# Patient Record
Sex: Female | Born: 1940 | Race: White | Hispanic: No | Marital: Married | Smoking: Never smoker
Health system: Southern US, Community
[De-identification: ages and names within clinical notes are randomized; demographics above are authoritative.]

## PROBLEM LIST (undated history)

## (undated) DIAGNOSIS — Z5189 Encounter for other specified aftercare: Secondary | ICD-10-CM

## (undated) DIAGNOSIS — I639 Cerebral infarction, unspecified: Secondary | ICD-10-CM

## (undated) DIAGNOSIS — D649 Anemia, unspecified: Secondary | ICD-10-CM

## (undated) DIAGNOSIS — K5790 Diverticulosis of intestine, part unspecified, without perforation or abscess without bleeding: Secondary | ICD-10-CM

## (undated) DIAGNOSIS — K219 Gastro-esophageal reflux disease without esophagitis: Secondary | ICD-10-CM

## (undated) DIAGNOSIS — N189 Chronic kidney disease, unspecified: Secondary | ICD-10-CM

## (undated) DIAGNOSIS — M1990 Unspecified osteoarthritis, unspecified site: Secondary | ICD-10-CM

## (undated) DIAGNOSIS — C801 Malignant (primary) neoplasm, unspecified: Secondary | ICD-10-CM

## (undated) DIAGNOSIS — Z85528 Personal history of other malignant neoplasm of kidney: Secondary | ICD-10-CM

## (undated) HISTORY — PX: COLONOSCOPY: SHX174

## (undated) HISTORY — PX: ABDOMINAL HYSTERECTOMY: SUR658

## (undated) HISTORY — DX: Gastro-esophageal reflux disease without esophagitis: K21.9

## (undated) HISTORY — PX: FOOT SURGERY: SHX648

## (undated) HISTORY — PX: CATARACT EXTRACTION, BILATERAL: SHX1313

## (undated) HISTORY — DX: Personal history of other malignant neoplasm of kidney: Z85.528

## (undated) HISTORY — PX: COLON SURGERY: SHX602

## (undated) HISTORY — DX: Diverticulosis of intestine, part unspecified, without perforation or abscess without bleeding: K57.90

## (undated) HISTORY — PX: HIP SURGERY: SHX245

## (undated) HISTORY — DX: Malignant (primary) neoplasm, unspecified: C80.1

## (undated) HISTORY — PX: OTHER SURGICAL HISTORY: SHX169

## (undated) HISTORY — DX: Encounter for other specified aftercare: Z51.89

## (undated) HISTORY — PX: POLYPECTOMY: SHX149

---

## 1981-06-14 DIAGNOSIS — C801 Malignant (primary) neoplasm, unspecified: Secondary | ICD-10-CM

## 1981-06-14 HISTORY — DX: Malignant (primary) neoplasm, unspecified: C80.1

## 1999-07-10 ENCOUNTER — Encounter: Payer: Self-pay | Admitting: Urology

## 1999-07-10 ENCOUNTER — Encounter: Admission: RE | Admit: 1999-07-10 | Discharge: 1999-07-10 | Payer: Self-pay | Admitting: Urology

## 1999-08-10 ENCOUNTER — Encounter: Payer: Self-pay | Admitting: Specialist

## 1999-08-10 ENCOUNTER — Encounter: Admission: RE | Admit: 1999-08-10 | Discharge: 1999-08-10 | Payer: Self-pay | Admitting: Specialist

## 2000-07-22 ENCOUNTER — Encounter: Admission: RE | Admit: 2000-07-22 | Discharge: 2000-07-22 | Payer: Self-pay | Admitting: Urology

## 2000-07-22 ENCOUNTER — Encounter: Payer: Self-pay | Admitting: Urology

## 2003-10-23 ENCOUNTER — Inpatient Hospital Stay (HOSPITAL_COMMUNITY): Admission: EM | Admit: 2003-10-23 | Discharge: 2003-10-26 | Payer: Self-pay

## 2004-08-19 ENCOUNTER — Encounter: Admission: RE | Admit: 2004-08-19 | Discharge: 2004-09-21 | Payer: Self-pay | Admitting: Family Medicine

## 2005-09-23 ENCOUNTER — Ambulatory Visit: Payer: Self-pay | Admitting: Gastroenterology

## 2005-09-23 ENCOUNTER — Ambulatory Visit: Payer: Self-pay | Admitting: Cardiology

## 2005-10-01 ENCOUNTER — Ambulatory Visit (HOSPITAL_COMMUNITY): Admission: RE | Admit: 2005-10-01 | Discharge: 2005-10-01 | Payer: Self-pay | Admitting: Gastroenterology

## 2005-10-05 ENCOUNTER — Ambulatory Visit: Payer: Self-pay | Admitting: Gastroenterology

## 2005-11-11 ENCOUNTER — Inpatient Hospital Stay (HOSPITAL_COMMUNITY): Admission: RE | Admit: 2005-11-11 | Discharge: 2005-11-19 | Payer: Self-pay | Admitting: Urology

## 2005-11-11 ENCOUNTER — Encounter (INDEPENDENT_AMBULATORY_CARE_PROVIDER_SITE_OTHER): Payer: Self-pay | Admitting: Specialist

## 2005-11-26 ENCOUNTER — Encounter (INDEPENDENT_AMBULATORY_CARE_PROVIDER_SITE_OTHER): Payer: Self-pay | Admitting: *Deleted

## 2005-11-26 ENCOUNTER — Ambulatory Visit (HOSPITAL_COMMUNITY): Admission: RE | Admit: 2005-11-26 | Discharge: 2005-11-26 | Payer: Self-pay | Admitting: Surgery

## 2005-12-08 ENCOUNTER — Encounter: Admission: RE | Admit: 2005-12-08 | Discharge: 2005-12-08 | Payer: Self-pay | Admitting: Surgery

## 2006-08-23 ENCOUNTER — Ambulatory Visit (HOSPITAL_BASED_OUTPATIENT_CLINIC_OR_DEPARTMENT_OTHER): Admission: RE | Admit: 2006-08-23 | Discharge: 2006-08-23 | Payer: Self-pay | Admitting: Orthopedic Surgery

## 2007-12-18 ENCOUNTER — Ambulatory Visit: Payer: Self-pay | Admitting: Vascular Surgery

## 2009-01-02 ENCOUNTER — Ambulatory Visit: Payer: Self-pay | Admitting: Gastroenterology

## 2009-01-22 ENCOUNTER — Ambulatory Visit: Payer: Self-pay | Admitting: Gastroenterology

## 2010-01-20 LAB — HM PAP SMEAR: HM Pap smear: NORMAL

## 2010-05-27 LAB — HM MAMMOGRAPHY: HM Mammogram: NORMAL

## 2010-07-16 NOTE — Procedures (Signed)
Summary: Colonoscopy   Colonoscopy  Procedure date:  01/22/2009  Findings:      Location:  [facility].    Procedures Next Due Date:    Colonoscopy: 01/2014 COLONOSCOPY PROCEDURE REPORT  PATIENT:  [name], [name]  MR#:  [id number] BIRTHDATE:   January 15, 1941, 68 yrs. old   GENDER:   female  ENDOSCOPIST:   Vania Rea. Jarold Motto, MD, [facility] Referred by:   PROCEDURE DATE:  01/22/2009 PROCEDURE:  Colonoscopy, diagnostic ASA CLASS:   Class II INDICATIONS: history of polyps PRIOR SIGMOID RESECTION FOR RECURRENT DIVERTICULITIS.  MEDICATIONS:    Fentanyl 50 mcg IV, Versed 5 mg IV  DESCRIPTION OF PROCEDURE:   After the risks benefits and alternatives of the procedure were thoroughly explained, informed consent was obtained.  Digital rectal exam was performed and revealed no abnormalities.   The LB [id number] endoscope was introduced through the anus and advanced to the cecum, which was identified by both the appendix and ileocecal valve, without limitations.  The quality of the prep was excellent, using MoviPrep.  The instrument was then slowly withdrawn as the colon was fully examined. <<PROCEDUREIMAGES>>        <<OLD IMAGES>>  FINDINGS:  Scattered diverticula were found in the sigmoid colon.  No polyps or cancers were seen.  This was otherwise a normal examination of the colon.   Retroflexed views in the rectum revealed hypertrophied anal papillae.    The scope was then withdrawn from the patient and the procedure completed.  COMPLICATIONS:   None  ENDOSCOPIC IMPRESSION:  1) Diverticula, scattered in the sigmoid colon  2) No polyps or cancers  3) Otherwise normal examination  4) Hypertrophied anal papillae RECOMMENDATIONS:  1) high fiber diet  2) Follow up colonoscopy in 5 years  REPEAT EXAM:   No   _______________________________ Vania Rea. Jarold Motto, MD, [facility]  CC: Harriette Bouillon, MDKevin Clarene Duke, MD

## 2010-07-16 NOTE — Miscellaneous (Signed)
Summary: LecPrevisit/prep  Clinical Lists Changes  Medications: Added new medication of MOVIPREP 100 GM  SOLR (PEG-KCL-NACL-NASULF-NA ASC-C) As per prep instructions. - Signed Rx of MOVIPREP 100 GM  SOLR (PEG-KCL-NACL-NASULF-NA ASC-C) As per prep instructions.;  #1 x 0;  Signed;  Entered by: Wyona Almas RN;  Authorized by: Mardella Layman MD [facility];  Method used: Electronically to [facility] [id number] [place], [place], [place]  [address], Ph: [phone], Fax: [phone] Observations: Added new observation of NKA: T (01/02/2009 13:12)    Prescriptions: MOVIPREP 100 GM  SOLR (PEG-KCL-NACL-NASULF-NA ASC-C) As per prep instructions.  #1 x 0   Entered by:   Wyona Almas RN   Authorized by:   Mardella Layman MD [facility]   Signed by:   Wyona Almas RN on 01/02/2009   Method used:   Electronically to        [facility] [phone]* (retail)       [address] [place], [place]  [address]       Ph: [phone]       Fax: [phone]   RxID:   [id number]

## 2010-10-20 ENCOUNTER — Telehealth: Payer: Self-pay | Admitting: Gastroenterology

## 2010-10-20 NOTE — Telephone Encounter (Signed)
Pt with hx of Diverticulitis and Sigmoid Resection r/t Diverticulitis 11/11/2005. Last COLON 01/22/2009. Pt reports left side abdominal pain that started last Thur. or Fri. Pain is not as bad today, but she had a good BM and she doesn't know if her problem has gone away. Suggested to pt she needs to be seen d/t her hx . Pt will see Willette Cluster, NP tomorrow at 09:00am.

## 2010-10-21 ENCOUNTER — Ambulatory Visit (INDEPENDENT_AMBULATORY_CARE_PROVIDER_SITE_OTHER): Payer: Medicare Other | Admitting: Nurse Practitioner

## 2010-10-21 ENCOUNTER — Encounter: Payer: Self-pay | Admitting: Nurse Practitioner

## 2010-10-21 DIAGNOSIS — Z8619 Personal history of other infectious and parasitic diseases: Secondary | ICD-10-CM

## 2010-10-21 DIAGNOSIS — R1032 Left lower quadrant pain: Secondary | ICD-10-CM

## 2010-10-21 DIAGNOSIS — R109 Unspecified abdominal pain: Secondary | ICD-10-CM

## 2010-10-21 DIAGNOSIS — Z8719 Personal history of other diseases of the digestive system: Secondary | ICD-10-CM | POA: Insufficient documentation

## 2010-10-21 DIAGNOSIS — R1031 Right lower quadrant pain: Secondary | ICD-10-CM | POA: Insufficient documentation

## 2010-10-21 MED ORDER — SACCHAROMYCES BOULARDII 250 MG PO CAPS: 250.0000 mg | ORAL_CAPSULE | Freq: Every day | ORAL | Status: DC

## 2010-10-21 MED ORDER — CIPROFLOXACIN HCL 500 MG PO TABS: 500.0000 mg | ORAL_TABLET | Freq: Two times a day (BID) | ORAL | Status: AC

## 2010-10-21 MED ORDER — METRONIDAZOLE 500 MG PO TABS: 500.0000 mg | ORAL_TABLET | Freq: Three times a day (TID) | ORAL | Status: AC

## 2010-10-21 NOTE — Patient Instructions (Signed)
We have sent the prescription for Cipro, Flagyl, and Florastor to [facility]. Call us in 10 days with a progress report.

## 2010-10-21 NOTE — Progress Notes (Signed)
10/21/2010 [name] [id number] 1940-11-12   History of Present Illness:  [name] is a 70 year old female known to Dr. Jarold Motto for family history of colorectal cancer, she had a colon resection for recurrent diverticulitis in 2007 and her postoperative course was complicated by C-Difficile colitis. She also has a history of renal carcinoma status post right nephrectomy. Patient was last seen at the time of her surveillance colonoscopy August 2010 which of note was an unremarkable exam except for some scattered sigmoid diverticula. Her surveillance exam will be in 2015.  Patient was worked in today after she called the office 10/20/2010 with a several day history of left lower abdominal pain which has since become more diffuse across her lower abdomen.  Sunday was her worst day .Pain not quite as severe over last two days but she still hurts. Laying down helps  Doubts fever. No nausea or vomiting. Bowel movements are normal. She uses Citrucels and prunes and that keeps her regular. No urinary symptoms.     Past Medical History  Diagnosis Date  . Personal history of kidney cancer   . Diverticulosis    Past Surgical History  Procedure Date  . Hip surgery   . Abdominal hysterectomy   . Kidney cancer     Kidney removed  . Colon surgery     Severe sigmoid diverticulitis  . Foot surgery     reports that she has never smoked. She does not have any smokeless tobacco history on file. She reports that she does not drink alcohol. Her drug history not on file. family history includes Coronary artery disease in her father. No Known Allergies      Outpatient Encounter Prescriptions as of 10/21/2010  Medication Sig Dispense Refill  . aspirin 81 MG tablet Take 81 mg by mouth daily.        Marland Kitchen CALCIUM & MAGNESIUM CARBONATES PO Take by mouth. 2 daily        . estradiol (VIVELLE-DOT) 0.05 MG/24HR Place 1 patch onto the skin once a week.        Marland Kitchen glucosamine-chondroitin 500-400 MG tablet Take 1  tablet by mouth 2 (two) times daily.        . Multiple Vitamins-Minerals (MULTIVITAMIN WITH MINERALS) tablet Take 1 tablet by mouth daily.           ROS: All other systems reviewed and negative except where noted in the History of Present Illness.   Physical Exam: General: Well developed, white female in no acute distress Head: Normocephalic and atraumatic Eyes:  sclerae anicteric,conjunctive pink. Ears: Normal auditory acuity Mouth: No deformity or lesions Neck: Supple, no masses.  Lungs: Clear throughout to auscultation Heart: Regular rate and rhythm; no murmurs heard Abdomen: Soft, non distended, mild diffuse lower abdominal tenderness. No masses or hepatomegaly noted. Normal Bowel sounds Rectal: Not done Musculoskeletal: Symmetrical with no gross deformities  Skin: No lesions on visible extremities Extremities: No edema or deformities noted Neurological: Alert oriented x 4, grossly nonfocal Cervical Nodes:  No significant cervical adenopathy Psychological:  Alert and cooperative. Normal mood and affect  Assessment and Plan:  1.  Lower abdominal pain: Several day history of lower abdominal pain, initially left lower quadrant, now more diffuse across the lower abdomen. Pain reminiscent of diverticulitis for which she is status post partial colectomy 2007. While the patient has minimal lower abdominal tenderness on exam, I am concerned that this may be recurrent diverticulitis. Given her history of C. difficile colitis I have some concerns about  treating her empirically with antibiotics. Patient prefers to avoid a CT scan at this time for financial reasons. We did discuss the possibility of recurrent C. difficile colitis but at this point [name] would like to proceed with treatment. She'll be treated with a ten-day course of Flagyl and Cipro. Also recommended 30 days of Florastor. Patient will call us in 10 days with a condition update, or if sooner should pain worsen or she  develops fevers.

## 2010-10-22 NOTE — Progress Notes (Signed)
Agree with Ms. Guenther's assessment and plan. Leonila Speranza E. Jilliann Subramanian, MD, FACG   

## 2010-10-27 NOTE — Procedures (Signed)
LOWER EXTREMITY VENOUS REFLUX EXAM   INDICATION:  Left leg varicose vein with pain and bleeding.   EXAM:  Using color-flow imaging and pulse Doppler spectral analysis, the  left common femoral, superficial femoral, popliteal, posterior tibial,  greater and lesser saphenous veins are evaluated.  There is no evidence  suggesting deep venous insufficiency in the left lower extremity.   The left saphenofemoral junction is competent.  The left GSV is  competent with the caliber as described below.   The left proximal short saphenous vein demonstrates competency.   GSV Diameter (used if found to be incompetent only)                                            Right    Left  Proximal Greater Saphenous Vein           cm       cm  Proximal-to-mid-thigh                     cm       cm  Mid thigh                                 cm       cm  Mid-distal thigh                          cm       cm  Distal thigh                              cm       cm  Knee                                      cm       cm   IMPRESSION:  1. Left greater saphenous vein shows no evidence of reflux.  2. The left greater saphenous vein is not aneurysmal.  3. The left greater saphenous vein is not tortuous.  4. The deep venous system is competent.  5. The left lesser saphenous vein is competent.  6. No evidence of deep venous thrombosis noted in the left leg.   ___________________________________________  Quita Skye. Hart Rochester, M.D.   MG/MEDQ  D:  12/18/2007  T:  12/18/2007  Job:  [id number]

## 2010-10-27 NOTE — Consult Note (Signed)
VASCULAR SURGERY CONSULTATION   JUANISHA, BAUTCH  DOB:  01-06-1941                                       12/18/2007  FTDDU#:[id number]   The patient is a 70 year old female referred for history of a bleeding  varix in the left ankle in the past.  She states that about 1 year ago  while scratching her ankle, significant bleeding occurred from a small  varicose vein near the ankle area and this eventually stopped with some  compression and elevation of the leg.  She has no history of further  bleeding and has had no stasis ulcers, deep venous thrombosis,  thrombophlebitis, pulmonary emboli or other venous problems.  She denies  distal edema and has some mild aching discomfort in the calves  periodically.   PAST MEDICAL HISTORY:  Negative for coronary artery disease, diabetes,  hypertension, COPD or stroke.   PAST SURGICAL HISTORY:  1. Hip surgery.  2. Hysterectomy.  3. Removal of kidney for cancer.  4. Colon surgery.  5. Foot surgery.  She was no more specific than that.   FAMILY HISTORY:  Positive for coronary artery disease in her father.  Negative for diabetes and stroke.   ALLERGIES:  None known.   SOCIAL HISTORY:  Negative for tobacco, alcohol.   PHYSICAL EXAM:  Blood pressure 128/68, heart rate 72, respirations 14.  Generally she is a female patient who is in no apparent distress.  Alert  and oriented x3.  Neck is supple 3+ carotid pulses palpable.  No bruits  are audible.  Neurologic:  Normal.  No palpable adenopathy in the neck.  Chest:  Clear to auscultation.  Cardiovascular:  Regular rhythm with no  murmurs.  Abdomen:  Soft, nontender with no palpable masses.  She has 3+  femoral popliteal, and dorsalis pedis pulses bilaterally.  Left leg has  some diffuse spider veins bilaterally, particularly in the left lateral  thigh with some small varicosities in the medial calf over the greater  saphenous system.  There is a prominent reticular and spider  vein near  the left medial malleolus where the bleeding occurred.  There is no  hyperpigmentation or ulceration noted.  Right leg has some spider veins  along the distal lateral thigh and medial calf area.  No distal edema is  noted in either leg.   Venous duplex exam was performed on the left leg which revealed the deep  system to be normal.  There is no reflux in the small saphenous or great  saphenous vein.  There is a small incompetent perforator communicating  with the varix, which bled 1 year ago near the ankle.   I think her best treatment would be sclerotherapy of this small  varicosity which bled in the past.  I have discussed this with her and  if she would like to proceed with this, she will be in touch with [place] for  arranging that treatment.   Quita Skye Hart Rochester, M.D.  Electronically Signed  JDL/MEDQ  D:  12/18/2007  T:  12/19/2007  Job:  [id number]   cc:   Caryn Bee L. Little, M.D.

## 2010-10-30 NOTE — H&P (Signed)
[name], [name]                          ACCOUNT NO.:  [id number]   MEDICAL RECORD NO.:  [id number]                   PATIENT TYPE:  INP   LOCATION:  0104                                 FACILITY:  [facility]   PHYSICIAN:  Vania Rea. Jarold Motto, M.D. [facility]        DATE OF BIRTH:  Apr 05, 1941   DATE OF ADMISSION:  10/23/2003  DATE OF DISCHARGE:                                HISTORY & PHYSICAL   CHIEF COMPLAINT:  Acute abdominal pain post colonoscopy with subacute  abdominal pain x3-4 months.   HISTORY:  [name] is a very nice, 70 year old white female, primary patient  of Dr. Catha Gosselin who was referred to Dr. Jarold Motto for a colon screening  with a family history of colon cancer in her mother.  The patient had also  been having some intermittent left lower quadrant pain and gas over the past  several months. She said she initially had an episode around Christmas time  with some sharp, lower abdominal pains radiating towards her vagina.  She  says that she took laxatives at that time and eventually her pain improved  and then she had a recurrence a few weeks ago with left lower quadrant  abdominal pain.  Again, this seemed to resolve without any specific  treatment; and, at the time this morning, prior to colonoscopy she was not  having any abdominal discomfort and said that she had been feeling okay for  the past couple of weeks.  Her bowel movements have been fairly normal with  tendency towards constipation.  She has not noted any melena or  hematochezia, not had any fever or chills. Her appetite has been fine and  her weight has been stable.   At colonoscopy she was found to have marked diverticular disease with  evidence of diverticulitis in the left colon.  She had a small right colon  polyp excised as well.  Postprocedure she is complaining of severe abdominal  discomfort rather diffusely and is therefore, evaluated in the ER and then  admitted to the hospital with concerns for  acute diverticulitis.   CURRENT MEDICATIONS:  1. Aspirin 81 mg p.o. daily.  2. Premarin 0.625 daily.  3. Clorazepate 7.5 p.r.n.  4. Multivitamin daily.  5. Calcium 600 daily.  6. Folic acid 400 daily.  7. Glucosamine b.i.d.   ALLERGIES:  No known drug allergies.   PAST MEDICAL HISTORY:  1. The patient is status post right nephrectomy 23 years ago for a renal     cell carcinoma.  Surgery was done per Dr. Vonita Moss.  2. Status post remote left hip fracture at age 70.  3. Status post total hysterectomy in the 26s.  4. Status post bilateral tubal ligation.   FAMILY HISTORY:  Pertinent for colon cancer in the patient's mother,  diagnosed in her 43s.   SOCIAL HISTORY:  The patient is retired, has been married for 45 years.  No  tobacco and no EtOH .  REVIEW OF SYSTEMS:  CARDIOVASCULAR:  Negative for chest pain or anginal  symptoms. PULMONARY:  Negative for cough, shortness of breath or sputum  production.  GENITOURINARY:  Denies any dysuria, urgency or frequency.  GI:  As outlined above.   PHYSICAL EXAMINATION:  GENERAL:  A well-developed, white female. She is  uncomfortable, but in no acute distress. At the time of my evaluation.  VITAL SIGNS:  Temperature is 97, blood pressure 147/68.  Pulse is 55,  respirations 28.  HEENT:  Nontraumatic, normocephalic.  EOMI.  PERLA.  Sclerae anicteric.  CARDIOVASCULAR:  Regular rate and rhythm with S1 and S2, no murmur, rub, or  gallop.  PULMONARY:  Clear to A&P.  ABDOMEN:  Soft.  Bowel sounds are active.  She is currently tender in the  left midquadrant, left lower quadrant and suprapubic area.  There is no  guarding or rebound.  No mass or hepatosplenomegaly.  RECTAL:  Exam not done.  Patient had colonoscopy this a.m.  EXTREMITIES:  Without clubbing, cyanosis, or edema.  NEUROLOGIC:  Grossly nonfocal.   LABORATORY STUDIES:  Pending.   IMPRESSION:  72. A 70 year old white female with acute and subacute abdominal pain, post      colonoscopy this morning with finding of diverticulitis and the single     polyp which was excised from the right colon.  It is felt that her pain     is most consistent with acute diverticulitis; also rule out free air.  2. Status post right nephrectomy, remote, for renal cell carcinoma.  3. Status post total abdominal hysterectomy.  4. Status post left hip fracture.   PLAN:  The patient is admitted to the service of Dr. Sheryn Bison for IV  fluid hydration, baseline labs. She will be covered with IV Cipro and IV  Flagyl.  We will obtain plain abdominal films and then CT scan of the  abdomen and pelvis today; for further details, please see the orders.     Mike Gip, P.A.-C. [facility]                Vania Rea. Jarold Motto, M.D. [facility]    AE/MEDQ  D:  10/23/2003  T:  10/23/2003  Job:  [id number]   cc:   Caryn Bee L. Little, M.D.  [address]  [place]  [place] [address]  Fax: [phone]

## 2010-10-30 NOTE — Discharge Summary (Signed)
[name], [name]                ACCOUNT NO.:  [id number]   MEDICAL RECORD NO.:  [id number]          PATIENT TYPE:  INP   LOCATION:  1515                         FACILITY:  [facility]   PHYSICIAN:  Maisie Fus A. Cornett, M.D.DATE OF BIRTH:  [birth year]   DATE OF ADMISSION:  11/11/2005  DATE OF DISCHARGE:  11/19/2005                                 DISCHARGE SUMMARY   ADMISSION DIAGNOSIS:  Diverticulitis   DISCHARGE DIAGNOSIS:  Same  plus Clostridium diff colitis.   BRIEF HISTORY:  The patient is a 69 year old white female with a history of  multiple bouts of diverticulitis.  She was admitted for elective sigmoid  colectomy.   The patient underwent a sigmoid left colectomy laparoscopic assisted on  11/11/05.  She was admitted to the hospital.  Her postoperative course was  complicated by urinary retention requiring placement of a Foley catheter on  postoperative day #3.  She also had diarrhea and fever, elevated white count  consistent with C. diff colitis. She was placed on Flagyl at this point in  time.  She also had a wound infection .  After these  issues resolved, she  did well.  Her ileus resolved and  diarrhea improved.  She was discharged  home on 11/19/05 in satisfactory conditionon ten days of flagyl and wet to  dry dressing changes for her wound.   DISCHARGE INSTRUCTIONS:  The patient will take a total of  10 days of Flagyl  500  mg p.o. t.i.d.  She will be given Tylox for pain.  Will follow up with  me in 1 to 2 weeks.  She will continue with wet to dry dressing changes to  her midline incision.  Her diet will be as before.  Discharged satisfactory.      Thomas A. Cornett, M.D.  Electronically Signed     TAC/MEDQ  D:  12/08/2005  T:  12/08/2005  Job:  [id number]

## 2010-10-30 NOTE — Discharge Summary (Signed)
[name], [name]                          ACCOUNT NO.:  [id number]   MEDICAL RECORD NO.:  [id number]                   PATIENT TYPE:  INP   LOCATION:  0454                                 FACILITY:  [facility]   PHYSICIAN:  [name]. [name], M.D. [facility]             DATE OF BIRTH:  1940/08/08   DATE OF ADMISSION:  10/23/2003  DATE OF DISCHARGE:  10/26/2003                                 DISCHARGE SUMMARY   ADMISSION DIAGNOSES:  28. A 70 year old white female with acute and subacute abdominal pain status     post colonoscopy on the morning of admission with finding of     diverticulitis and a single polyp of the right colon which was removed.     Pain felt secondary to acute diverticulitis rule out colonic perforation.  2. Status post right nephrectomy remote for renal cell cancer.  3. Status post total abdominal hysterectomy.  4. Status post left hip fracture.   DISCHARGE DIAGNOSES:  1. Acute sigmoid diverticulitis improving.  2. Small volume hematochezia secondary to above.  3. Status post right nephrectomy remote for renal cell cancer.  4. Status post total abdominal hysterectomy.  5. Status post left hip fracture.   PROCEDURES:  CT scan of the abdomen and pelvis and plain abdominal films.   BRIEF HISTORY:  [name] is a very nice 70 year old white female, a primary  patient of Dr. Catha [name], who was referred to Dr. Sheryn [name] for  colon screening given family history of colon cancer in her mother.  She had  also been having some intermittent left lower quadrant pain and gassiness  over the past couple of months.  She said initially she had an episode  around Christmas time with some sharp lower abdominal pains radiating  towards her vagina.  She says she had taken laxatives and eventually her  pain improved and then she had a recurrence a few weeks ago this time with  left lower quadrant abdominal pain.  This again seemed to resolve without  any specific treatment and prior to  colonoscopy today she was not having any  acute abdominal discomfort.  She said her bowel movements had been fairly  normal and had not noted any melena or hematochezia.   At colonoscopy, she was found to have a marked diverticular disease with  evidence of diverticulitis in the left colon.  She also had a small right  colon polyp which was removed.  Postprocedure, she was complaining of severe  abdominal pain rather diffusely and therefore was sent to the emergency room  for evaluation and admission with concerns with acute diverticulitis and  also to rule out free air.   LABORATORY STUDIES:  On May 11th WBCs 6.6, hemoglobin 13.3, hematocrit of  39.5, MCV of 88.4, platelets 332.  Serial CBCs were done.  On the 14th, WBCs  6.2, hemoglobin 12, hematocrit of 35.2, sed rate was 14, pro time  11.4, INR  of 0.8, electrolytes within normal limits.  Glucose was 121 on admission.  Albumin 3.5.  Liver function studies normal.  Urinalysis negative.  EKG  showed a sinus bradycardia with a pulse of 56, otherwise unremarkable.   KUB on admission showed no ileus, obstruction, or free air.  Chest x-ray  negative.  CT scan of the abdomen and pelvis on May 11th status post  hysterectomy, some pericolic stranding in the rectosigmoid junction  consistent with mild diverticulitis, small amount of free fluid in the  pelvic cul-de-sac, no sign of abscess or free air.   HOSPITAL COURSE:  The patient was admitted to the service of Dr. Sheryn [name].  She was initially kept NPO, placed on IV fluids, given Demerol  and Phenergan as needed for control of pain and started on IV Cipro and IV  Flagyl.  Plain abdominal films were negative for free air and CT scan was  most consistent with a sigmoid diverticulitis.  She passed a small amount of  bright red blood on the day of admission.  It was felt this was likely due  to the diverticulitis and irritation with intubation of the colon.  She had  a couple of other  somewhat bloody stools later in the evening.  By the  following day, her symptoms were improved.  Pain was much better and she was  continued on antibiotics.  We began advancing her diet slowly.  On the 13th,  she remained stable.  Had passed a [name] mucousy blood again but hemoglobin  remained stable.  By May 14th, again she was feeling much better, had no  further complaints of abdominal pain, had had a more normal-appearing stool  and was feeling much more comfortable.  She was allowed discharge to home  with instructions to continue Cipro 500 mg b.i.d. for 10 days, Flagyl 250 mg  q.i.d. for 10 days, Darvocet-N 100 one every 4 hours as needed for pain.  She was asked to avoid aspirin and aspirin products and NSAIDS for the next  2 weeks, to resume her other medications as previous which included Premarin  0.625 daily, clorazepate 7.5 mg p.r.n., a multivitamin, calcium and folic  acid supplements, and was asked to maintain a low-residue diet for 2 weeks  or full liquid diet for 24 more hours and then soft, low-residue diet for 2  weeks.  She as asked to avoid strenuous exercise or heavy lifting and to  follow up with Dr. Jarold [name] in the office on Friday, May 20th, at 9:45 a.m.  She was to call in the interim for any problems.     [name], P.A.-C. [facility]                [name], M.D. [facility]    AE/MEDQ  D:  11/14/2003  T:  11/15/2003  Job:  [id number]   cc:   [name], M.D. [facility]   [name], M.D.  [address]  [place]  [place] [address]  Fax: [phone]

## 2010-10-30 NOTE — Op Note (Signed)
[name], [name]                ACCOUNT NO.:  [id number]   MEDICAL RECORD NO.:  [id number]          PATIENT TYPE:  AMB   LOCATION:  DSC                          FACILITY:  [facility]   PHYSICIAN:  Leonides Grills, M.D.     DATE OF BIRTH:  [birth year]   DATE OF PROCEDURE:  08/23/2006  DATE OF DISCHARGE:                               OPERATIVE REPORT   PREOPERATIVE DIAGNOSES:  1. Left second metatarsalgia.  2. Left second hammertoe.  3. Left tight gastrocnemius.   POSTOPERATIVE DIAGNOSES:  1. Left second metatarsalgia.  2. Left second hammertoe.  3. Left tight gastrocnemius.   OPERATION:  1. Left second Weil metatarsal shortening osteotomy.  2. Stress x-rays, left foot.  3. Left gastrocnemius slide.  4. Left second toe metatarsophalangeal joint dorsal capsulotomy with      collateral release.  5. Left second toe proximal phalanx head resection.  6. Left second toe extensor digitorum brevis to extensor digitorum      longus tendon transfer.   ANESTHESIA:  General.   SURGEON:  Leonides Grills, MD   ASSISTANT:  Evlyn Kanner, PA-C.   ESTIMATED BLOOD LOSS:  Minimal.   TOURNIQUET TIME:  Approximately 40 minutes.   COMPLICATIONS:  None.   DISPOSITION:  Stable, to PR.   INDICATIONS:  This is a 70 year old female who has had longstanding left  forefoot pain that was interfering with her life to the point that she  could __________ and she consented and seen by the anesthesiologist.  Risks including infection, nerve or vessel injury, persistent pain,  worsening pain, prolonged recovery, stiffness, arthritis, cock-up toe  deformity, nonunion, malunion, hardware rotation, hardware failure and  prolonged recovery were all explained.  Questions were encouraged and  answered.   DESCRIPTION OF PROCEDURE:  The patient was brought to the operating room  and placed in a supine position.  After adequate general tube anesthesia  was administered as well as Ancef 1 g IV piggyback, the left  lower  extremity was then prepped and draped in a sterile manner with a  proximally placed thigh tourniquet.  We started procedure with a  longitudinal incision over the medial aspect of the gastrocnemius  musculotendinous junction.  Dissection was carried down through the skin  and hemostasis was obtained.  The fascia was opened in line with the  incision.  The conjoined region was then developed between the gastroc  and soleus.  Soft tissue was then elevated off the posterior aspect the  gastrocnemius.  Sural nerves were identified and protected posteriorly.  The gastrocnemius was then released with a curved Mayo scissors; this  had an excellent release of the tight gastroc.  The area was copiously  irrigated with normal saline.  Subcu was closed with 3-0 Vicryl.  Skin  was closed with 4-0 Monocryl as a subcuticular stitch.  Steri-Strips  were applied.  Limb was gravity-exsanguinated and tourniquet was  elevated to 290 mmHg.  A longitudinal incision on the dorsal aspect of  the left 2nd toe was then made.  Dissection was carried down through the  skin and hemostasis was obtained.  EDL and EDB tendons were identified  with the EDL tenotomized proximal and medial and brevis distolateral for  later transfer.  The distal aspect of the proximal phalanx was  skeletonized and the head was then removed with a rongeur followed by a  bone cutter; these cuts were made perpendicular to the long axis of the  proximal phalanx.  At the MTP joint, a dorsal capsulotomy with  collateral release was then performed with the 15 blade scalpel,  protecting the soft tissues both medially and laterally including  neurovascular structures.  We then placed a 0.062 K-wire antegrade  through middle and distal phalanx after the PIP joint area had been  copiously irrigated with normal saline.  We reduced the PIP joint and  then fired this retrograde into the proximal phalanx.  The MTP joint and  toe were held in a  reduced position.  We then plantarflexed the toe and  made an osteotomy parallel to plantar aspect of the foot through the  distal aspect of the 2nd metatarsal head as described by Gerhard Munch; this was  with stacked sagittal saw blades.  The head was then translated  approximately 3 mm proximally and then affixed with a 2.1-mm fully  threaded cortical set screw using a 1.1-mm drill hole, respectively.  This had excellent purchase and maintenance of the correction.  This was  countersunk.  The redundant bone wedge dorsally was trimmed off with a  rongeur.  Bone wax was applied to exposed bone surfaces.  The area was  copiously irrigated with normal saline.  The toe was then held in  reduced position and a 0.062 K-wire was then fired across the MTP joint.  This held the toe in excellent position.  We then transferred the EDB to  EPL tendon dorsally using 3-0 PDS stitch; this had an outstanding  repair.  The tourniquet was deflated and hemostasis was obtained.  Skin-  relieving incisions were made on either side of the K-wire.  Final  stress x-rays were obtained in AP and lateral planes showing no gross  motion across the fusion site, fixation in proper position and alignment  excellent as well.  The length of the metatarsal was excellent.  The toe  pinked up nicely.  K-wires were bent, cut and capped.  Again, wounds  were copiously with normal saline.  Skin was closed with 4-0 nylon  stitch.  A sterile dressing was applied and a  Cam walker boot was  applied.  The patient was stable to the PR.   POSTOPERATIVE COURSE:  The patient will follow up in 2 weeks.  At that  time, we will remove the dressing as well as suture.  She will then go  into a hard sole shoe and heel weightbear for an additional 4 weeks.  At  that point, the K-wire will be removed from the 2nd toe and then she  will be advanced weightbearing as tolerated in the hard sole shoe for 2 additional weeks.  At 2 months postop, we will  have her go into a normal  shoe.  Once the sutures are removed from the 2nd toe, she is to perform  skin mobilization over the dorsal aspect of the 2nd toe and once the K-  wires are removed from the 2nd toe, she is to undergo plantarflexion  stretching exercises to the toe as well.  Elevation is encouraged.  She  is to perform pin care with a Q-tip and peroxide b.i.d.  Leonides Grills, M.D.  Electronically Signed     PB/MEDQ  D:  08/23/2006  T:  08/25/2006  Job:  [id number]

## 2010-10-30 NOTE — Op Note (Signed)
[name], [name]                ACCOUNT NO.:  [id number]   MEDICAL RECORD NO.:  [id number]          PATIENT TYPE:  INP   LOCATION:  1515                         [facility]ACILITY:  [facility]   PHYSICIAN:  [name], M.D.DATE OF BIRTH:  [birth year]   DATE OF PROCEDURE:  11/11/2005  DATE OF DISCHARGE:                                 OPERATIVE REPORT   PREOPERATIVE DIAGNOSIS:  Severe sigmoid diverticulitis.   POSTOPERATIVE DIAGNOSIS:  Severe sigmoid diverticulitis.   PROCEDURE:  1.  Laparoscopic assisted sigmoid colectomy.  2.  Take down of splenic flexure.   SURGEON:  [name], M.D.   ASSISTANT:  [name], M.D.   ANESTHESIA:  General endotracheal anesthesia.   DRAINS:  None.   ESTIMATED BLOOD LOSS:  150 mL.   SPECIMEN:  Sigmoid colon to pathology.   INDICATIONS FOR PROCEDURE:  The patient is a 70 year old female who has had  severe bouts of diverticulitis.  These have been coming quite frequently and  she has had numerous attacks since Christmas.  After a discussion with the  patient about her options, surgery was recommended.  She has had more than  four attacks, and two in the last six months have caused significant  treatment.  She wanted to have this done and had significant sigmoid  diverticulosis and diverticulitis on her barium enema.  I discussed the  procedure with her, its potential complications of bleeding, infection,  bowel injury, anastomotic leak, potential for ostomy, damage to ureter and  kidney.  She understood and agreed to proceed.   DESCRIPTION OF PROCEDURE:  The patient was brought to the operating room and  placed supine.  After induction of general endotracheal anesthesia, the  abdomen is prepped and draped in a sterile fashion.  She is placed in  lithotomy.  A Foley catheter and orogastric tube were both placed without  difficulty.  She received preoperative antibiotics.  Care was taken to place  the patient in lithotomy with no  pressure on the joints or knees.  Next, a  supraumbilical incision was made after sterile prep and drape.  Dissection  was carried to her fascia, the fascia was grasped with Kochers and opened  with a scalpel blade.  I placed my finger into the preperitoneal space and  swept around.  I did not feel any adhesions.  A pursestring suture of 0  Vicryl was placed and an 11-mm Hassan cannula was placed in direct vision.  Pneumoperitoneum was created 15 mmHg CO2.  The laparoscope was then placed.  Laparoscopy was performed.  She had no intra-abdominal adhesions from  previous surgery.  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  Next, I placed  three 5 mm ports, one in the epigastrium under direct vision, the second was  placed in the patient's midline between the umbilicus and pubic symphysis,  the third was placed in the patient's left lower quadrant.  With these  ports, I was then able to identify the colon.  There is significant chronic  inflammatory change and the sigmoid colon was very densely adherent to the  left pelvic sidewall.  I started above this area with the Harmonic scalpel  and began to mobilize left colon after rolling the patient to her right and  placing her neutral.  The white line of Toldt was mobilized with the  Harmonic scalpel.  I took this all the way up to the splenic flexure.  I  then mobilized the splenic flexure just above the spleen in an attempt to  gain length.  She had significant pelvic involvement of her sigmoid colon  and I figured we needed to have as much length as we needed to pull this  into the pelvis.  I was able to take the splenic flexure down carefully  using the Harmonic scalpel with care taken not to injure the colon or small  bowel or spleen in the process.  Once I had the splenic flexure well  mobilized, I then proceeded down toward the pelvis and mobilized the left  colon.  The sigmoid colon was densely adherent to the pelvic sidewall and  great  care was taken to mobilize this.  There was significant chronic  inflammatory change, but no evidence of any abscess or pus.  At this point,  I was able to mobilize the sigmoid colon off the pelvic sidewall and we were  able to see down in the pelvis and we were able to find the junction of the  rectosigmoid.  Once this was adequately mobilized, I felt the remainder of  the case could be done open.  At this point in time, the laparoscopic  instruments were removed.  The CO2 was released.  No irrigation was used and  the gutter and splenic region were found to be hemostatic.  At this point,  once all ports were subsequently removed, the CO2 was released.   Next, a lower midline incision was made that was initially about 7 cm  inbetween the pubic symphysis and the umbilicus.  Dissection was carried  down, the midline was found, the abdominal cavity was entered.  Once  entering the abdominal cavity, there was still significant pelvic  inflammatory change around the sigmoid colon.  I enlarged the incision about  2 cm in either direction.  I then used a Engineer, manufacturing and placed this.  The patient was then placed in Trendelenburg and rolled to her right.  I  packed out the small bowel with lap pads.  We were then able to see the  sigmoid colon.  We had very good mobilization of the proximal sigmoid,  however, in the pelvis there were still some areas that were densely  adhered.  We were able to examine the pelvis.  The area of the ureter was  examined carefully.  We were fairly far away from the ureter but I could  feel it and it appeared to be coursing over the iliac vessels well away from  where we were working.  At this point, we did a little bit more mobilization  of the sigmoid colon to bring it up into the wound better. Once we did this,  we identified her disease.  Most of her disease was distal to the rectosigmoid junction and was proximal to that for about 10 cm.  We found a  nice  soft segment of colon that was fairly diverticula free which was the  distal left colon.  With a GIA-75 stapling device, we divided the bowel.  A  Liga Sure was then used take the mesentery down toward the pelvis.  We  found  an area at the rectosigmoid junction which seemed soft and did not show any  signs of diverticula that was our end point.  We divided it with the GIA-75  stapler and passed the specimen off the field.  There was some bleeding from  the mesentery and this was oversewn with 3-0 Vicryl and 3-0 silk ties.  At  this point in time, an end-to-end two-layer anastomosis was created using an  outer layer of 3-0 silk pop offs and a running layer of 3-0 PDS.  We cut off  both staple lines and placed tacking sutures in the distal rectal stump.  We  then created our anastomosis in the above mentioned fashion.  There was  excellent blood supply from the bowel and no tension whatsoever on the  anastomosis.  Two layers were constructed.  There was an adequate lumen on  examination.  We reinspected the mesentery and irrigated out the pelvis and  suctioned this until clear.  There was no evidence any bleeding.  There was  no evidence of any bleeding coming from the left gutter or splenic flexure.  At this point in time, the packs were removed.  The patient was then  flattened out.  The umbilical fascia was closed with the previously present  placed pursestring suture.  I then closed the fascia with #1 PDS in a  running fashion after assuring the counts were correct.  Staples were used to close all skin incisions at this point in time.  The  wound was irrigated.  Dry dressings were applied.  All final counts of  sponge, needle and instruments were found to be correct.  The patient was  taken out of lithotomy, extubated, and taken to recovery in satisfactory  condition.      Thomas A. [name], M.D.  Electronically Signed     TAC/MEDQ  D:  11/11/2005  T:  11/11/2005  Job:  [id number]    cc:   Vania Rea. Jarold Motto, M.D. [facility]  [address]  [place]  [place] [address]   Anna Genre. Little, M.D.  Fax: [phone]

## 2010-11-04 ENCOUNTER — Telehealth: Payer: Self-pay | Admitting: *Deleted

## 2010-11-04 ENCOUNTER — Telehealth: Payer: Self-pay | Admitting: Nurse Practitioner

## 2010-11-04 NOTE — Telephone Encounter (Signed)
The patient has finished her antibiotics and is doing very well. She feels much better and wanted [place] to know.

## 2010-11-04 NOTE — Telephone Encounter (Signed)
Pam spoke to patient

## 2010-12-15 ENCOUNTER — Encounter: Payer: Self-pay | Admitting: Family Medicine

## 2014-01-25 ENCOUNTER — Encounter: Payer: Self-pay | Admitting: Internal Medicine

## 2014-01-30 ENCOUNTER — Encounter: Payer: Self-pay | Admitting: Internal Medicine

## 2014-03-14 ENCOUNTER — Ambulatory Visit (AMBULATORY_SURGERY_CENTER): Payer: Commercial Managed Care - HMO | Admitting: *Deleted

## 2014-03-14 VITALS — Ht 64.5 in | Wt 155.4 lb

## 2014-03-14 DIAGNOSIS — Z800 Family history of malignant neoplasm of digestive organs: Secondary | ICD-10-CM

## 2014-03-14 MED ORDER — MOVIPREP 100 G PO SOLR: 1.0000 | Freq: Once | ORAL | Status: DC

## 2014-03-14 NOTE — Progress Notes (Signed)
No cpap or home 02 use. ewm Pt states she had a colon with patterson that ended her up in hospital for 3 days with bleeding. Pt has hx of part of the colon being removed due to diverticulitis. ewm No diet pills. ewm No problems with past sedation. ewm emmi video to pt's e mail. ewm

## 2014-04-01 ENCOUNTER — Ambulatory Visit (AMBULATORY_SURGERY_CENTER): Payer: Commercial Managed Care - HMO | Admitting: Internal Medicine

## 2014-04-01 ENCOUNTER — Encounter: Payer: Self-pay | Admitting: Internal Medicine

## 2014-04-01 VITALS — BP 134/68 | HR 61 | Temp 96.2°F | Resp 21 | Ht 64.5 in | Wt 155.0 lb

## 2014-04-01 DIAGNOSIS — D122 Benign neoplasm of ascending colon: Secondary | ICD-10-CM

## 2014-04-01 DIAGNOSIS — D123 Benign neoplasm of transverse colon: Secondary | ICD-10-CM

## 2014-04-01 DIAGNOSIS — D125 Benign neoplasm of sigmoid colon: Secondary | ICD-10-CM

## 2014-04-01 DIAGNOSIS — Z1211 Encounter for screening for malignant neoplasm of colon: Secondary | ICD-10-CM

## 2014-04-01 DIAGNOSIS — Z800 Family history of malignant neoplasm of digestive organs: Secondary | ICD-10-CM

## 2014-04-01 MED ORDER — SODIUM CHLORIDE 0.9 % IV SOLN: 500.0000 mL | INTRAVENOUS | Status: DC

## 2014-04-01 NOTE — Progress Notes (Signed)
Report to PACU, RN, vss, BBS= Clear.  

## 2014-04-01 NOTE — Op Note (Signed)
[facility]  [address] [place], [address]   COLONOSCOPY PROCEDURE REPORT  PATIENT: [name], [name]  MR#: [id number] BIRTHDATE: 1941-01-29 , 32  yrs. old GENDER: female ENDOSCOPIST: Jerene Bears, MD PROCEDURE DATE:  04/01/2014 PROCEDURE:   Colonoscopy with snare polypectomy and Colonoscopy with cold biopsy polypectomy First Screening Colonoscopy - Avg.  risk and is 50 yrs.  old or older - No.  Prior Negative Screening - Now for repeat screening. N/A  History of Adenoma - Now for follow-up colonoscopy & has been > or = to 3 yrs.  N/A  Polyps Removed Today? Yes. ASA CLASS:   Class III INDICATIONS:patient's immediate family history of colon cancer and last colonoscopy completed  5  years ago. MEDICATIONS: Monitored anesthesia care and Propofol 200 mg IV  DESCRIPTION OF PROCEDURE:   After the risks benefits and alternatives of the procedure were thoroughly explained, informed consent was obtained.  The digital rectal exam revealed no rectal mass.   The LB [id number]-H190 [id number]  endoscope was introduced through the anus and advanced to the cecum, which was identified by both the appendix and ileocecal valve. No adverse events experienced. The quality of the prep was good, using MoviPrep  The instrument was then slowly withdrawn as the colon was fully examined.  COLON FINDINGS: Two sessile polyps ranging between 3-48mm in size were found in the transverse colon and ascending colon. Polypectomies were performed with a cold snare.  The resection was complete, the polyp tissue was completely retrieved and sent to histology.   There was evidence of a normal appearing prior surgical anastomosis in the sigmoid colon. At the anastomosis there was a small polyp, 3-4 mm,, query pseudopolyp. A polypectomy was performed with cold forceps.   There was mild diverticulosis noted in the descending colon and at the hepatic flexure.  Retroflexed views revealed small internal hemorrhoids  and hypertrophied anal papilla. The time to cecum=2 minutes 35 seconds.  Withdrawal time=12 minutes 19 seconds.  The scope was withdrawn and the procedure completed.  COMPLICATIONS: There were no immediate complications.      ENDOSCOPIC IMPRESSION: 1.  Two sessile polyps ranging between 3-75mm in size were found in the transverse colon and ascending colon; polypectomies were performed with a cold snare 2.   There was evidence of prior surgical anastomosis in the sigmoid colon with possible polyp; polypectomy was performed with cold forceps 3.   Mild diverticulosis was noted in the descending colon and at the hepatic flexure  RECOMMENDATIONS: 1.  Await pathology results 2.  High fiber diet 3.  Colace (docusate) 100-200 mg can be used daily as needed to help soften stools  eSigned:  Jerene Bears, MD 04/01/2014 10:03 AM   cc: The Patient and Hulan Fess, MD   PATIENT NAME:  [name], [name] MR#: [id number]

## 2014-04-01 NOTE — Patient Instructions (Signed)
Discharge instructions given with verbal understanding. Handouts on polyps and diverticulosis. Resume previous medications. YOU HAD AN ENDOSCOPIC PROCEDURE TODAY AT THE [facility]: Refer to the procedure report that was given to you for any specific questions about what was found during the examination.  If the procedure report does not answer your questions, please call your gastroenterologist to clarify.  If you requested that your care partner not be given the details of your procedure findings, then the procedure report has been included in a sealed envelope for you to review at your convenience later.  YOU SHOULD EXPECT: Some feelings of bloating in the abdomen. Passage of more gas than usual.  Walking can help get rid of the air that was put into your GI tract during the procedure and reduce the bloating. If you had a lower endoscopy (such as a colonoscopy or flexible sigmoidoscopy) you may notice spotting of blood in your stool or on the toilet paper. If you underwent a bowel prep for your procedure, then you may not have a normal bowel movement for a few days.  DIET: Your first meal following the procedure should be a light meal and then it is ok to progress to your normal diet.  A half-sandwich or bowl of soup is an example of a good first meal.  Heavy or fried foods are harder to digest and may make you feel nauseous or bloated.  Likewise meals heavy in dairy and vegetables can cause extra gas to form and this can also increase the bloating.  Drink plenty of fluids but you should avoid alcoholic beverages for 24 hours.  ACTIVITY: Your care partner should take you home directly after the procedure.  You should plan to take it easy, moving slowly for the rest of the day.  You can resume normal activity the day after the procedure however you should NOT DRIVE or use heavy machinery for 24 hours (because of the sedation medicines used during the test).    SYMPTOMS TO REPORT  IMMEDIATELY: A gastroenterologist can be reached at any hour.  During normal business hours, 8:30 AM to 5:00 PM Monday through Friday, call [phone].  After hours and on weekends, please call the GI answering service at [phone] who will take a message and have the physician on call contact you.   Following lower endoscopy (colonoscopy or flexible sigmoidoscopy):  Excessive amounts of blood in the stool  Significant tenderness or worsening of abdominal pains  Swelling of the abdomen that is new, acute  Fever of 100F or higher  FOLLOW UP: If any biopsies were taken you will be contacted by phone or by letter within the next 1-3 weeks.  Call your gastroenterologist if you have not heard about the biopsies in 3 weeks.  Our staff will call the home number listed on your records the next business day following your procedure to check on you and address any questions or concerns that you may have at that time regarding the information given to you following your procedure. This is a courtesy call and so if there is no answer at the home number and we have not heard from you through the emergency physician on call, we will assume that you have returned to your regular daily activities without incident.  SIGNATURES/CONFIDENTIALITY: You and/or your care partner have signed paperwork which will be entered into your electronic medical record.  These signatures attest to the fact that that the information above on your After Visit Summary   has been reviewed and is understood.  Full responsibility of the confidentiality of this discharge information lies with you and/or your care-partner. 

## 2014-04-01 NOTE — Progress Notes (Signed)
Called to room to assist during endoscopic procedure.  Patient ID and intended procedure confirmed with present staff. Received instructions for my participation in the procedure from the performing physician.  

## 2014-04-02 ENCOUNTER — Telehealth: Payer: Self-pay

## 2014-04-02 NOTE — Telephone Encounter (Signed)
  Follow up Call-  Call back number 04/01/2014  Post procedure Call Back phone  # [phone]  Permission to leave phone message Yes     Patient questions:  Do you have a fever, pain , or abdominal swelling? No. Pain Score  0 *  Have you tolerated food without any problems? Yes.    Have you been able to return to your normal activities? Yes.    Do you have any questions about your discharge instructions: Diet   No. Medications  No. Follow up visit  No.  Do you have questions or concerns about your Care? No.  Actions: * If pain score is 4 or above: No action needed, pain <4.

## 2014-04-09 ENCOUNTER — Encounter: Payer: Self-pay | Admitting: Internal Medicine

## 2014-04-18 ENCOUNTER — Telehealth: Payer: Self-pay | Admitting: Internal Medicine

## 2014-04-18 NOTE — Telephone Encounter (Signed)
Pt had questions regarding path letter. Discussed results with pt and her questions were answered.

## 2014-10-29 DIAGNOSIS — D3131 Benign neoplasm of right choroid: Secondary | ICD-10-CM | POA: Diagnosis not present

## 2014-10-29 DIAGNOSIS — H43813 Vitreous degeneration, bilateral: Secondary | ICD-10-CM | POA: Diagnosis not present

## 2014-10-29 DIAGNOSIS — H35412 Lattice degeneration of retina, left eye: Secondary | ICD-10-CM | POA: Diagnosis not present

## 2014-10-30 DIAGNOSIS — Z1231 Encounter for screening mammogram for malignant neoplasm of breast: Secondary | ICD-10-CM | POA: Diagnosis not present

## 2014-12-09 ENCOUNTER — Encounter: Payer: Self-pay | Admitting: Gastroenterology

## 2015-03-26 DIAGNOSIS — H31002 Unspecified chorioretinal scars, left eye: Secondary | ICD-10-CM | POA: Diagnosis not present

## 2015-03-26 DIAGNOSIS — H35372 Puckering of macula, left eye: Secondary | ICD-10-CM | POA: Diagnosis not present

## 2015-03-26 DIAGNOSIS — Z961 Presence of intraocular lens: Secondary | ICD-10-CM | POA: Diagnosis not present

## 2015-03-26 DIAGNOSIS — H35371 Puckering of macula, right eye: Secondary | ICD-10-CM | POA: Diagnosis not present

## 2015-03-26 DIAGNOSIS — D3131 Benign neoplasm of right choroid: Secondary | ICD-10-CM | POA: Diagnosis not present

## 2015-04-07 DIAGNOSIS — M25552 Pain in left hip: Secondary | ICD-10-CM | POA: Diagnosis not present

## 2015-04-10 DIAGNOSIS — N183 Chronic kidney disease, stage 3 (moderate): Secondary | ICD-10-CM | POA: Diagnosis not present

## 2015-04-10 DIAGNOSIS — E785 Hyperlipidemia, unspecified: Secondary | ICD-10-CM | POA: Diagnosis not present

## 2015-04-10 DIAGNOSIS — R8290 Unspecified abnormal findings in urine: Secondary | ICD-10-CM | POA: Diagnosis not present

## 2015-04-10 DIAGNOSIS — Z85528 Personal history of other malignant neoplasm of kidney: Secondary | ICD-10-CM | POA: Diagnosis not present

## 2015-04-10 DIAGNOSIS — Z0000 Encounter for general adult medical examination without abnormal findings: Secondary | ICD-10-CM | POA: Diagnosis not present

## 2015-04-10 DIAGNOSIS — Z86010 Personal history of colonic polyps: Secondary | ICD-10-CM | POA: Diagnosis not present

## 2015-04-10 DIAGNOSIS — N189 Chronic kidney disease, unspecified: Secondary | ICD-10-CM | POA: Diagnosis not present

## 2015-04-10 DIAGNOSIS — R7301 Impaired fasting glucose: Secondary | ICD-10-CM | POA: Diagnosis not present

## 2015-04-10 DIAGNOSIS — Z79899 Other long term (current) drug therapy: Secondary | ICD-10-CM | POA: Diagnosis not present

## 2015-04-14 DIAGNOSIS — M7632 Iliotibial band syndrome, left leg: Secondary | ICD-10-CM | POA: Diagnosis not present

## 2015-04-14 DIAGNOSIS — M7062 Trochanteric bursitis, left hip: Secondary | ICD-10-CM | POA: Diagnosis not present

## 2015-04-14 DIAGNOSIS — M25552 Pain in left hip: Secondary | ICD-10-CM | POA: Diagnosis not present

## 2015-04-16 DIAGNOSIS — M25552 Pain in left hip: Secondary | ICD-10-CM | POA: Diagnosis not present

## 2015-04-18 DIAGNOSIS — M7632 Iliotibial band syndrome, left leg: Secondary | ICD-10-CM | POA: Diagnosis not present

## 2015-04-18 DIAGNOSIS — M7062 Trochanteric bursitis, left hip: Secondary | ICD-10-CM | POA: Diagnosis not present

## 2015-04-18 DIAGNOSIS — M25552 Pain in left hip: Secondary | ICD-10-CM | POA: Diagnosis not present

## 2015-05-16 DIAGNOSIS — M7062 Trochanteric bursitis, left hip: Secondary | ICD-10-CM | POA: Diagnosis not present

## 2015-06-23 DIAGNOSIS — M7062 Trochanteric bursitis, left hip: Secondary | ICD-10-CM | POA: Diagnosis not present

## 2015-06-23 DIAGNOSIS — M25552 Pain in left hip: Secondary | ICD-10-CM | POA: Diagnosis not present

## 2015-07-07 DIAGNOSIS — M25552 Pain in left hip: Secondary | ICD-10-CM | POA: Diagnosis not present

## 2015-07-10 DIAGNOSIS — M25552 Pain in left hip: Secondary | ICD-10-CM | POA: Diagnosis not present

## 2015-07-14 DIAGNOSIS — M25552 Pain in left hip: Secondary | ICD-10-CM | POA: Diagnosis not present

## 2015-07-17 DIAGNOSIS — M25552 Pain in left hip: Secondary | ICD-10-CM | POA: Diagnosis not present

## 2015-07-21 DIAGNOSIS — M25552 Pain in left hip: Secondary | ICD-10-CM | POA: Diagnosis not present

## 2015-07-24 DIAGNOSIS — M25552 Pain in left hip: Secondary | ICD-10-CM | POA: Diagnosis not present

## 2015-07-31 DIAGNOSIS — M25552 Pain in left hip: Secondary | ICD-10-CM | POA: Diagnosis not present

## 2015-08-12 DIAGNOSIS — N941 Unspecified dyspareunia: Secondary | ICD-10-CM | POA: Diagnosis not present

## 2015-08-12 DIAGNOSIS — Z01419 Encounter for gynecological examination (general) (routine) without abnormal findings: Secondary | ICD-10-CM | POA: Diagnosis not present

## 2015-08-12 DIAGNOSIS — N951 Menopausal and female climacteric states: Secondary | ICD-10-CM | POA: Diagnosis not present

## 2015-10-06 ENCOUNTER — Other Ambulatory Visit: Payer: Self-pay

## 2015-10-06 DIAGNOSIS — Z1231 Encounter for screening mammogram for malignant neoplasm of breast: Secondary | ICD-10-CM

## 2015-10-28 DIAGNOSIS — H35412 Lattice degeneration of retina, left eye: Secondary | ICD-10-CM | POA: Diagnosis not present

## 2015-10-28 DIAGNOSIS — H31009 Unspecified chorioretinal scars, unspecified eye: Secondary | ICD-10-CM | POA: Diagnosis not present

## 2015-10-28 DIAGNOSIS — H35373 Puckering of macula, bilateral: Secondary | ICD-10-CM | POA: Diagnosis not present

## 2015-10-28 DIAGNOSIS — H43813 Vitreous degeneration, bilateral: Secondary | ICD-10-CM | POA: Diagnosis not present

## 2015-10-28 DIAGNOSIS — D3131 Benign neoplasm of right choroid: Secondary | ICD-10-CM | POA: Diagnosis not present

## 2015-11-03 DIAGNOSIS — H26492 Other secondary cataract, left eye: Secondary | ICD-10-CM | POA: Diagnosis not present

## 2015-11-07 ENCOUNTER — Ambulatory Visit
Admission: RE | Admit: 2015-11-07 | Discharge: 2015-11-07 | Disposition: A | Discharge status: Home / Self Care | Payer: PPO | Source: Ambulatory Visit

## 2015-11-07 DIAGNOSIS — Z1231 Encounter for screening mammogram for malignant neoplasm of breast: Secondary | ICD-10-CM | POA: Diagnosis not present

## 2015-11-19 ENCOUNTER — Other Ambulatory Visit: Payer: Self-pay | Admitting: Family Medicine

## 2015-11-19 DIAGNOSIS — Z961 Presence of intraocular lens: Secondary | ICD-10-CM | POA: Diagnosis not present

## 2015-11-19 DIAGNOSIS — H43812 Vitreous degeneration, left eye: Secondary | ICD-10-CM | POA: Diagnosis not present

## 2015-11-19 DIAGNOSIS — H31002 Unspecified chorioretinal scars, left eye: Secondary | ICD-10-CM | POA: Diagnosis not present

## 2015-11-19 DIAGNOSIS — R928 Other abnormal and inconclusive findings on diagnostic imaging of breast: Secondary | ICD-10-CM

## 2015-11-19 DIAGNOSIS — H26491 Other secondary cataract, right eye: Secondary | ICD-10-CM | POA: Diagnosis not present

## 2015-11-24 ENCOUNTER — Ambulatory Visit
Admission: RE | Admit: 2015-11-24 | Discharge: 2015-11-24 | Disposition: A | Discharge status: Home / Self Care | Payer: PPO | Source: Ambulatory Visit | Attending: Family Medicine | Admitting: Family Medicine

## 2015-11-24 DIAGNOSIS — N63 Unspecified lump in breast: Secondary | ICD-10-CM | POA: Diagnosis not present

## 2015-11-24 DIAGNOSIS — R928 Other abnormal and inconclusive findings on diagnostic imaging of breast: Secondary | ICD-10-CM

## 2015-12-15 DIAGNOSIS — M7541 Impingement syndrome of right shoulder: Secondary | ICD-10-CM | POA: Diagnosis not present

## 2015-12-15 DIAGNOSIS — M25511 Pain in right shoulder: Secondary | ICD-10-CM | POA: Diagnosis not present

## 2016-01-01 DIAGNOSIS — M7541 Impingement syndrome of right shoulder: Secondary | ICD-10-CM | POA: Diagnosis not present

## 2016-01-07 DIAGNOSIS — M7541 Impingement syndrome of right shoulder: Secondary | ICD-10-CM | POA: Diagnosis not present

## 2016-01-09 DIAGNOSIS — M7541 Impingement syndrome of right shoulder: Secondary | ICD-10-CM | POA: Diagnosis not present

## 2016-01-21 DIAGNOSIS — M7541 Impingement syndrome of right shoulder: Secondary | ICD-10-CM | POA: Diagnosis not present

## 2016-01-25 DIAGNOSIS — R109 Unspecified abdominal pain: Secondary | ICD-10-CM | POA: Diagnosis not present

## 2016-01-25 DIAGNOSIS — K5792 Diverticulitis of intestine, part unspecified, without perforation or abscess without bleeding: Secondary | ICD-10-CM | POA: Diagnosis not present

## 2016-01-26 DIAGNOSIS — M7541 Impingement syndrome of right shoulder: Secondary | ICD-10-CM | POA: Diagnosis not present

## 2016-02-11 DIAGNOSIS — L821 Other seborrheic keratosis: Secondary | ICD-10-CM | POA: Diagnosis not present

## 2016-02-13 DIAGNOSIS — M7062 Trochanteric bursitis, left hip: Secondary | ICD-10-CM | POA: Diagnosis not present

## 2016-02-13 DIAGNOSIS — M25551 Pain in right hip: Secondary | ICD-10-CM | POA: Diagnosis not present

## 2016-02-25 DIAGNOSIS — M25511 Pain in right shoulder: Secondary | ICD-10-CM | POA: Diagnosis not present

## 2016-03-03 DIAGNOSIS — M7541 Impingement syndrome of right shoulder: Secondary | ICD-10-CM | POA: Diagnosis not present

## 2016-03-17 DIAGNOSIS — M7541 Impingement syndrome of right shoulder: Secondary | ICD-10-CM | POA: Diagnosis not present

## 2016-03-17 DIAGNOSIS — M75111 Incomplete rotator cuff tear or rupture of right shoulder, not specified as traumatic: Secondary | ICD-10-CM | POA: Diagnosis not present

## 2016-04-19 ENCOUNTER — Other Ambulatory Visit: Payer: Self-pay | Admitting: Family Medicine

## 2016-04-19 DIAGNOSIS — N63 Unspecified lump in unspecified breast: Secondary | ICD-10-CM

## 2016-04-29 DIAGNOSIS — D3131 Benign neoplasm of right choroid: Secondary | ICD-10-CM | POA: Diagnosis not present

## 2016-04-29 DIAGNOSIS — H35412 Lattice degeneration of retina, left eye: Secondary | ICD-10-CM | POA: Diagnosis not present

## 2016-04-29 DIAGNOSIS — H31009 Unspecified chorioretinal scars, unspecified eye: Secondary | ICD-10-CM | POA: Diagnosis not present

## 2016-04-29 DIAGNOSIS — H35373 Puckering of macula, bilateral: Secondary | ICD-10-CM | POA: Diagnosis not present

## 2016-04-29 DIAGNOSIS — H43813 Vitreous degeneration, bilateral: Secondary | ICD-10-CM | POA: Diagnosis not present

## 2016-05-13 DIAGNOSIS — R928 Other abnormal and inconclusive findings on diagnostic imaging of breast: Secondary | ICD-10-CM | POA: Diagnosis not present

## 2016-05-13 DIAGNOSIS — Z0000 Encounter for general adult medical examination without abnormal findings: Secondary | ICD-10-CM | POA: Diagnosis not present

## 2016-05-13 DIAGNOSIS — E785 Hyperlipidemia, unspecified: Secondary | ICD-10-CM | POA: Diagnosis not present

## 2016-05-13 DIAGNOSIS — N183 Chronic kidney disease, stage 3 (moderate): Secondary | ICD-10-CM | POA: Diagnosis not present

## 2016-05-13 DIAGNOSIS — Z86010 Personal history of colonic polyps: Secondary | ICD-10-CM | POA: Diagnosis not present

## 2016-05-13 DIAGNOSIS — R7301 Impaired fasting glucose: Secondary | ICD-10-CM | POA: Diagnosis not present

## 2016-05-13 DIAGNOSIS — R8290 Unspecified abnormal findings in urine: Secondary | ICD-10-CM | POA: Diagnosis not present

## 2016-05-13 DIAGNOSIS — N189 Chronic kidney disease, unspecified: Secondary | ICD-10-CM | POA: Diagnosis not present

## 2016-05-13 DIAGNOSIS — Z85528 Personal history of other malignant neoplasm of kidney: Secondary | ICD-10-CM | POA: Diagnosis not present

## 2016-05-21 ENCOUNTER — Ambulatory Visit (INDEPENDENT_AMBULATORY_CARE_PROVIDER_SITE_OTHER): Payer: PPO | Admitting: Podiatry

## 2016-05-21 ENCOUNTER — Encounter: Payer: Self-pay | Admitting: Podiatry

## 2016-05-21 VITALS — BP 123/68 | HR 65 | Temp 97.3°F

## 2016-05-21 DIAGNOSIS — L600 Ingrowing nail: Secondary | ICD-10-CM

## 2016-05-21 DIAGNOSIS — M2042 Other hammer toe(s) (acquired), left foot: Secondary | ICD-10-CM | POA: Diagnosis not present

## 2016-05-21 DIAGNOSIS — M2041 Other hammer toe(s) (acquired), right foot: Secondary | ICD-10-CM | POA: Diagnosis not present

## 2016-05-21 DIAGNOSIS — L84 Corns and callosities: Secondary | ICD-10-CM | POA: Diagnosis not present

## 2016-05-24 NOTE — Progress Notes (Signed)
Subjective:     Patient ID: [name], female   DOB: 07/30/1940, 75 y.o.   MRN: [id number]  HPI patient presents stating that she's had a callus on the bottom of her left foot and that also she's had ingrown toenails of her big toes medial border of both feet that a been tender and making it hard for her to wear shoe gear comfortably or sheets at night make them irritated   Review of Systems     Objective:   Physical Exam Neurovascular status intact muscle strength adequate range of motion within normal limits with patient noted to have incurvated medial borders the hallux bilateral with pain when palpated with distal redness and no active drainage noted. 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 I did explain the procedure and explained risk of healing and all other complications and patient wants procedure and today I infiltrated each hallux 60 mg Xylocaine Marcaine mixture remove medial border exposed matrix and apply chemical phenol 3 applications 30 seconds followed by alcohol lavage and sterile dressing. Gave instructions on soaks and reappoint

## 2016-05-25 ENCOUNTER — Other Ambulatory Visit: Payer: PPO

## 2016-05-26 ENCOUNTER — Encounter: Payer: Self-pay | Admitting: Podiatry

## 2016-05-26 ENCOUNTER — Ambulatory Visit
Admission: RE | Admit: 2016-05-26 | Discharge: 2016-05-26 | Disposition: A | Discharge status: Home / Self Care | Payer: PPO | Source: Ambulatory Visit | Attending: Family Medicine | Admitting: Family Medicine

## 2016-05-26 ENCOUNTER — Ambulatory Visit (INDEPENDENT_AMBULATORY_CARE_PROVIDER_SITE_OTHER): Payer: PPO | Admitting: Podiatry

## 2016-05-26 DIAGNOSIS — L03032 Cellulitis of left toe: Secondary | ICD-10-CM

## 2016-05-26 DIAGNOSIS — N63 Unspecified lump in unspecified breast: Secondary | ICD-10-CM

## 2016-05-26 DIAGNOSIS — R928 Other abnormal and inconclusive findings on diagnostic imaging of breast: Secondary | ICD-10-CM | POA: Diagnosis not present

## 2016-05-26 DIAGNOSIS — N6011 Diffuse cystic mastopathy of right breast: Secondary | ICD-10-CM | POA: Diagnosis not present

## 2016-05-26 MED ORDER — CEPHALEXIN 500 MG PO CAPS: 500.0000 mg | ORAL_CAPSULE | Freq: Two times a day (BID) | ORAL | 1 refills | Status: DC

## 2016-05-26 NOTE — Progress Notes (Signed)
Subjective:     Patient ID: [name], female   DOB: [birth year], 75 y.o.   MRN: [id number]  HPI patient presents stating she's been getting some blistering around her big toe left over right with some redness on the left toe that she wanted to have checked. Not having significant discomfort but wanted to make sure nothing was okay   Review of Systems     Objective:   Physical Exam Neurovascular status intact muscle strength was adequate with patient having localized redness of the left hallux with slight blistering noted not where we did the procedure but more on the lateral plantar side of the hallux. It is localized with no proximal edema erythema or drainage noted currently    Assessment:     Some form of reaction possibly to the initial dressing or soaks with possibility for mild localized paronychia infection left    Plan:     Instructed on change in soak regimen 2 Epsom salts and continue compression dressing to be used during the day and as precautionary measure placed on cephalexin 500 mg twice a day and gave strict instructions of any proximal redness drainage or resolution does not occur to let us know immediately and if any systemic signs of infection were to occur to go straight to the emergency room

## 2016-07-07 DIAGNOSIS — D225 Melanocytic nevi of trunk: Secondary | ICD-10-CM | POA: Diagnosis not present

## 2016-07-07 DIAGNOSIS — L814 Other melanin hyperpigmentation: Secondary | ICD-10-CM | POA: Diagnosis not present

## 2016-07-07 DIAGNOSIS — D1801 Hemangioma of skin and subcutaneous tissue: Secondary | ICD-10-CM | POA: Diagnosis not present

## 2016-07-07 DIAGNOSIS — L821 Other seborrheic keratosis: Secondary | ICD-10-CM | POA: Diagnosis not present

## 2016-07-07 DIAGNOSIS — L309 Dermatitis, unspecified: Secondary | ICD-10-CM | POA: Diagnosis not present

## 2016-08-05 DIAGNOSIS — M75111 Incomplete rotator cuff tear or rupture of right shoulder, not specified as traumatic: Secondary | ICD-10-CM | POA: Diagnosis not present

## 2016-09-30 DIAGNOSIS — D3131 Benign neoplasm of right choroid: Secondary | ICD-10-CM | POA: Diagnosis not present

## 2016-09-30 DIAGNOSIS — H31009 Unspecified chorioretinal scars, unspecified eye: Secondary | ICD-10-CM | POA: Diagnosis not present

## 2016-09-30 DIAGNOSIS — H35412 Lattice degeneration of retina, left eye: Secondary | ICD-10-CM | POA: Diagnosis not present

## 2016-09-30 DIAGNOSIS — H43813 Vitreous degeneration, bilateral: Secondary | ICD-10-CM | POA: Diagnosis not present

## 2016-10-05 DIAGNOSIS — M5430 Sciatica, unspecified side: Secondary | ICD-10-CM | POA: Diagnosis not present

## 2016-10-05 DIAGNOSIS — N959 Unspecified menopausal and perimenopausal disorder: Secondary | ICD-10-CM | POA: Diagnosis not present

## 2016-10-05 DIAGNOSIS — Z01419 Encounter for gynecological examination (general) (routine) without abnormal findings: Secondary | ICD-10-CM | POA: Diagnosis not present

## 2016-11-07 IMAGING — MG MM SCREENING BREAST TOMO BILATERAL
9 of 14 series · 9 of 30 positions shown · non-contrast
Comparison: Previous exam(s).

CLINICAL DATA: Screening.

EXAM:
2D DIGITAL SCREENING BILATERAL MAMMOGRAM WITH CAD AND ADJUNCT TOMO

[R MLO (1 of 2)]
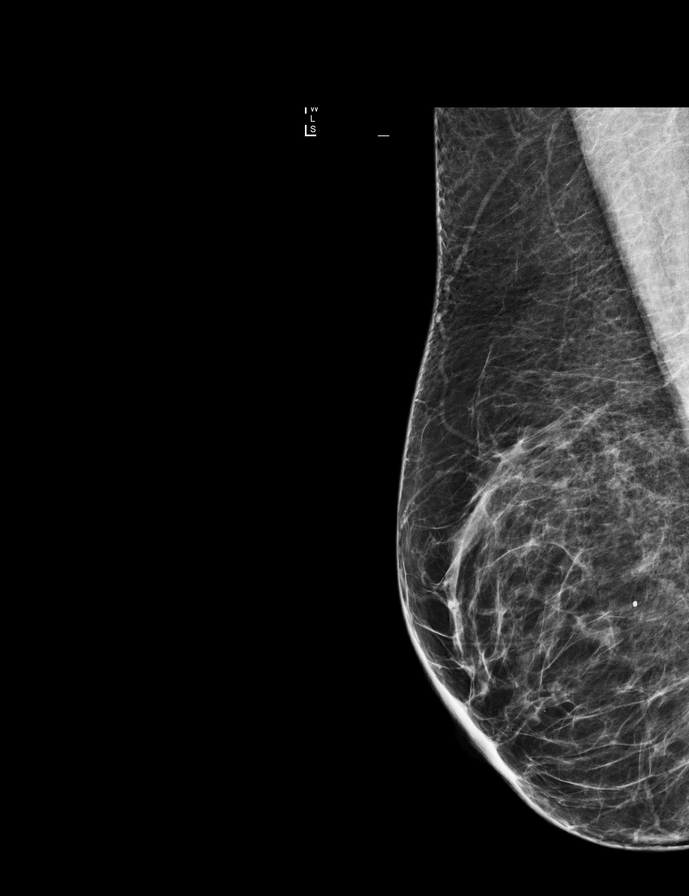

[R CC (1 of 2)]
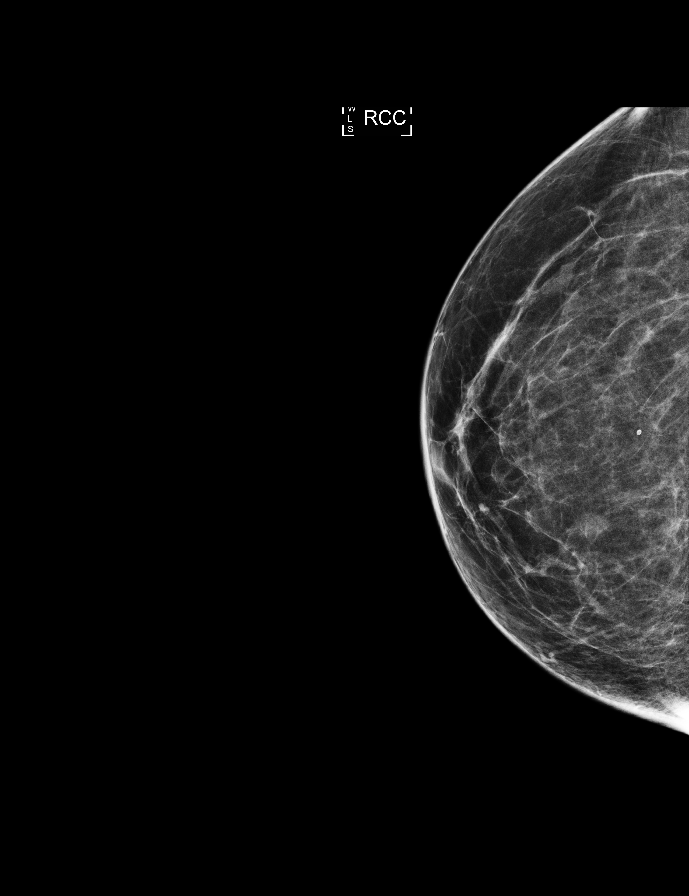

[L CC synth-2D]
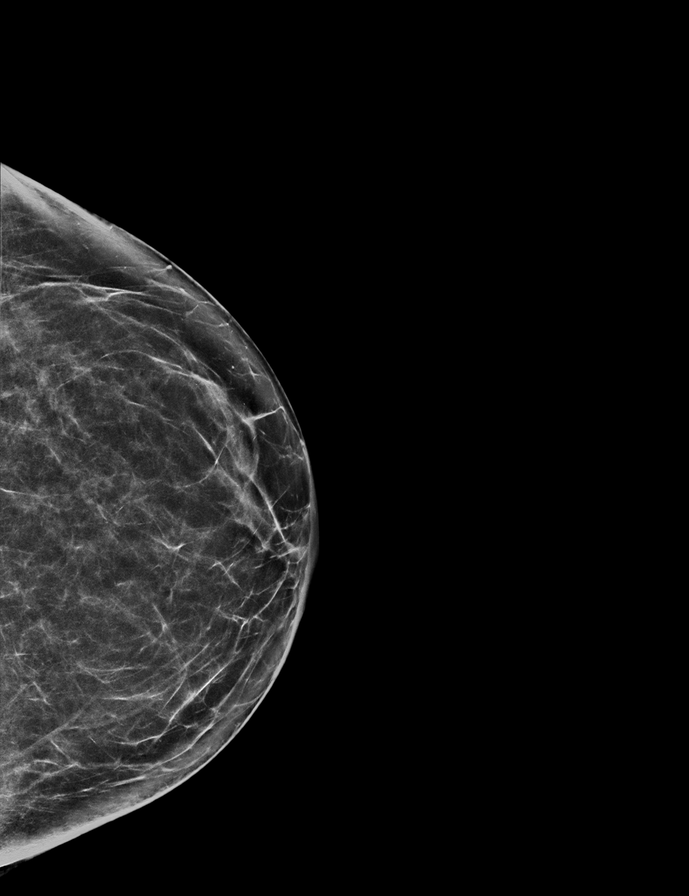

[R CC (2 of 2)]
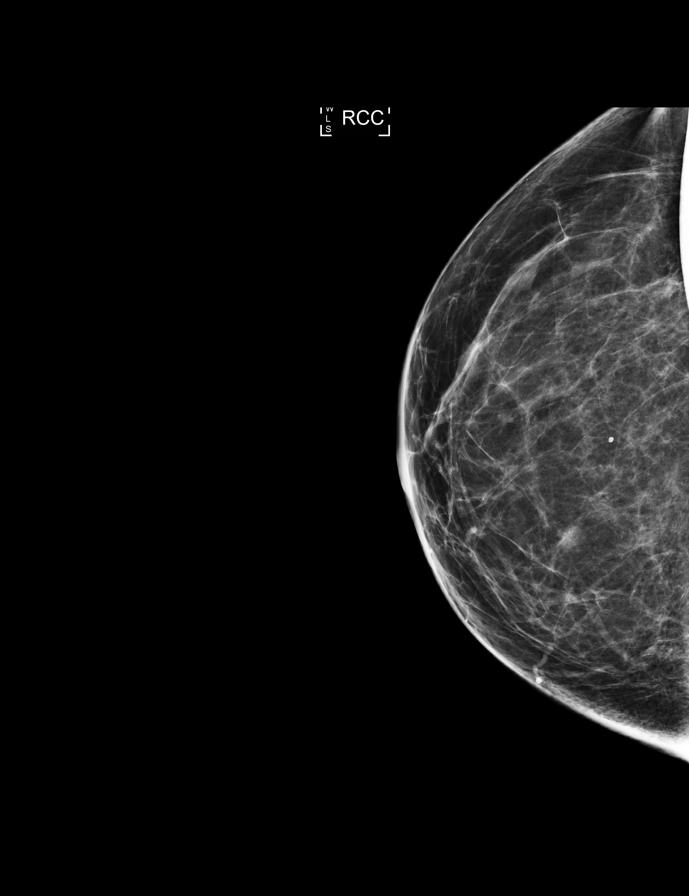

[R MLO (2 of 2)]
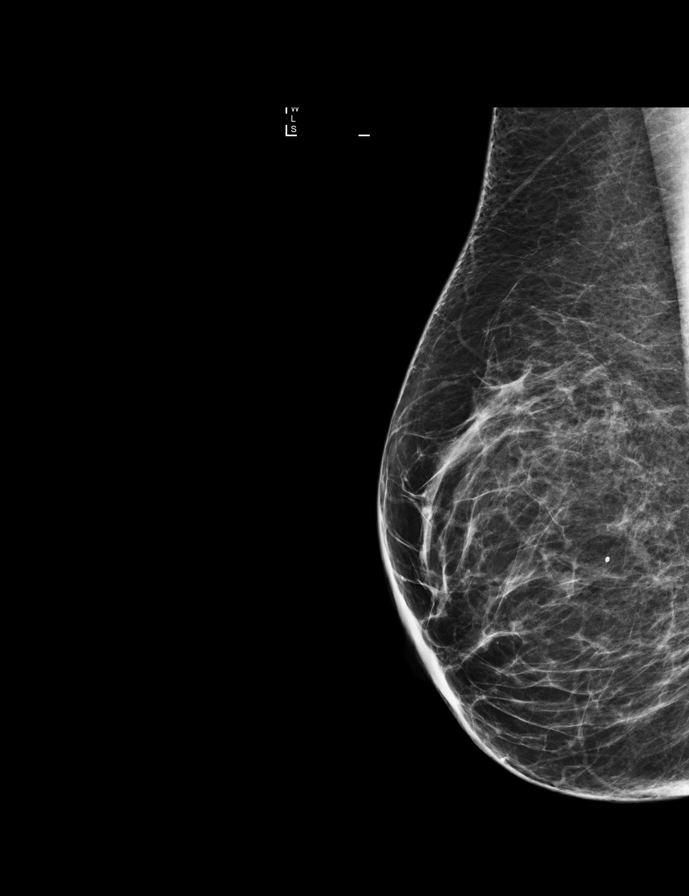

[R MLO synth-2D]
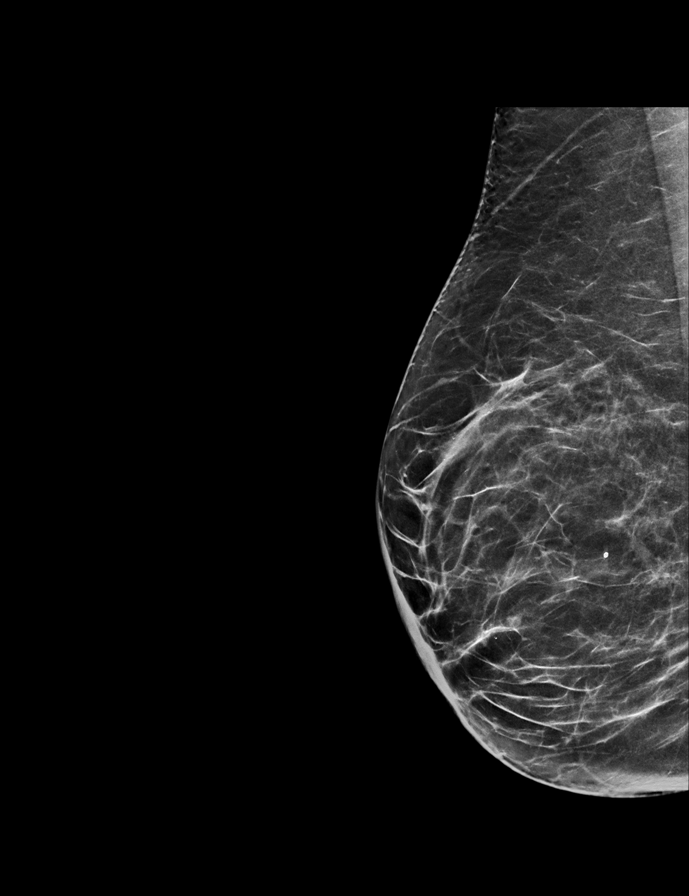

[L MLO synth-2D]
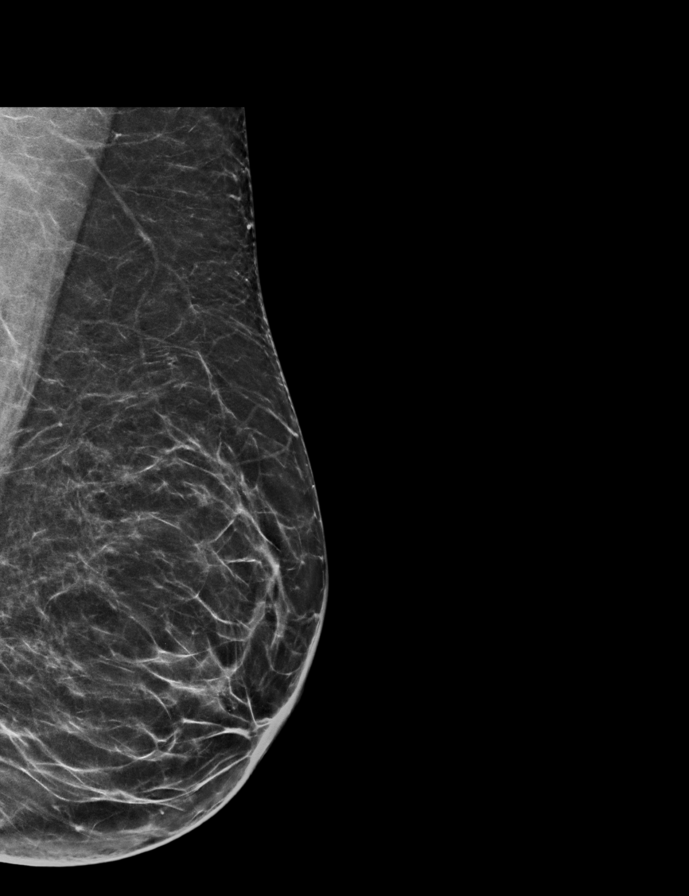

[L CC]
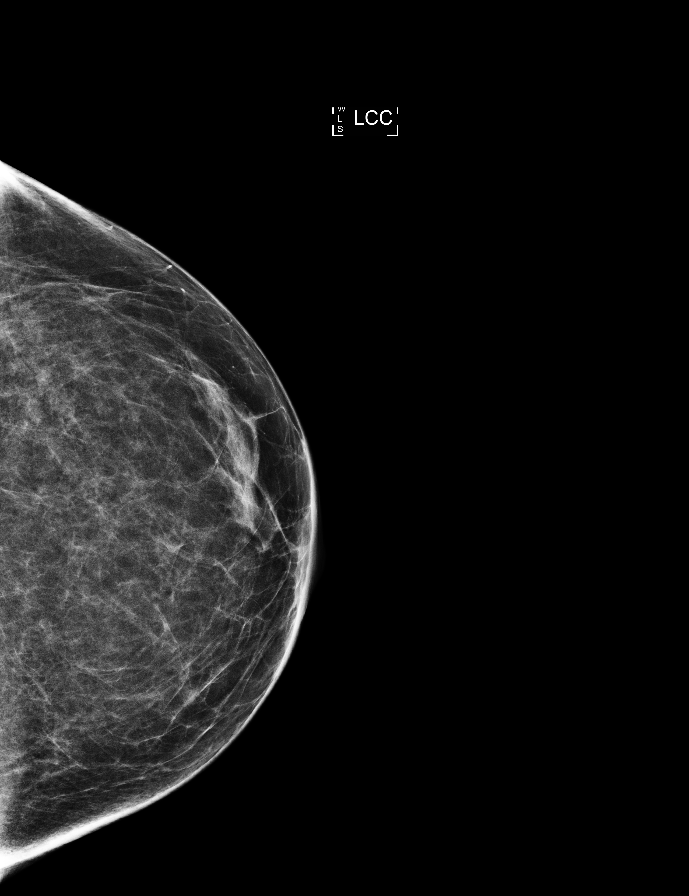

[L MLO]
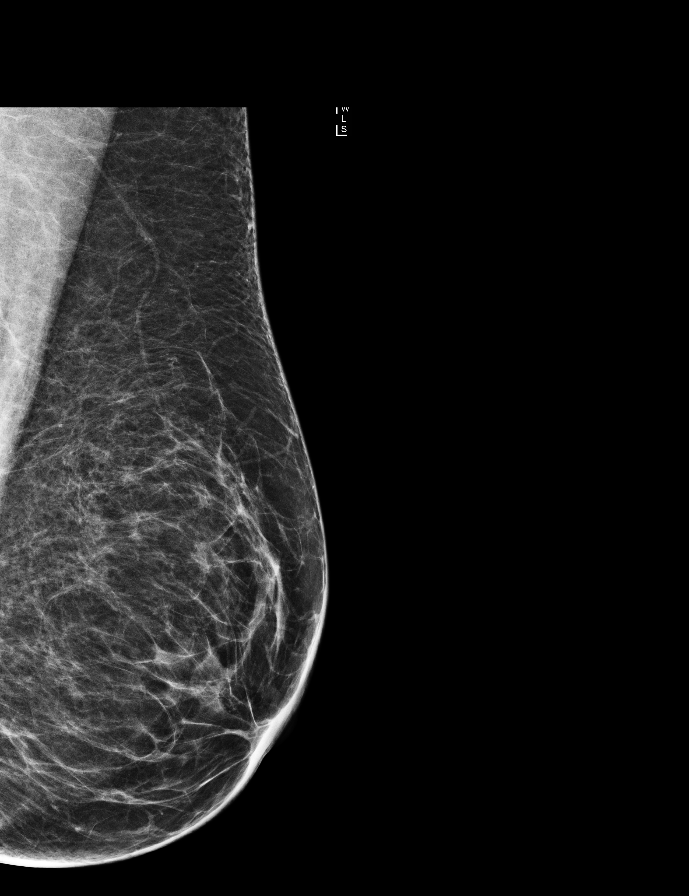

[9 of 30 positions shown; findings below may reference images not displayed]

ACR Breast Density Category b: There are scattered areas of
fibroglandular density.
FINDINGS: In the right breast, a possible mass warrants further evaluation. In
the left breast, no findings suspicious for malignancy. Images were
processed with CAD.
IMPRESSION: Further evaluation is suggested for possible mass in the right
breast.

RECOMMENDATION:
Ultrasound of the right breast. (Code:[id number])

The patient will be contacted regarding the findings, and additional
imaging will be scheduled.

BI-RADS CATEGORY  0: Incomplete. Need additional imaging evaluation
and/or prior mammograms for comparison.

## 2016-12-05 DIAGNOSIS — K5792 Diverticulitis of intestine, part unspecified, without perforation or abscess without bleeding: Secondary | ICD-10-CM | POA: Diagnosis not present

## 2016-12-17 ENCOUNTER — Other Ambulatory Visit: Payer: Self-pay | Admitting: Family Medicine

## 2016-12-17 DIAGNOSIS — N6489 Other specified disorders of breast: Secondary | ICD-10-CM

## 2016-12-27 DIAGNOSIS — Z961 Presence of intraocular lens: Secondary | ICD-10-CM | POA: Diagnosis not present

## 2016-12-27 DIAGNOSIS — H35373 Puckering of macula, bilateral: Secondary | ICD-10-CM | POA: Diagnosis not present

## 2016-12-27 DIAGNOSIS — D3131 Benign neoplasm of right choroid: Secondary | ICD-10-CM | POA: Diagnosis not present

## 2016-12-29 DIAGNOSIS — M21612 Bunion of left foot: Secondary | ICD-10-CM | POA: Diagnosis not present

## 2016-12-29 DIAGNOSIS — M2042 Other hammer toe(s) (acquired), left foot: Secondary | ICD-10-CM | POA: Diagnosis not present

## 2017-01-03 ENCOUNTER — Ambulatory Visit: Payer: PPO

## 2017-01-03 ENCOUNTER — Ambulatory Visit
Admission: RE | Admit: 2017-01-03 | Discharge: 2017-01-03 | Disposition: A | Discharge status: Home / Self Care | Payer: PPO | Source: Ambulatory Visit | Attending: Family Medicine | Admitting: Family Medicine

## 2017-01-03 DIAGNOSIS — N6489 Other specified disorders of breast: Secondary | ICD-10-CM

## 2017-01-03 DIAGNOSIS — R928 Other abnormal and inconclusive findings on diagnostic imaging of breast: Secondary | ICD-10-CM | POA: Diagnosis not present

## 2017-01-12 DIAGNOSIS — M2170 Unequal limb length (acquired), unspecified site: Secondary | ICD-10-CM | POA: Diagnosis not present

## 2017-01-12 DIAGNOSIS — M7632 Iliotibial band syndrome, left leg: Secondary | ICD-10-CM | POA: Diagnosis not present

## 2017-01-12 DIAGNOSIS — M7062 Trochanteric bursitis, left hip: Secondary | ICD-10-CM | POA: Diagnosis not present

## 2017-03-17 DIAGNOSIS — R8290 Unspecified abnormal findings in urine: Secondary | ICD-10-CM | POA: Diagnosis not present

## 2017-03-17 DIAGNOSIS — E663 Overweight: Secondary | ICD-10-CM | POA: Diagnosis not present

## 2017-03-17 DIAGNOSIS — Z6827 Body mass index (BMI) 27.0-27.9, adult: Secondary | ICD-10-CM | POA: Diagnosis not present

## 2017-03-17 DIAGNOSIS — M545 Low back pain: Secondary | ICD-10-CM | POA: Diagnosis not present

## 2017-03-18 DIAGNOSIS — G8929 Other chronic pain: Secondary | ICD-10-CM | POA: Diagnosis not present

## 2017-03-18 DIAGNOSIS — M545 Low back pain: Secondary | ICD-10-CM | POA: Diagnosis not present

## 2017-03-18 DIAGNOSIS — M7062 Trochanteric bursitis, left hip: Secondary | ICD-10-CM | POA: Diagnosis not present

## 2017-03-24 DIAGNOSIS — N898 Other specified noninflammatory disorders of vagina: Secondary | ICD-10-CM | POA: Diagnosis not present

## 2017-03-28 DIAGNOSIS — G8929 Other chronic pain: Secondary | ICD-10-CM | POA: Diagnosis not present

## 2017-03-28 DIAGNOSIS — M545 Low back pain: Secondary | ICD-10-CM | POA: Diagnosis not present

## 2017-03-30 DIAGNOSIS — M7062 Trochanteric bursitis, left hip: Secondary | ICD-10-CM | POA: Diagnosis not present

## 2017-03-30 DIAGNOSIS — G8929 Other chronic pain: Secondary | ICD-10-CM | POA: Diagnosis not present

## 2017-03-30 DIAGNOSIS — M545 Low back pain: Secondary | ICD-10-CM | POA: Diagnosis not present

## 2017-03-31 DIAGNOSIS — M545 Low back pain: Secondary | ICD-10-CM | POA: Diagnosis not present

## 2017-03-31 DIAGNOSIS — G8929 Other chronic pain: Secondary | ICD-10-CM | POA: Diagnosis not present

## 2017-04-04 DIAGNOSIS — M545 Low back pain: Secondary | ICD-10-CM | POA: Diagnosis not present

## 2017-04-04 DIAGNOSIS — G8929 Other chronic pain: Secondary | ICD-10-CM | POA: Diagnosis not present

## 2017-04-07 DIAGNOSIS — G8929 Other chronic pain: Secondary | ICD-10-CM | POA: Diagnosis not present

## 2017-04-07 DIAGNOSIS — M549 Dorsalgia, unspecified: Secondary | ICD-10-CM | POA: Diagnosis not present

## 2017-04-07 DIAGNOSIS — M545 Low back pain: Secondary | ICD-10-CM | POA: Diagnosis not present

## 2017-04-21 DIAGNOSIS — H43813 Vitreous degeneration, bilateral: Secondary | ICD-10-CM | POA: Diagnosis not present

## 2017-04-21 DIAGNOSIS — H31009 Unspecified chorioretinal scars, unspecified eye: Secondary | ICD-10-CM | POA: Diagnosis not present

## 2017-04-21 DIAGNOSIS — D3131 Benign neoplasm of right choroid: Secondary | ICD-10-CM | POA: Diagnosis not present

## 2017-04-21 DIAGNOSIS — H35412 Lattice degeneration of retina, left eye: Secondary | ICD-10-CM | POA: Diagnosis not present

## 2017-04-21 DIAGNOSIS — H33322 Round hole, left eye: Secondary | ICD-10-CM | POA: Diagnosis not present

## 2017-04-26 DIAGNOSIS — M545 Low back pain: Secondary | ICD-10-CM | POA: Diagnosis not present

## 2017-04-26 DIAGNOSIS — M5136 Other intervertebral disc degeneration, lumbar region: Secondary | ICD-10-CM | POA: Diagnosis not present

## 2017-04-26 DIAGNOSIS — M5431 Sciatica, right side: Secondary | ICD-10-CM | POA: Diagnosis not present

## 2017-04-26 DIAGNOSIS — M4126 Other idiopathic scoliosis, lumbar region: Secondary | ICD-10-CM | POA: Diagnosis not present

## 2017-05-12 DIAGNOSIS — N760 Acute vaginitis: Secondary | ICD-10-CM | POA: Diagnosis not present

## 2017-05-13 DIAGNOSIS — J019 Acute sinusitis, unspecified: Secondary | ICD-10-CM | POA: Diagnosis not present

## 2017-06-16 DIAGNOSIS — N760 Acute vaginitis: Secondary | ICD-10-CM | POA: Diagnosis not present

## 2017-06-17 DIAGNOSIS — Z79899 Other long term (current) drug therapy: Secondary | ICD-10-CM | POA: Diagnosis not present

## 2017-06-17 DIAGNOSIS — N183 Chronic kidney disease, stage 3 (moderate): Secondary | ICD-10-CM | POA: Diagnosis not present

## 2017-06-17 DIAGNOSIS — E785 Hyperlipidemia, unspecified: Secondary | ICD-10-CM | POA: Diagnosis not present

## 2017-06-22 DIAGNOSIS — R8290 Unspecified abnormal findings in urine: Secondary | ICD-10-CM | POA: Diagnosis not present

## 2017-06-22 DIAGNOSIS — N189 Chronic kidney disease, unspecified: Secondary | ICD-10-CM | POA: Diagnosis not present

## 2017-06-22 DIAGNOSIS — Z79899 Other long term (current) drug therapy: Secondary | ICD-10-CM | POA: Diagnosis not present

## 2017-06-22 DIAGNOSIS — E785 Hyperlipidemia, unspecified: Secondary | ICD-10-CM | POA: Diagnosis not present

## 2017-06-22 DIAGNOSIS — E663 Overweight: Secondary | ICD-10-CM | POA: Diagnosis not present

## 2017-06-22 DIAGNOSIS — Z85528 Personal history of other malignant neoplasm of kidney: Secondary | ICD-10-CM | POA: Diagnosis not present

## 2017-06-22 DIAGNOSIS — L989 Disorder of the skin and subcutaneous tissue, unspecified: Secondary | ICD-10-CM | POA: Diagnosis not present

## 2017-06-22 DIAGNOSIS — Z13820 Encounter for screening for osteoporosis: Secondary | ICD-10-CM | POA: Diagnosis not present

## 2017-06-22 DIAGNOSIS — Z0000 Encounter for general adult medical examination without abnormal findings: Secondary | ICD-10-CM | POA: Diagnosis not present

## 2017-06-22 DIAGNOSIS — Z86010 Personal history of colonic polyps: Secondary | ICD-10-CM | POA: Diagnosis not present

## 2017-06-23 ENCOUNTER — Other Ambulatory Visit: Payer: Self-pay | Admitting: Family Medicine

## 2017-06-23 DIAGNOSIS — R5381 Other malaise: Secondary | ICD-10-CM

## 2017-06-28 ENCOUNTER — Other Ambulatory Visit: Payer: Self-pay | Admitting: Family Medicine

## 2017-06-28 DIAGNOSIS — M8580 Other specified disorders of bone density and structure, unspecified site: Secondary | ICD-10-CM

## 2017-06-30 DIAGNOSIS — J029 Acute pharyngitis, unspecified: Secondary | ICD-10-CM | POA: Diagnosis not present

## 2017-06-30 DIAGNOSIS — R05 Cough: Secondary | ICD-10-CM | POA: Diagnosis not present

## 2017-06-30 DIAGNOSIS — J0100 Acute maxillary sinusitis, unspecified: Secondary | ICD-10-CM | POA: Diagnosis not present

## 2017-07-12 DIAGNOSIS — C44319 Basal cell carcinoma of skin of other parts of face: Secondary | ICD-10-CM | POA: Diagnosis not present

## 2017-08-02 DIAGNOSIS — C44319 Basal cell carcinoma of skin of other parts of face: Secondary | ICD-10-CM | POA: Diagnosis not present

## 2017-08-02 DIAGNOSIS — Z85828 Personal history of other malignant neoplasm of skin: Secondary | ICD-10-CM | POA: Diagnosis not present

## 2017-08-22 DIAGNOSIS — H01026 Squamous blepharitis left eye, unspecified eyelid: Secondary | ICD-10-CM | POA: Diagnosis not present

## 2017-08-22 DIAGNOSIS — H0014 Chalazion left upper eyelid: Secondary | ICD-10-CM | POA: Diagnosis not present

## 2017-10-10 DIAGNOSIS — H00021 Hordeolum internum right upper eyelid: Secondary | ICD-10-CM | POA: Diagnosis not present

## 2017-10-10 DIAGNOSIS — H0014 Chalazion left upper eyelid: Secondary | ICD-10-CM | POA: Diagnosis not present

## 2017-10-19 DIAGNOSIS — M25511 Pain in right shoulder: Secondary | ICD-10-CM | POA: Diagnosis not present

## 2017-11-14 ENCOUNTER — Other Ambulatory Visit: Payer: Self-pay | Admitting: Family Medicine

## 2017-11-14 DIAGNOSIS — Z139 Encounter for screening, unspecified: Secondary | ICD-10-CM

## 2017-12-27 DIAGNOSIS — Z961 Presence of intraocular lens: Secondary | ICD-10-CM | POA: Diagnosis not present

## 2017-12-27 DIAGNOSIS — H35372 Puckering of macula, left eye: Secondary | ICD-10-CM | POA: Diagnosis not present

## 2017-12-27 DIAGNOSIS — H35033 Hypertensive retinopathy, bilateral: Secondary | ICD-10-CM | POA: Diagnosis not present

## 2017-12-27 DIAGNOSIS — D3131 Benign neoplasm of right choroid: Secondary | ICD-10-CM | POA: Diagnosis not present

## 2018-01-03 DIAGNOSIS — M4186 Other forms of scoliosis, lumbar region: Secondary | ICD-10-CM | POA: Diagnosis not present

## 2018-01-03 DIAGNOSIS — M545 Low back pain: Secondary | ICD-10-CM | POA: Diagnosis not present

## 2018-01-03 DIAGNOSIS — M5136 Other intervertebral disc degeneration, lumbar region: Secondary | ICD-10-CM | POA: Diagnosis not present

## 2018-01-03 DIAGNOSIS — M5417 Radiculopathy, lumbosacral region: Secondary | ICD-10-CM | POA: Diagnosis not present

## 2018-01-04 ENCOUNTER — Ambulatory Visit
Admission: RE | Admit: 2018-01-04 | Discharge: 2018-01-04 | Disposition: A | Discharge status: Home / Self Care | Payer: PPO | Source: Ambulatory Visit | Attending: Family Medicine | Admitting: Family Medicine

## 2018-01-04 DIAGNOSIS — Z139 Encounter for screening, unspecified: Secondary | ICD-10-CM

## 2018-01-04 DIAGNOSIS — Z1231 Encounter for screening mammogram for malignant neoplasm of breast: Secondary | ICD-10-CM | POA: Diagnosis not present

## 2018-01-04 DIAGNOSIS — M8580 Other specified disorders of bone density and structure, unspecified site: Secondary | ICD-10-CM

## 2018-01-04 DIAGNOSIS — Z780 Asymptomatic menopausal state: Secondary | ICD-10-CM | POA: Diagnosis not present

## 2018-01-05 ENCOUNTER — Other Ambulatory Visit: Payer: Self-pay | Admitting: Family Medicine

## 2018-01-05 DIAGNOSIS — R928 Other abnormal and inconclusive findings on diagnostic imaging of breast: Secondary | ICD-10-CM

## 2018-01-11 ENCOUNTER — Ambulatory Visit
Admission: RE | Admit: 2018-01-11 | Discharge: 2018-01-11 | Disposition: A | Discharge status: Home / Self Care | Payer: PPO | Source: Ambulatory Visit | Attending: Family Medicine | Admitting: Family Medicine

## 2018-01-11 DIAGNOSIS — R928 Other abnormal and inconclusive findings on diagnostic imaging of breast: Secondary | ICD-10-CM

## 2018-01-17 DIAGNOSIS — M25511 Pain in right shoulder: Secondary | ICD-10-CM | POA: Diagnosis not present

## 2018-01-17 DIAGNOSIS — M25512 Pain in left shoulder: Secondary | ICD-10-CM | POA: Diagnosis not present

## 2018-01-20 DIAGNOSIS — M25511 Pain in right shoulder: Secondary | ICD-10-CM | POA: Diagnosis not present

## 2018-02-21 DIAGNOSIS — M25511 Pain in right shoulder: Secondary | ICD-10-CM | POA: Diagnosis not present

## 2018-03-01 DIAGNOSIS — D220 Melanocytic nevi of lip: Secondary | ICD-10-CM | POA: Diagnosis not present

## 2018-03-01 DIAGNOSIS — Z85828 Personal history of other malignant neoplasm of skin: Secondary | ICD-10-CM | POA: Diagnosis not present

## 2018-03-01 DIAGNOSIS — D2262 Melanocytic nevi of left upper limb, including shoulder: Secondary | ICD-10-CM | POA: Diagnosis not present

## 2018-03-01 DIAGNOSIS — D224 Melanocytic nevi of scalp and neck: Secondary | ICD-10-CM | POA: Diagnosis not present

## 2018-03-01 DIAGNOSIS — C44719 Basal cell carcinoma of skin of left lower limb, including hip: Secondary | ICD-10-CM | POA: Diagnosis not present

## 2018-03-01 DIAGNOSIS — D2272 Melanocytic nevi of left lower limb, including hip: Secondary | ICD-10-CM | POA: Diagnosis not present

## 2018-03-01 DIAGNOSIS — D2261 Melanocytic nevi of right upper limb, including shoulder: Secondary | ICD-10-CM | POA: Diagnosis not present

## 2018-03-01 DIAGNOSIS — D2271 Melanocytic nevi of right lower limb, including hip: Secondary | ICD-10-CM | POA: Diagnosis not present

## 2018-03-01 DIAGNOSIS — L821 Other seborrheic keratosis: Secondary | ICD-10-CM | POA: Diagnosis not present

## 2018-04-20 DIAGNOSIS — M25512 Pain in left shoulder: Secondary | ICD-10-CM | POA: Diagnosis not present

## 2018-04-20 DIAGNOSIS — M25511 Pain in right shoulder: Secondary | ICD-10-CM | POA: Diagnosis not present

## 2018-04-25 DIAGNOSIS — D3131 Benign neoplasm of right choroid: Secondary | ICD-10-CM | POA: Diagnosis not present

## 2018-04-25 DIAGNOSIS — H31009 Unspecified chorioretinal scars, unspecified eye: Secondary | ICD-10-CM | POA: Diagnosis not present

## 2018-04-25 DIAGNOSIS — H35412 Lattice degeneration of retina, left eye: Secondary | ICD-10-CM | POA: Diagnosis not present

## 2018-04-25 DIAGNOSIS — H43813 Vitreous degeneration, bilateral: Secondary | ICD-10-CM | POA: Diagnosis not present

## 2018-04-25 DIAGNOSIS — H33322 Round hole, left eye: Secondary | ICD-10-CM | POA: Diagnosis not present

## 2018-05-26 DIAGNOSIS — S8001XA Contusion of right knee, initial encounter: Secondary | ICD-10-CM | POA: Diagnosis not present

## 2018-05-26 DIAGNOSIS — M25561 Pain in right knee: Secondary | ICD-10-CM | POA: Diagnosis not present

## 2018-05-26 DIAGNOSIS — S7001XA Contusion of right hip, initial encounter: Secondary | ICD-10-CM | POA: Diagnosis not present

## 2018-05-26 DIAGNOSIS — M25551 Pain in right hip: Secondary | ICD-10-CM | POA: Diagnosis not present

## 2018-06-29 DIAGNOSIS — Z79899 Other long term (current) drug therapy: Secondary | ICD-10-CM | POA: Diagnosis not present

## 2018-06-29 DIAGNOSIS — Z01419 Encounter for gynecological examination (general) (routine) without abnormal findings: Secondary | ICD-10-CM | POA: Diagnosis not present

## 2018-07-24 DIAGNOSIS — E785 Hyperlipidemia, unspecified: Secondary | ICD-10-CM | POA: Diagnosis not present

## 2018-07-24 DIAGNOSIS — Z79899 Other long term (current) drug therapy: Secondary | ICD-10-CM | POA: Diagnosis not present

## 2018-07-24 DIAGNOSIS — R7301 Impaired fasting glucose: Secondary | ICD-10-CM | POA: Diagnosis not present

## 2018-07-26 DIAGNOSIS — Z85528 Personal history of other malignant neoplasm of kidney: Secondary | ICD-10-CM | POA: Diagnosis not present

## 2018-07-26 DIAGNOSIS — E785 Hyperlipidemia, unspecified: Secondary | ICD-10-CM | POA: Diagnosis not present

## 2018-07-26 DIAGNOSIS — R7301 Impaired fasting glucose: Secondary | ICD-10-CM | POA: Diagnosis not present

## 2018-07-26 DIAGNOSIS — Z86010 Personal history of colonic polyps: Secondary | ICD-10-CM | POA: Diagnosis not present

## 2018-07-26 DIAGNOSIS — Z1389 Encounter for screening for other disorder: Secondary | ICD-10-CM | POA: Diagnosis not present

## 2018-07-26 DIAGNOSIS — M722 Plantar fascial fibromatosis: Secondary | ICD-10-CM | POA: Diagnosis not present

## 2018-07-26 DIAGNOSIS — N189 Chronic kidney disease, unspecified: Secondary | ICD-10-CM | POA: Diagnosis not present

## 2018-07-26 DIAGNOSIS — R8290 Unspecified abnormal findings in urine: Secondary | ICD-10-CM | POA: Diagnosis not present

## 2018-07-26 DIAGNOSIS — Z0000 Encounter for general adult medical examination without abnormal findings: Secondary | ICD-10-CM | POA: Diagnosis not present

## 2018-08-14 DIAGNOSIS — H00024 Hordeolum internum left upper eyelid: Secondary | ICD-10-CM | POA: Diagnosis not present

## 2018-08-14 DIAGNOSIS — H01009 Unspecified blepharitis unspecified eye, unspecified eyelid: Secondary | ICD-10-CM | POA: Diagnosis not present

## 2018-08-29 ENCOUNTER — Other Ambulatory Visit: Payer: Self-pay | Admitting: Sports Medicine

## 2018-08-29 ENCOUNTER — Ambulatory Visit: Payer: PPO | Admitting: Sports Medicine

## 2018-08-29 ENCOUNTER — Ambulatory Visit (INDEPENDENT_AMBULATORY_CARE_PROVIDER_SITE_OTHER): Payer: PPO

## 2018-08-29 ENCOUNTER — Other Ambulatory Visit: Payer: Self-pay

## 2018-08-29 ENCOUNTER — Encounter: Payer: Self-pay | Admitting: Sports Medicine

## 2018-08-29 VITALS — BP 144/67 | HR 66

## 2018-08-29 DIAGNOSIS — M21619 Bunion of unspecified foot: Secondary | ICD-10-CM | POA: Diagnosis not present

## 2018-08-29 DIAGNOSIS — M2041 Other hammer toe(s) (acquired), right foot: Secondary | ICD-10-CM | POA: Diagnosis not present

## 2018-08-29 DIAGNOSIS — M792 Neuralgia and neuritis, unspecified: Secondary | ICD-10-CM

## 2018-08-29 DIAGNOSIS — M79671 Pain in right foot: Secondary | ICD-10-CM | POA: Diagnosis not present

## 2018-08-29 DIAGNOSIS — M79672 Pain in left foot: Secondary | ICD-10-CM | POA: Diagnosis not present

## 2018-08-29 DIAGNOSIS — R29898 Other symptoms and signs involving the musculoskeletal system: Secondary | ICD-10-CM

## 2018-08-29 DIAGNOSIS — M2042 Other hammer toe(s) (acquired), left foot: Secondary | ICD-10-CM | POA: Diagnosis not present

## 2018-08-29 DIAGNOSIS — L909 Atrophic disorder of skin, unspecified: Secondary | ICD-10-CM

## 2018-08-29 MED ORDER — GABAPENTIN 300 MG PO CAPS: 300.0000 mg | ORAL_CAPSULE | Freq: Every day | ORAL | 1 refills | Status: DC

## 2018-08-29 NOTE — Progress Notes (Signed)
Subjective: [name] is a 78 y.o. female patient who presents to office for evaluation of Bilateral foot pain. Patient complains of progressive pain especially over the last several months of a burning pain that keeps her up at night and aching in the AM. Ranks pain 7/10 and is now interferring with daily activities and getting worse since started 2 years ago but last 2 months has been most bothersome even with numbness to the area. Patient has tried shoes and arthritis medicaton with no relief in symptoms. Patient also admits that she has noticed a bump at the top side of the left foot that does not hurt. Patient denies any other pedal complaints. Denies injury/trip/fall/sprain/any causative factors.   Admits to previous left 2nd hammertoe surgery.   Hx of hip fracture with L leg shorter.  Review of Systems  Musculoskeletal: Positive for joint pain.  All other systems reviewed and are negative.    Patient Active Problem List   Diagnosis Date Noted  . Bilateral lower abdominal pain 10/21/2010  . History of diverticulitis of colon 10/21/2010  . History of Clostridium difficile infection 10/21/2010    Current Outpatient Medications on File Prior to Visit  Medication Sig Dispense Refill  . aspirin 81 MG tablet Take 81 mg by mouth daily.      Marland Kitchen CALCIUM & MAGNESIUM CARBONATES PO Take by mouth. 2 daily      . cephALEXin (KEFLEX) 500 MG capsule Take 1 capsule (500 mg total) by mouth 2 (two) times daily. 20 capsule 1  . erythromycin ophthalmic ointment     . estradiol (ESTRACE) 0.5 MG tablet Take 0.5 mg by mouth daily.    . Multiple Vitamins-Minerals (MULTIVITAMIN WITH MINERALS) tablet Take 1 tablet by mouth daily.      . predniSONE (DELTASONE) 5 MG tablet prednisone 5 mg tablets in a dose pack  Take 1 dose pk by oral route for 6 days.    . ranitidine (ZANTAC) 150 MG capsule Take 150 mg by mouth 1 day or 1 dose.    . traMADol (ULTRAM) 50 MG tablet tramadol 50 mg tablet    . vitamin C  (ASCORBIC ACID) 500 MG tablet Take 500 mg by mouth daily.     No current facility-administered medications on file prior to visit.     Allergies  Allergen Reactions  . Codeine Other (See Comments)    Pt unsure if really allergic to this    Objective:  General: Alert and oriented x3 in no acute distress  Dermatology: No open lesions bilateral lower extremities, no webspace macerations, no ecchymosis bilateral, all nails x 10 are well manicured. Prominent bone at left medial midfoot with no pain likely arthritis.   Vascular: Dorsalis Pedis and Posterior Tibial pedal pulses palpable, Capillary Fill Time 5 seconds,(-) pedal hair growth bilateral, no edema bilateral lower extremities, Temperature gradient within normal limits.  Neurology: Gross sensation intact via light touch bilateral, ++Burning pain to both feet.   Musculoskeletal: Mild tenderness with palpation at ball of both feet with fat pad atrophy, Bunion and Hammertoe. Strength within normal limits in all groups bilateral. L leg 1/2 in shorter.  Gait: Antalgic gait  Xrays  Left and Right Foot   Impression:Old screw at 2nd met head from previous foot surgery, bunion and lesser hammertoe, no fracture, no other acute findings.   Assessment and Plan: Problem List Items Addressed This Visit    None    Visit Diagnoses    Neuritis    -  Primary   Relevant Medications   gabapentin (NEURONTIN) 300 MG capsule   Pain in left foot       Pain in right foot       Bunion       Hammer toes of both feet       Fat pad atrophy of foot           -Complete examination performed -Xrays reviewed -Discussed treatement options for neuritis with fat pad changes unrelated to previous Left 2nd hammertoe -Rx Gabapentin 341m QHS at bedtime -Dispensed metatarsal sleeve padding to use as instructed during the day with good supportive shoes  -Patient to return to in 1 month for med check or sooner if condition worsens.  TLandis Martins  DPM

## 2018-09-12 ENCOUNTER — Telehealth: Payer: Self-pay | Admitting: Sports Medicine

## 2018-09-12 NOTE — Telephone Encounter (Signed)
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 Pt called and was instructed to contact the pharmacist to discuss because Dr. Cannon Kettle was not in the office. Pt states the pharmacist said it was a medication she maybe should not take, so she didn't take it last night. Pt states she felt the gabapentin was helping. I told pt I would inform Dr. Cannon Kettle and call again.

## 2018-09-12 NOTE — Telephone Encounter (Signed)
Patient was seen on 08/29/18 and was prescribed Gabapentin but believes this is not a medication she can take. Patient would like a call back from the nurse.

## 2018-09-12 NOTE — Telephone Encounter (Signed)
I informed pt of Dr. Leeanne Rio recommendation and pt states understanding.

## 2018-09-12 NOTE — Telephone Encounter (Signed)
Patient can take Gabapentin every other night to see if this help prevent any issues with swelling. However it does not typically cause swelling. Recommend if swelling continues to call her PCP to discuss other possible causes. -Dr. Cannon Kettle

## 2018-09-26 ENCOUNTER — Ambulatory Visit: Payer: PPO | Admitting: Sports Medicine

## 2018-09-28 DIAGNOSIS — M75121 Complete rotator cuff tear or rupture of right shoulder, not specified as traumatic: Secondary | ICD-10-CM | POA: Diagnosis not present

## 2018-09-28 DIAGNOSIS — M25511 Pain in right shoulder: Secondary | ICD-10-CM | POA: Diagnosis not present

## 2018-09-28 DIAGNOSIS — M25512 Pain in left shoulder: Secondary | ICD-10-CM | POA: Diagnosis not present

## 2018-10-20 DIAGNOSIS — H0102B Squamous blepharitis left eye, upper and lower eyelids: Secondary | ICD-10-CM | POA: Diagnosis not present

## 2018-10-20 DIAGNOSIS — H0102A Squamous blepharitis right eye, upper and lower eyelids: Secondary | ICD-10-CM | POA: Diagnosis not present

## 2018-10-20 DIAGNOSIS — H00021 Hordeolum internum right upper eyelid: Secondary | ICD-10-CM | POA: Diagnosis not present

## 2018-10-31 ENCOUNTER — Ambulatory Visit (INDEPENDENT_AMBULATORY_CARE_PROVIDER_SITE_OTHER): Payer: PPO | Admitting: Sports Medicine

## 2018-10-31 ENCOUNTER — Encounter: Payer: Self-pay | Admitting: Sports Medicine

## 2018-10-31 ENCOUNTER — Other Ambulatory Visit: Payer: Self-pay

## 2018-10-31 VITALS — Temp 97.7°F

## 2018-10-31 DIAGNOSIS — M79671 Pain in right foot: Secondary | ICD-10-CM

## 2018-10-31 DIAGNOSIS — M792 Neuralgia and neuritis, unspecified: Secondary | ICD-10-CM | POA: Diagnosis not present

## 2018-10-31 DIAGNOSIS — M79672 Pain in left foot: Secondary | ICD-10-CM

## 2018-10-31 NOTE — Progress Notes (Signed)
Subjective: [name] is a 78 y.o. female patient returns to office for burning pain bilateral L>R especially at ball. Reports she took Gabapentin for 2 days but then stopped because she wasn't sure how it would effect her 1 kidney. Patient reports that it was helping as well as cream. Denies any other symptoms.   Review of Systems  Neurological: Positive for tingling.  All other systems reviewed and are negative.      Patient Active Problem List   Diagnosis Date Noted  . Pain in right knee 05/26/2018  . Bilateral lower abdominal pain 10/21/2010  . History of diverticulitis of colon 10/21/2010  . History of Clostridium difficile infection 10/21/2010    Current Outpatient Medications on File Prior to Visit  Medication Sig Dispense Refill  . aspirin 81 MG tablet Take 81 mg by mouth daily.      Marland Kitchen CALCIUM & MAGNESIUM CARBONATES PO Take by mouth. 2 daily      . cephALEXin (KEFLEX) 500 MG capsule Take 1 capsule (500 mg total) by mouth 2 (two) times daily. 20 capsule 1  . doxycycline (VIBRA-TABS) 100 MG tablet     . erythromycin ophthalmic ointment     . estradiol (ESTRACE) 0.5 MG tablet Take 0.5 mg by mouth daily.    Marland Kitchen gabapentin (NEURONTIN) 300 MG capsule Take 1 capsule (300 mg total) by mouth at bedtime. 30 capsule 1  . Multiple Vitamins-Minerals (MULTIVITAMIN WITH MINERALS) tablet Take 1 tablet by mouth daily.      Marland Kitchen neomycin-polymyxin b-dexamethasone (MAXITROL) 3.5-10000-0.1 OINT     . omeprazole (PRILOSEC) 40 MG capsule     . predniSONE (DELTASONE) 5 MG tablet prednisone 5 mg tablets in a dose pack  Take 1 dose pk by oral route for 6 days.    . ranitidine (ZANTAC) 150 MG capsule Take 150 mg by mouth 1 day or 1 dose.    . traMADol (ULTRAM) 50 MG tablet tramadol 50 mg tablet    . vitamin C (ASCORBIC ACID) 500 MG tablet Take 500 mg by mouth daily.     No current facility-administered medications on file prior to visit.     Allergies  Allergen Reactions  . Codeine Other  (See Comments)    Pt unsure if really allergic to this    Objective:  General: Alert and oriented x3 in no acute distress  Dermatology: No open lesions bilateral lower extremities, no webspace macerations, no ecchymosis bilateral, all nails x 10 are well manicured. Prominent bone at left medial midfoot with no pain likely arthritis like prior.   Vascular: Dorsalis Pedis and Posterior Tibial pedal pulses palpable, Capillary Fill Time 5 seconds,(-) pedal hair growth bilateral, no edema bilateral lower extremities, Temperature gradient within normal limits.  Neurology: Gross sensation intact via light touch bilateral, ++Burning pain to both feet.   Musculoskeletal: Mild tenderness with palpation at ball of both feet with fat pad atrophy, Bunion and Hammertoe L>R. Strength within normal limits in all groups bilateral. L leg 1/2 in shorter.  Assessment and Plan: Problem List Items Addressed This Visit    None    Visit Diagnoses    Neuritis    -  Primary   Pain in left foot       Pain in right foot           -Complete examination performed -Discussed treatement options for neuritis  -Advised patient to have labs faxed from PCP for me to review meanwhile I believe that Gabapentin is safe  my slowly tritrate from QOD to  Gabapentin 300mg  QHS at bedtime daily  -Continue with metatarsal sleeve padding to use as instructed during the day with good supportive shoes  -Patient to return to in 4-6 weeks for med check or sooner if condition worsens.  Landis Martins, DPM

## 2018-11-02 ENCOUNTER — Telehealth: Payer: Self-pay | Admitting: *Deleted

## 2018-11-02 NOTE — Telephone Encounter (Signed)
I called pt and informed of Dr. Leeanne Rio 11/02/2018 7:21am orders. Pt states she had taken about 15 gabapentins before she quit, and she took one night before last (Tuesday night) at supper and it made her dizzy, when it hadn't done that previously. I told pt she may want to take the Gabapentin right at bedtime so if she was dizzy it would be while she was asleep.

## 2018-11-02 NOTE — Telephone Encounter (Signed)
Thanks Dr. Correy Weidner!!! ?

## 2018-11-02 NOTE — Telephone Encounter (Signed)
-----   Message from Landis Martins, [place] sent at 11/02/2018  7:21 AM EDT ----- Regarding: Lab work Val Will you let patient know that I have reviewed her lab work that she had back in Feb with her PCP. Her kidney function and all other pertinent labs are normal. She should continue with taking  Gabapentin as I have directed this week every other day and then next week every night 300mg  until her next check up with me. Thanks Dr. Cannon Kettle

## 2018-11-03 DIAGNOSIS — H0102A Squamous blepharitis right eye, upper and lower eyelids: Secondary | ICD-10-CM | POA: Diagnosis not present

## 2018-11-03 DIAGNOSIS — H0102B Squamous blepharitis left eye, upper and lower eyelids: Secondary | ICD-10-CM | POA: Diagnosis not present

## 2018-11-03 DIAGNOSIS — H00021 Hordeolum internum right upper eyelid: Secondary | ICD-10-CM | POA: Diagnosis not present

## 2018-12-12 ENCOUNTER — Ambulatory Visit: Payer: PPO | Admitting: Sports Medicine

## 2018-12-21 DIAGNOSIS — M25512 Pain in left shoulder: Secondary | ICD-10-CM | POA: Diagnosis not present

## 2018-12-21 DIAGNOSIS — M25511 Pain in right shoulder: Secondary | ICD-10-CM | POA: Diagnosis not present

## 2018-12-28 DIAGNOSIS — H35372 Puckering of macula, left eye: Secondary | ICD-10-CM | POA: Diagnosis not present

## 2018-12-28 DIAGNOSIS — Z961 Presence of intraocular lens: Secondary | ICD-10-CM | POA: Diagnosis not present

## 2018-12-28 DIAGNOSIS — H0102B Squamous blepharitis left eye, upper and lower eyelids: Secondary | ICD-10-CM | POA: Diagnosis not present

## 2018-12-28 DIAGNOSIS — H04123 Dry eye syndrome of bilateral lacrimal glands: Secondary | ICD-10-CM | POA: Diagnosis not present

## 2018-12-28 DIAGNOSIS — H0102A Squamous blepharitis right eye, upper and lower eyelids: Secondary | ICD-10-CM | POA: Diagnosis not present

## 2019-01-02 ENCOUNTER — Other Ambulatory Visit: Payer: Self-pay

## 2019-01-02 ENCOUNTER — Encounter: Payer: Self-pay | Admitting: Sports Medicine

## 2019-01-02 ENCOUNTER — Ambulatory Visit: Payer: PPO | Admitting: Sports Medicine

## 2019-01-02 VITALS — Temp 97.8°F | Resp 16

## 2019-01-02 DIAGNOSIS — M792 Neuralgia and neuritis, unspecified: Secondary | ICD-10-CM | POA: Diagnosis not present

## 2019-01-02 DIAGNOSIS — M79672 Pain in left foot: Secondary | ICD-10-CM | POA: Diagnosis not present

## 2019-01-02 DIAGNOSIS — M79671 Pain in right foot: Secondary | ICD-10-CM | POA: Diagnosis not present

## 2019-01-02 NOTE — Progress Notes (Signed)
Subjective: [name] is a 78 y.o. female patient returns to office for burning pain bilateral L>R especially at ball all over to her feet and legs states that sometimes the pain is worse at night has difficulty resting. Reports she took that she tried to take the gabapentin a lot longer and she could not deal with the dizziness states that she has been using her padding and over-the-counter cream reports that her feet feels best when her husband massages them and is wondering what more can be done about the burning pain and both her feet and lower legs with difficulty resting and keeping her leg still at night. Patient reports that the gabapentin was helping but cannot take due to the side effect feels like the side effect is worse than it actually helping for her problem.  Denies any other symptoms.    Patient Active Problem List   Diagnosis Date Noted  . Pain in right knee 05/26/2018  . Bilateral lower abdominal pain 10/21/2010  . History of diverticulitis of colon 10/21/2010  . History of Clostridium difficile infection 10/21/2010    Current Outpatient Medications on File Prior to Visit  Medication Sig Dispense Refill  . aspirin 81 MG tablet Take 81 mg by mouth daily.      Marland Kitchen CALCIUM & MAGNESIUM CARBONATES PO Take by mouth. 2 daily      . cephALEXin (KEFLEX) 500 MG capsule Take 1 capsule (500 mg total) by mouth 2 (two) times daily. 20 capsule 1  . doxycycline (VIBRA-TABS) 100 MG tablet     . erythromycin ophthalmic ointment     . estradiol (ESTRACE) 0.5 MG tablet Take 0.5 mg by mouth daily.    Marland Kitchen gabapentin (NEURONTIN) 300 MG capsule Take 1 capsule (300 mg total) by mouth at bedtime. 30 capsule 1  . Multiple Vitamins-Minerals (MULTIVITAMIN WITH MINERALS) tablet Take 1 tablet by mouth daily.      Marland Kitchen neomycin-polymyxin b-dexamethasone (MAXITROL) 3.5-10000-0.1 OINT     . omeprazole (PRILOSEC) 40 MG capsule     . predniSONE (DELTASONE) 5 MG tablet prednisone 5 mg tablets in a dose pack  Take 1 dose pk by oral route for 6 days.    . ranitidine (ZANTAC) 150 MG capsule Take 150 mg by mouth 1 day or 1 dose.    . traMADol (ULTRAM) 50 MG tablet tramadol 50 mg tablet    . vitamin C (ASCORBIC ACID) 500 MG tablet Take 500 mg by mouth daily.     No current facility-administered medications on file prior to visit.     Allergies  Allergen Reactions  . Codeine Other (See Comments)    Pt unsure if really allergic to this    Objective:  General: Alert and oriented x3 in no acute distress  Dermatology: No open lesions bilateral lower extremities, no webspace macerations, no ecchymosis bilateral, all nails x 10 are well manicured. Prominent bone at left medial midfoot with no pain likely arthritis like prior.   Vascular: Dorsalis Pedis and Posterior Tibial pedal pulses palpable, Capillary Fill Time 5 seconds,(-) pedal hair growth bilateral, no edema bilateral lower extremities, Temperature gradient within normal limits.  Neurology: Gross sensation intact via light touch bilateral, ++Burning pain to both feet.   Musculoskeletal: Mild tenderness with palpation at ball of both feet with fat pad atrophy and diffuse burning as above, Bunion and Hammertoe L>R. Strength within normal limits in all groups bilateral. L leg 1/2 in shorter.  Assessment and Plan: Problem List Items Addressed This  Visit    None    Visit Diagnoses    Neuritis    -  Primary   Pain in left foot       Pain in right foot           -Complete examination performed -Re-Discussed treatement options for neuritis  -Advised that there are not great alternatives for nerve pain would be better served for a further work-up with the neurologist and possibly nerve studies to identify a possible source for her nerve pain; however at this time patient would like to wait and does not want to see a neurologist at this time and wants to see how her feet continue to feel before she makes a decision if she wants to proceed with a  neurological evaluation -Continue with metatarsal sleeve padding to use as instructed during the day with good supportive shoes  -Patient to return to office as needed or sooner if condition worsens.  Landis Martins, DPM

## 2019-01-03 ENCOUNTER — Other Ambulatory Visit: Payer: Self-pay | Admitting: Family Medicine

## 2019-01-03 DIAGNOSIS — Z1231 Encounter for screening mammogram for malignant neoplasm of breast: Secondary | ICD-10-CM

## 2019-02-21 ENCOUNTER — Ambulatory Visit
Admission: RE | Admit: 2019-02-21 | Discharge: 2019-02-21 | Disposition: A | Discharge status: Home / Self Care | Payer: PPO | Source: Ambulatory Visit | Attending: Family Medicine | Admitting: Family Medicine

## 2019-02-21 ENCOUNTER — Other Ambulatory Visit: Payer: Self-pay

## 2019-02-21 DIAGNOSIS — Z1231 Encounter for screening mammogram for malignant neoplasm of breast: Secondary | ICD-10-CM

## 2019-03-21 DIAGNOSIS — M25512 Pain in left shoulder: Secondary | ICD-10-CM | POA: Diagnosis not present

## 2019-03-21 DIAGNOSIS — M25511 Pain in right shoulder: Secondary | ICD-10-CM | POA: Diagnosis not present

## 2019-05-01 DIAGNOSIS — H35373 Puckering of macula, bilateral: Secondary | ICD-10-CM | POA: Diagnosis not present

## 2019-05-01 DIAGNOSIS — H43813 Vitreous degeneration, bilateral: Secondary | ICD-10-CM | POA: Diagnosis not present

## 2019-05-01 DIAGNOSIS — D3131 Benign neoplasm of right choroid: Secondary | ICD-10-CM | POA: Diagnosis not present

## 2019-05-01 DIAGNOSIS — H43393 Other vitreous opacities, bilateral: Secondary | ICD-10-CM | POA: Diagnosis not present

## 2019-07-03 DIAGNOSIS — Z01419 Encounter for gynecological examination (general) (routine) without abnormal findings: Secondary | ICD-10-CM | POA: Diagnosis not present

## 2019-07-03 DIAGNOSIS — Z9229 Personal history of other drug therapy: Secondary | ICD-10-CM | POA: Diagnosis not present

## 2019-07-09 ENCOUNTER — Ambulatory Visit: Payer: PPO | Attending: Internal Medicine

## 2019-07-09 DIAGNOSIS — Z23 Encounter for immunization: Secondary | ICD-10-CM | POA: Insufficient documentation

## 2019-07-09 NOTE — Progress Notes (Signed)
   Covid-19 Vaccination Clinic  Name:  [name]    MRN: [id number] DOB: Oct 17, 1940  07/09/2019  [name] was observed post Covid-19 immunization for 15 minutes without incidence. She was provided with Vaccine Information Sheet and instruction to access the V-Safe system.   [name] was instructed to call 911 with any severe reactions post vaccine: Marland Kitchen Difficulty breathing  . Swelling of your face and throat  . A fast heartbeat  . A bad rash all over your body  . Dizziness and weakness    Immunizations Administered    Name Date Dose VIS Date Route   Pfizer COVID-19 Vaccine 07/09/2019  9:54 AM 0.3 mL 05/25/2019 Intramuscular   Manufacturer: [place]   Lot: [id number]   [place]: [id number]

## 2019-07-19 DIAGNOSIS — M25512 Pain in left shoulder: Secondary | ICD-10-CM | POA: Diagnosis not present

## 2019-07-19 DIAGNOSIS — M19011 Primary osteoarthritis, right shoulder: Secondary | ICD-10-CM | POA: Diagnosis not present

## 2019-07-19 DIAGNOSIS — M25511 Pain in right shoulder: Secondary | ICD-10-CM | POA: Diagnosis not present

## 2019-07-30 ENCOUNTER — Ambulatory Visit: Payer: PPO | Attending: Internal Medicine

## 2019-07-30 DIAGNOSIS — Z23 Encounter for immunization: Secondary | ICD-10-CM | POA: Insufficient documentation

## 2019-07-30 NOTE — Progress Notes (Signed)
   Covid-19 Vaccination Clinic  Name:  [name]    MRN: [id number] DOB: [birth year]  07/30/2019  [name] was observed post Covid-19 immunization for 15 minutes without incidence. She was provided with Vaccine Information Sheet and instruction to access the V-Safe system.   [name] was instructed to call 911 with any severe reactions post vaccine: Marland Kitchen Difficulty breathing  . Swelling of your face and throat  . A fast heartbeat  . A bad rash all over your body  . Dizziness and weakness    Immunizations Administered    Name Date Dose VIS Date Route   Pfizer COVID-19 Vaccine 07/30/2019  9:57 AM 0.3 mL 05/25/2019 Intramuscular   Manufacturer: [place]   Lot: [id number]   [place]: [id number]

## 2019-08-20 DIAGNOSIS — Z79899 Other long term (current) drug therapy: Secondary | ICD-10-CM | POA: Diagnosis not present

## 2019-08-20 DIAGNOSIS — E785 Hyperlipidemia, unspecified: Secondary | ICD-10-CM | POA: Diagnosis not present

## 2019-08-20 DIAGNOSIS — R7301 Impaired fasting glucose: Secondary | ICD-10-CM | POA: Diagnosis not present

## 2019-08-23 DIAGNOSIS — E785 Hyperlipidemia, unspecified: Secondary | ICD-10-CM | POA: Diagnosis not present

## 2019-08-23 DIAGNOSIS — N189 Chronic kidney disease, unspecified: Secondary | ICD-10-CM | POA: Diagnosis not present

## 2019-08-23 DIAGNOSIS — R7301 Impaired fasting glucose: Secondary | ICD-10-CM | POA: Diagnosis not present

## 2019-08-23 DIAGNOSIS — Z86010 Personal history of colonic polyps: Secondary | ICD-10-CM | POA: Diagnosis not present

## 2019-08-23 DIAGNOSIS — R8290 Unspecified abnormal findings in urine: Secondary | ICD-10-CM | POA: Diagnosis not present

## 2019-08-23 DIAGNOSIS — Z0000 Encounter for general adult medical examination without abnormal findings: Secondary | ICD-10-CM | POA: Diagnosis not present

## 2019-08-23 DIAGNOSIS — Z85528 Personal history of other malignant neoplasm of kidney: Secondary | ICD-10-CM | POA: Diagnosis not present

## 2019-08-23 DIAGNOSIS — K219 Gastro-esophageal reflux disease without esophagitis: Secondary | ICD-10-CM | POA: Diagnosis not present

## 2019-08-23 DIAGNOSIS — Z79899 Other long term (current) drug therapy: Secondary | ICD-10-CM | POA: Diagnosis not present

## 2019-09-05 DIAGNOSIS — D485 Neoplasm of uncertain behavior of skin: Secondary | ICD-10-CM | POA: Diagnosis not present

## 2019-09-05 DIAGNOSIS — D225 Melanocytic nevi of trunk: Secondary | ICD-10-CM | POA: Diagnosis not present

## 2019-09-05 DIAGNOSIS — D2271 Melanocytic nevi of right lower limb, including hip: Secondary | ICD-10-CM | POA: Diagnosis not present

## 2019-09-05 DIAGNOSIS — Z85828 Personal history of other malignant neoplasm of skin: Secondary | ICD-10-CM | POA: Diagnosis not present

## 2019-09-05 DIAGNOSIS — L430 Hypertrophic lichen planus: Secondary | ICD-10-CM | POA: Diagnosis not present

## 2019-09-05 DIAGNOSIS — L738 Other specified follicular disorders: Secondary | ICD-10-CM | POA: Diagnosis not present

## 2019-09-05 DIAGNOSIS — L814 Other melanin hyperpigmentation: Secondary | ICD-10-CM | POA: Diagnosis not present

## 2019-09-05 DIAGNOSIS — L821 Other seborrheic keratosis: Secondary | ICD-10-CM | POA: Diagnosis not present

## 2019-09-05 DIAGNOSIS — D2272 Melanocytic nevi of left lower limb, including hip: Secondary | ICD-10-CM | POA: Diagnosis not present

## 2019-09-05 DIAGNOSIS — L438 Other lichen planus: Secondary | ICD-10-CM | POA: Diagnosis not present

## 2019-11-14 DIAGNOSIS — H0012 Chalazion right lower eyelid: Secondary | ICD-10-CM | POA: Diagnosis not present

## 2019-11-27 DIAGNOSIS — M25512 Pain in left shoulder: Secondary | ICD-10-CM | POA: Diagnosis not present

## 2019-11-27 DIAGNOSIS — M25511 Pain in right shoulder: Secondary | ICD-10-CM | POA: Diagnosis not present

## 2019-12-05 ENCOUNTER — Other Ambulatory Visit: Payer: Self-pay | Admitting: Physician Assistant

## 2019-12-05 DIAGNOSIS — M25512 Pain in left shoulder: Secondary | ICD-10-CM

## 2019-12-05 DIAGNOSIS — M25511 Pain in right shoulder: Secondary | ICD-10-CM

## 2019-12-25 DIAGNOSIS — M25511 Pain in right shoulder: Secondary | ICD-10-CM | POA: Diagnosis not present

## 2019-12-25 DIAGNOSIS — M25512 Pain in left shoulder: Secondary | ICD-10-CM | POA: Diagnosis not present

## 2019-12-31 DIAGNOSIS — D3132 Benign neoplasm of left choroid: Secondary | ICD-10-CM | POA: Diagnosis not present

## 2019-12-31 DIAGNOSIS — D3131 Benign neoplasm of right choroid: Secondary | ICD-10-CM | POA: Diagnosis not present

## 2019-12-31 DIAGNOSIS — Z961 Presence of intraocular lens: Secondary | ICD-10-CM | POA: Diagnosis not present

## 2019-12-31 DIAGNOSIS — H0102A Squamous blepharitis right eye, upper and lower eyelids: Secondary | ICD-10-CM | POA: Diagnosis not present

## 2020-01-09 DIAGNOSIS — M19111 Post-traumatic osteoarthritis, right shoulder: Secondary | ICD-10-CM | POA: Diagnosis not present

## 2020-01-09 DIAGNOSIS — M25511 Pain in right shoulder: Secondary | ICD-10-CM | POA: Diagnosis not present

## 2020-01-09 DIAGNOSIS — M25512 Pain in left shoulder: Secondary | ICD-10-CM | POA: Diagnosis not present

## 2020-01-11 ENCOUNTER — Other Ambulatory Visit: Payer: Self-pay | Admitting: Family Medicine

## 2020-01-11 DIAGNOSIS — Z1231 Encounter for screening mammogram for malignant neoplasm of breast: Secondary | ICD-10-CM

## 2020-01-31 DIAGNOSIS — M25511 Pain in right shoulder: Secondary | ICD-10-CM | POA: Diagnosis not present

## 2020-01-31 DIAGNOSIS — M25512 Pain in left shoulder: Secondary | ICD-10-CM | POA: Diagnosis not present

## 2020-02-22 ENCOUNTER — Ambulatory Visit
Admission: RE | Admit: 2020-02-22 | Discharge: 2020-02-22 | Disposition: A | Discharge status: Home / Self Care | Payer: PPO | Source: Ambulatory Visit | Attending: Family Medicine | Admitting: Family Medicine

## 2020-02-22 ENCOUNTER — Other Ambulatory Visit: Payer: Self-pay

## 2020-02-22 DIAGNOSIS — Z1231 Encounter for screening mammogram for malignant neoplasm of breast: Secondary | ICD-10-CM

## 2020-03-27 DIAGNOSIS — Z79899 Other long term (current) drug therapy: Secondary | ICD-10-CM | POA: Diagnosis not present

## 2020-03-27 DIAGNOSIS — Z85528 Personal history of other malignant neoplasm of kidney: Secondary | ICD-10-CM | POA: Diagnosis not present

## 2020-03-27 DIAGNOSIS — Z0000 Encounter for general adult medical examination without abnormal findings: Secondary | ICD-10-CM | POA: Diagnosis not present

## 2020-03-27 DIAGNOSIS — E785 Hyperlipidemia, unspecified: Secondary | ICD-10-CM | POA: Diagnosis not present

## 2020-03-27 DIAGNOSIS — Z131 Encounter for screening for diabetes mellitus: Secondary | ICD-10-CM | POA: Diagnosis not present

## 2020-03-27 DIAGNOSIS — R7301 Impaired fasting glucose: Secondary | ICD-10-CM | POA: Diagnosis not present

## 2020-03-27 DIAGNOSIS — K219 Gastro-esophageal reflux disease without esophagitis: Secondary | ICD-10-CM | POA: Diagnosis not present

## 2020-03-27 DIAGNOSIS — N189 Chronic kidney disease, unspecified: Secondary | ICD-10-CM | POA: Diagnosis not present

## 2020-03-27 DIAGNOSIS — Z86010 Personal history of colonic polyps: Secondary | ICD-10-CM | POA: Diagnosis not present

## 2020-03-27 DIAGNOSIS — R8290 Unspecified abnormal findings in urine: Secondary | ICD-10-CM | POA: Diagnosis not present

## 2020-04-17 DIAGNOSIS — M25511 Pain in right shoulder: Secondary | ICD-10-CM | POA: Diagnosis not present

## 2020-04-29 ENCOUNTER — Other Ambulatory Visit: Payer: Self-pay

## 2020-04-29 ENCOUNTER — Encounter (INDEPENDENT_AMBULATORY_CARE_PROVIDER_SITE_OTHER): Payer: Self-pay | Admitting: Ophthalmology

## 2020-04-29 ENCOUNTER — Ambulatory Visit (INDEPENDENT_AMBULATORY_CARE_PROVIDER_SITE_OTHER): Payer: PPO | Admitting: Ophthalmology

## 2020-04-29 DIAGNOSIS — D3131 Benign neoplasm of right choroid: Secondary | ICD-10-CM | POA: Insufficient documentation

## 2020-04-29 DIAGNOSIS — H35412 Lattice degeneration of retina, left eye: Secondary | ICD-10-CM | POA: Diagnosis not present

## 2020-04-29 DIAGNOSIS — H43813 Vitreous degeneration, bilateral: Secondary | ICD-10-CM | POA: Insufficient documentation

## 2020-04-29 DIAGNOSIS — H59812 Chorioretinal scars after surgery for detachment, left eye: Secondary | ICD-10-CM

## 2020-04-29 DIAGNOSIS — H35372 Puckering of macula, left eye: Secondary | ICD-10-CM | POA: Diagnosis not present

## 2020-04-29 NOTE — Assessment & Plan Note (Signed)
History of retinal tear left eye, no new breaks

## 2020-04-29 NOTE — Progress Notes (Signed)
04/29/2020     CHIEF COMPLAINT Patient presents for Retina Follow Up   HISTORY OF PRESENT ILLNESS: [name] is a 79 y.o. female who presents to the clinic today for:   HPI    Retina Follow Up    Patient presents with  PVD (ERM OS, Lattice Deg OS).  In both eyes.  Severity is moderate.  Duration of 1 year.  Since onset it is stable.  I, the attending physician,  performed the HPI with the patient and updated documentation appropriately.          Comments    1 Year f\u OU. FP  Pt c/o OU being very watery, especially in the AM when she wakes up. Pt c/o seeing multiple floaters OU. Pt states she has noticed a "shade" in the outside corner of OS that comes and goes.       Last edited by Tilda Franco on 04/29/2020 10:04 AM. (History)      Referring physician: Hulan Fess, MD [place],  [place] [address]  HISTORICAL INFORMATION:   Selected notes from the MEDICAL RECORD NUMBER       CURRENT MEDICATIONS: Current Outpatient Medications (Ophthalmic Drugs)  Medication Sig  . erythromycin ophthalmic ointment   . neomycin-polymyxin b-dexamethasone (MAXITROL) 3.5-10000-0.1 OINT    No current facility-administered medications for this visit. (Ophthalmic Drugs)   Current Outpatient Medications (Other)  Medication Sig  . aspirin 81 MG tablet Take 81 mg by mouth daily.    Marland Kitchen CALCIUM & MAGNESIUM CARBONATES PO Take by mouth. 2 daily    . cephALEXin (KEFLEX) 500 MG capsule Take 1 capsule (500 mg total) by mouth 2 (two) times daily.  Marland Kitchen doxycycline (VIBRA-TABS) 100 MG tablet   . estradiol (ESTRACE) 0.5 MG tablet Take 0.5 mg by mouth daily.  Marland Kitchen gabapentin (NEURONTIN) 300 MG capsule Take 1 capsule (300 mg total) by mouth at bedtime.  . Multiple Vitamins-Minerals (MULTIVITAMIN WITH MINERALS) tablet Take 1 tablet by mouth daily.    Marland Kitchen omeprazole (PRILOSEC) 40 MG capsule   . predniSONE (DELTASONE) 5 MG tablet prednisone 5 mg tablets in a dose pack  Take 1 dose  pk by oral route for 6 days.  . ranitidine (ZANTAC) 150 MG capsule Take 150 mg by mouth 1 day or 1 dose.  . traMADol (ULTRAM) 50 MG tablet tramadol 50 mg tablet  . vitamin C (ASCORBIC ACID) 500 MG tablet Take 500 mg by mouth daily.   No current facility-administered medications for this visit. (Other)      REVIEW OF SYSTEMS:    ALLERGIES Allergies  Allergen Reactions  . Codeine Other (See Comments)    Pt unsure if really allergic to this    PAST MEDICAL HISTORY Past Medical History:  Diagnosis Date  . Blood transfusion without reported diagnosis    with hip fracture-79 years old  . Cancer [facility]) 1983   Kidney Cancer  . Diverticulosis   . GERD (gastroesophageal reflux disease)   . Personal history of kidney cancer    Past Surgical History:  Procedure Laterality Date  . ABDOMINAL HYSTERECTOMY    . COLON SURGERY     Severe sigmoid diverticulitis  . COLONOSCOPY    . FOOT SURGERY    . HIP SURGERY    . Kidney cancer     Kidney removed  . POLYPECTOMY      FAMILY HISTORY Family History  Problem Relation Age of Onset  . Colon cancer Mother   .  Coronary artery disease Father   . Breast cancer Neg Hx     SOCIAL HISTORY Social History   Tobacco Use  . Smoking status: Never Smoker  . Smokeless tobacco: Never Used  Substance Use Topics  . Alcohol use: No  . Drug use: No         OPHTHALMIC EXAM:  Base Eye Exam    Visual Acuity (Snellen - Linear)      Right Left   Dist cc 20/25 -1 20/40   Dist ph cc  20/25   Correction: Glasses       Tonometry (Tonopen, 10:10 AM)      Right Left   Pressure 12 14       Pupils      Pupils Dark Light Shape React APD   Right PERRL 4 3 Round Brisk None   Left PERRL 4 3 Round Brisk None       Visual Fields (Counting fingers)      Left Right    Full Full       Neuro/Psych    Oriented x3: Yes   Mood/Affect: Normal       Dilation    Both eyes: 1.0% Mydriacyl, 2.5% Phenylephrine @ 10:10 AM        Slit Lamp  and Fundus Exam    External Exam      Right Left   External Normal Normal       Slit Lamp Exam      Right Left   Lids/Lashes Normal Normal   Conjunctiva/Sclera White and quiet White and quiet   Cornea Clear Clear   Anterior Chamber Deep and quiet Deep and quiet   Iris Round and reactive Round and reactive   Lens Posterior chamber intraocular lens Posterior chamber intraocular lens   Anterior Vitreous Normal Normal       Fundus Exam      Right Left   Posterior Vitreous Posterior vitreous detachment Posterior vitreous detachment   Disc Normal Normal   C/D Ratio 0.2 0.2   Macula Normal Normal   Vessels Normal Normal   Periphery Choroidal nevus inferonasal to the nerve right eye, flat no subretinal fluid stable, 2.5 x 2.5 disc diameters 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   Left Eye Progression has been stable. Macula : normal observations. Vessels : normal observations.   Notes OS good retinopexy superotemporally, old retinal tear, no new retinal breaks.  OD, benign choroidal nevus inferonasal to nerve, no interval change compared to prior photographs for the last 5 years                ASSESSMENT/PLAN:  Benign neoplasm of choroid, right No interval change, benign choroidal nevus inferonasal right eye no high risk features, no fluid, no halo of atrophy, no lipofuscin, no thickness, regular pigmentation  Left epiretinal membrane Weiner no impact on acuity  Left retinal lattice degeneration History of retinal tear left eye, no new breaks      ICD-10-CM   1. Benign neoplasm of choroid, right  D31.31 Color Fundus Photography Optos - OU - Both Eyes  2. Posterior vitreous detachment of both eyes  H43.813   3. 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 Postsurgical  chorioretinal scar of left  eye  H59.812   5. Left epiretinal membrane  H35.372     1.  Stable nevus, choroidal OD observe  2.  No new retinal breaks OU  3.  Ophthalmic Meds Ordered this visit:  No orders of the defined types were placed in this encounter.      Return in about 1 year (around 04/29/2021) for COLOR FP, DILATE OU, OCT.  There are no Patient Instructions on file for this visit.   Explained the diagnoses, plan, and follow up with the patient and they expressed understanding.  Patient expressed understanding of the importance of proper follow up care.   Clent Demark Claude Waldman M.D. Diseases & Surgery of the Retina and Vitreous Retina & Diabetic [place] 04/29/20     Abbreviations: M myopia (nearsighted); A astigmatism; H hyperopia (farsighted); P presbyopia; Mrx spectacle prescription;  CTL contact lenses; OD right eye; OS left eye; OU both eyes  XT exotropia; ET esotropia; PEK punctate epithelial keratitis; PEE punctate epithelial erosions; DES dry eye syndrome; MGD meibomian gland dysfunction; ATs artificial tears; PFAT's preservative free artificial tears; [place] nuclear sclerotic cataract; PSC posterior subcapsular cataract; ERM epi-retinal membrane; PVD posterior vitreous detachment; RD retinal detachment; DM diabetes mellitus; DR diabetic retinopathy; NPDR non-proliferative diabetic retinopathy; PDR proliferative diabetic retinopathy; CSME clinically significant macular edema; DME diabetic macular edema; dbh dot blot hemorrhages; CWS cotton wool spot; POAG primary open angle glaucoma; C/D cup-to-disc ratio; HVF humphrey visual field; GVF goldmann visual field; OCT optical coherence tomography; IOP intraocular pressure; BRVO Branch retinal vein occlusion; CRVO central retinal vein occlusion; CRAO central retinal artery occlusion; BRAO branch retinal artery occlusion; RT retinal tear; SB scleral buckle; PPV pars plana vitrectomy; VH Vitreous hemorrhage; PRP panretinal laser  photocoagulation; IVK intravitreal kenalog; VMT vitreomacular traction; MH Macular hole;  NVD neovascularization of the disc; NVE neovascularization elsewhere; AREDS age related eye disease study; ARMD age related macular degeneration; POAG primary open angle glaucoma; EBMD epithelial/anterior basement membrane dystrophy; ACIOL anterior chamber intraocular lens; IOL intraocular lens; PCIOL posterior chamber intraocular lens; Phaco/IOL phacoemulsification with intraocular lens placement; [place] photorefractive keratectomy; LASIK laser assisted in situ keratomileusis; HTN hypertension; DM diabetes mellitus; COPD chronic obstructive pulmonary disease

## 2020-04-29 NOTE — Assessment & Plan Note (Signed)
Weiner no impact on acuity

## 2020-04-29 NOTE — Assessment & Plan Note (Signed)
No interval change, benign choroidal nevus inferonasal right eye no high risk features, no fluid, no halo of atrophy, no lipofuscin, no thickness, regular pigmentation

## 2020-05-26 ENCOUNTER — Emergency Department (HOSPITAL_COMMUNITY): Payer: PPO

## 2020-05-26 ENCOUNTER — Encounter (HOSPITAL_COMMUNITY): Payer: Self-pay

## 2020-05-26 ENCOUNTER — Observation Stay (HOSPITAL_COMMUNITY): Payer: PPO

## 2020-05-26 ENCOUNTER — Other Ambulatory Visit: Payer: Self-pay

## 2020-05-26 ENCOUNTER — Inpatient Hospital Stay (HOSPITAL_COMMUNITY)
Admission: EM | Admit: 2020-05-26 | Discharge: 2020-05-31 | DRG: 041 | Disposition: A | Discharge status: Rehabilitation Facility | Payer: PPO | Attending: Internal Medicine | Admitting: Internal Medicine

## 2020-05-26 DIAGNOSIS — S62115A Nondisplaced fracture of triquetrum [cuneiform] bone, left wrist, initial encounter for closed fracture: Secondary | ICD-10-CM | POA: Diagnosis present

## 2020-05-26 DIAGNOSIS — R29898 Other symptoms and signs involving the musculoskeletal system: Secondary | ICD-10-CM | POA: Diagnosis not present

## 2020-05-26 DIAGNOSIS — M25512 Pain in left shoulder: Secondary | ICD-10-CM

## 2020-05-26 DIAGNOSIS — G8929 Other chronic pain: Secondary | ICD-10-CM | POA: Diagnosis not present

## 2020-05-26 DIAGNOSIS — S6292XA Unspecified fracture of left wrist and hand, initial encounter for closed fracture: Secondary | ICD-10-CM | POA: Diagnosis not present

## 2020-05-26 DIAGNOSIS — Z885 Allergy status to narcotic agent status: Secondary | ICD-10-CM

## 2020-05-26 DIAGNOSIS — I63421 Cerebral infarction due to embolism of right anterior cerebral artery: Secondary | ICD-10-CM | POA: Diagnosis present

## 2020-05-26 DIAGNOSIS — M25532 Pain in left wrist: Secondary | ICD-10-CM | POA: Diagnosis not present

## 2020-05-26 DIAGNOSIS — M19011 Primary osteoarthritis, right shoulder: Secondary | ICD-10-CM | POA: Diagnosis present

## 2020-05-26 DIAGNOSIS — R200 Anesthesia of skin: Secondary | ICD-10-CM

## 2020-05-26 DIAGNOSIS — G8194 Hemiplegia, unspecified affecting left nondominant side: Secondary | ICD-10-CM | POA: Diagnosis present

## 2020-05-26 DIAGNOSIS — D329 Benign neoplasm of meninges, unspecified: Secondary | ICD-10-CM | POA: Diagnosis present

## 2020-05-26 DIAGNOSIS — W19XXXA Unspecified fall, initial encounter: Secondary | ICD-10-CM | POA: Diagnosis present

## 2020-05-26 DIAGNOSIS — E7800 Pure hypercholesterolemia, unspecified: Secondary | ICD-10-CM | POA: Diagnosis not present

## 2020-05-26 DIAGNOSIS — I6521 Occlusion and stenosis of right carotid artery: Secondary | ICD-10-CM | POA: Diagnosis not present

## 2020-05-26 DIAGNOSIS — I63031 Cerebral infarction due to thrombosis of right carotid artery: Secondary | ICD-10-CM | POA: Diagnosis not present

## 2020-05-26 DIAGNOSIS — S5292XA Unspecified fracture of left forearm, initial encounter for closed fracture: Secondary | ICD-10-CM

## 2020-05-26 DIAGNOSIS — Z792 Long term (current) use of antibiotics: Secondary | ICD-10-CM

## 2020-05-26 DIAGNOSIS — K5900 Constipation, unspecified: Secondary | ICD-10-CM | POA: Diagnosis not present

## 2020-05-26 DIAGNOSIS — K5909 Other constipation: Secondary | ICD-10-CM | POA: Diagnosis not present

## 2020-05-26 DIAGNOSIS — Z7982 Long term (current) use of aspirin: Secondary | ICD-10-CM | POA: Diagnosis not present

## 2020-05-26 DIAGNOSIS — M21379 Foot drop, unspecified foot: Secondary | ICD-10-CM | POA: Diagnosis present

## 2020-05-26 DIAGNOSIS — S62102A Fracture of unspecified carpal bone, left wrist, initial encounter for closed fracture: Secondary | ICD-10-CM | POA: Diagnosis not present

## 2020-05-26 DIAGNOSIS — S62112D Displaced fracture of triquetrum [cuneiform] bone, left wrist, subsequent encounter for fracture with routine healing: Secondary | ICD-10-CM | POA: Diagnosis not present

## 2020-05-26 DIAGNOSIS — M67919 Unspecified disorder of synovium and tendon, unspecified shoulder: Secondary | ICD-10-CM

## 2020-05-26 DIAGNOSIS — M2170 Unequal limb length (acquired), unspecified site: Secondary | ICD-10-CM | POA: Diagnosis not present

## 2020-05-26 DIAGNOSIS — Z8673 Personal history of transient ischemic attack (TIA), and cerebral infarction without residual deficits: Secondary | ICD-10-CM

## 2020-05-26 DIAGNOSIS — Z8249 Family history of ischemic heart disease and other diseases of the circulatory system: Secondary | ICD-10-CM | POA: Diagnosis not present

## 2020-05-26 DIAGNOSIS — G629 Polyneuropathy, unspecified: Secondary | ICD-10-CM | POA: Diagnosis not present

## 2020-05-26 DIAGNOSIS — Z800 Family history of malignant neoplasm of digestive organs: Secondary | ICD-10-CM

## 2020-05-26 DIAGNOSIS — I6782 Cerebral ischemia: Secondary | ICD-10-CM | POA: Diagnosis present

## 2020-05-26 DIAGNOSIS — R9431 Abnormal electrocardiogram [ECG] [EKG]: Secondary | ICD-10-CM | POA: Diagnosis not present

## 2020-05-26 DIAGNOSIS — Z79899 Other long term (current) drug therapy: Secondary | ICD-10-CM | POA: Diagnosis not present

## 2020-05-26 DIAGNOSIS — I69354 Hemiplegia and hemiparesis following cerebral infarction affecting left non-dominant side: Secondary | ICD-10-CM | POA: Diagnosis not present

## 2020-05-26 DIAGNOSIS — Z20822 Contact with and (suspected) exposure to covid-19: Secondary | ICD-10-CM | POA: Diagnosis present

## 2020-05-26 DIAGNOSIS — I639 Cerebral infarction, unspecified: Secondary | ICD-10-CM

## 2020-05-26 DIAGNOSIS — R29701 NIHSS score 1: Secondary | ICD-10-CM | POA: Diagnosis present

## 2020-05-26 DIAGNOSIS — G4700 Insomnia, unspecified: Secondary | ICD-10-CM | POA: Diagnosis not present

## 2020-05-26 DIAGNOSIS — E785 Hyperlipidemia, unspecified: Secondary | ICD-10-CM | POA: Diagnosis present

## 2020-05-26 DIAGNOSIS — I10 Essential (primary) hypertension: Secondary | ICD-10-CM | POA: Diagnosis not present

## 2020-05-26 DIAGNOSIS — J029 Acute pharyngitis, unspecified: Secondary | ICD-10-CM | POA: Diagnosis not present

## 2020-05-26 DIAGNOSIS — G894 Chronic pain syndrome: Secondary | ICD-10-CM | POA: Diagnosis present

## 2020-05-26 DIAGNOSIS — Z905 Acquired absence of kidney: Secondary | ICD-10-CM

## 2020-05-26 DIAGNOSIS — K219 Gastro-esophageal reflux disease without esophagitis: Secondary | ICD-10-CM

## 2020-05-26 DIAGNOSIS — R600 Localized edema: Secondary | ICD-10-CM | POA: Diagnosis not present

## 2020-05-26 DIAGNOSIS — I6350 Cerebral infarction due to unspecified occlusion or stenosis of unspecified cerebral artery: Secondary | ICD-10-CM | POA: Diagnosis not present

## 2020-05-26 DIAGNOSIS — I6300 Cerebral infarction due to thrombosis of unspecified precerebral artery: Secondary | ICD-10-CM | POA: Diagnosis not present

## 2020-05-26 DIAGNOSIS — S62113A Displaced fracture of triquetrum [cuneiform] bone, unspecified wrist, initial encounter for closed fracture: Secondary | ICD-10-CM

## 2020-05-26 DIAGNOSIS — Z90710 Acquired absence of both cervix and uterus: Secondary | ICD-10-CM | POA: Diagnosis not present

## 2020-05-26 DIAGNOSIS — M542 Cervicalgia: Secondary | ICD-10-CM | POA: Diagnosis not present

## 2020-05-26 DIAGNOSIS — I6389 Other cerebral infarction: Secondary | ICD-10-CM | POA: Diagnosis not present

## 2020-05-26 DIAGNOSIS — Z85528 Personal history of other malignant neoplasm of kidney: Secondary | ICD-10-CM | POA: Diagnosis not present

## 2020-05-26 DIAGNOSIS — Z7902 Long term (current) use of antithrombotics/antiplatelets: Secondary | ICD-10-CM | POA: Diagnosis not present

## 2020-05-26 DIAGNOSIS — M25511 Pain in right shoulder: Secondary | ICD-10-CM | POA: Diagnosis not present

## 2020-05-26 DIAGNOSIS — D320 Benign neoplasm of cerebral meninges: Secondary | ICD-10-CM | POA: Diagnosis not present

## 2020-05-26 LAB — PROTIME-INR
INR: 0.9 (ref 0.8–1.2)
Prothrombin Time: 11.5 seconds (ref 11.4–15.2)

## 2020-05-26 LAB — RESP PANEL BY RT-PCR (FLU A&B, COVID) ARPGX2
Influenza A by PCR: NEGATIVE
Influenza B by PCR: NEGATIVE
SARS Coronavirus 2 by RT PCR: NEGATIVE

## 2020-05-26 LAB — COMPREHENSIVE METABOLIC PANEL
ALT: 20 U/L (ref 0–44)
AST: 26 U/L (ref 15–41)
Albumin: 3.7 g/dL (ref 3.5–5.0)
Alkaline Phosphatase: 69 U/L (ref 38–126)
Anion gap: 10 (ref 5–15)
BUN: 9 mg/dL (ref 8–23)
CO2: 26 mmol/L (ref 22–32)
Calcium: 9.3 mg/dL (ref 8.9–10.3)
Chloride: 102 mmol/L (ref 98–111)
Creatinine, Ser: 0.82 mg/dL (ref 0.44–1.00)
GFR, Estimated: >60 mL/min (ref >60)
Glucose, Bld: 102 mg/dL — ABNORMAL HIGH (ref 70–99)
Potassium: 4.3 mmol/L (ref 3.5–5.1)
Sodium: 138 mmol/L (ref 135–145)
Total Bilirubin: 0.6 mg/dL (ref 0.3–1.2)
Total Protein: 6.8 g/dL (ref 6.5–8.1)

## 2020-05-26 LAB — DIFFERENTIAL
Abs Immature Granulocytes: 0.04 10*3/uL (ref 0.00–0.07)
Basophils Absolute: 0 10*3/uL (ref 0.0–0.1)
Basophils Relative: 0 %
Eosinophils Absolute: 0 10*3/uL (ref 0.0–0.5)
Eosinophils Relative: 0 %
Immature Granulocytes: 0 %
Lymphocytes Relative: 13 %
Lymphs Abs: 1.2 10*3/uL (ref 0.7–4.0)
Monocytes Absolute: 0.9 10*3/uL (ref 0.1–1.0)
Monocytes Relative: 9 %
Neutro Abs: 7 10*3/uL (ref 1.7–7.7)
Neutrophils Relative %: 78 %

## 2020-05-26 LAB — CBC
HCT: 43.6 % (ref 36.0–46.0)
Hemoglobin: 13.7 g/dL (ref 12.0–15.0)
MCH: 29.5 pg (ref 26.0–34.0)
MCHC: 31.4 g/dL (ref 30.0–36.0)
MCV: 94 fL (ref 80.0–100.0)
Platelets: 339 10*3/uL (ref 150–400)
RBC: 4.64 MIL/uL (ref 3.87–5.11)
RDW: 12.6 % (ref 11.5–15.5)
WBC: 9.2 10*3/uL (ref 4.0–10.5)
nRBC: 0 % (ref 0.0–0.2)

## 2020-05-26 LAB — LIPID PANEL
Cholesterol: 244 mg/dL — ABNORMAL HIGH (ref 0–200)
HDL: 79 mg/dL (ref >40)
LDL Cholesterol: 143 mg/dL — ABNORMAL HIGH (ref 0–99)
Total CHOL/HDL Ratio: 3.1 RATIO
Triglycerides: 108 mg/dL (ref <150)
VLDL: 22 mg/dL (ref 0–40)

## 2020-05-26 LAB — APTT: aPTT: 30 seconds (ref 24–36)

## 2020-05-26 MED ORDER — PANTOPRAZOLE SODIUM 40 MG PO TBEC
40.0000 mg | DELAYED_RELEASE_TABLET | Freq: Every day | ORAL | Status: DC
  Administered 2020-05-26 – 2020-05-31 (×6): 40 mg via ORAL
  Filled 2020-05-26 (×6): qty 1
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
## 2020-05-26 NOTE — ED Notes (Signed)
RN attempted report 

## 2020-05-26 NOTE — H&P (Signed)
History and Physical  [name] HGD:[id number] DOB: [birth year] DOA: 05/26/2020  Referring physician: Noemi Chapel MD PCP: Hulan Fess, MD  Patient coming from: Home  Chief Complaint: Left leg numbness  HPI: [name] is a 79 y.o. female with medical history significant hip fracture at age of 87, chronic pain syndrome, kidney cancer s/p right nephrectomy (per patient) who presents to the emergency department via EMS due to 2 to 3-day onset of left leg numbness (from knee to foot).  Patient sustained a fall yesterday which resulted in a left arm injury that required a volar splint.  At baseline, patient has leg length difference with left leg being shorter than right and she uses an orthotic heel lifts shoe. This morning, patient states that the leg numbness was worse to the extent that she was unable to use husband's walker to ambulate, so EMS was activated.  Patient was taken to the ED for further evaluation, patient was noted dragging left foot when walking by ED medical record. She denies facial droop, slurred speech, headache, chest pain or shortness of breath  ED Course:  In the emergency department, BP on arrival was 170/76, other vital signs are within normal range.  Work-up in the ED showed normal CBC and BMP.  CT of head without contrast showed no acute intracranial abnormality.  Left wrist x-ray showed a small fragment dorsal to the mid carpus single lateral view, suspicious for triquetral avulsion fracture. Orthopedic surgeon was consulted, and volar splint was placed on left arm Neurology was consulted and recommended further stroke work-up Hospitalist was asked to admit patient for further evaluation and management.  Review of Systems: Constitutional: Negative for chills and fever.  HENT: Negative for ear pain and sore throat.   Eyes: Negative for pain and visual disturbance.  Respiratory: Negative for cough, chest tightness and shortness of breath.   Cardiovascular:  Negative for chest pain and palpitations.  Gastrointestinal: Negative for abdominal pain and vomiting.  Endocrine: Negative for polyphagia and polyuria.  Genitourinary: Negative for decreased urine volume, dysuria, enuresis Musculoskeletal: Positive for fall yesterday (12/12).    Skin: Negative for color change and rash.  Allergic/Immunologic: Negative for immunocompromised state.  Neurological: Positive for left leg numbness.  Negative for tremors, syncope, speech difficulty Hematological: Does not bruise/bleed easily.  All other systems reviewed and are negative  Past Medical History:  Diagnosis Date  . Blood transfusion without reported diagnosis    with hip fracture-79 years old  . Cancer [facility]) 1983   Kidney Cancer  . Diverticulosis   . GERD (gastroesophageal reflux disease)   . Personal history of kidney cancer    Past Surgical History:  Procedure Laterality Date  . ABDOMINAL HYSTERECTOMY    . COLON SURGERY     Severe sigmoid diverticulitis  . COLONOSCOPY    . FOOT SURGERY    . HIP SURGERY    . Kidney cancer     Kidney removed  . POLYPECTOMY      Social History:  reports that she has never smoked. She has never used smokeless tobacco. She reports that she does not drink alcohol and does not use drugs.   Allergies  Allergen Reactions  . Codeine Other (See Comments)    Pt unsure if really allergic to this    Family History  Problem Relation Age of Onset  . Colon cancer Mother   . Coronary artery disease Father   . Breast cancer Neg Hx      Prior  to Admission medications   Medication Sig Start Date End Date Taking? Authorizing Provider  aspirin 81 MG tablet Take 81 mg by mouth daily.      [provider]  CALCIUM & MAGNESIUM CARBONATES PO Take by mouth. 2 daily      [provider]  cephALEXin (KEFLEX) 500 MG capsule Take 1 capsule (500 mg total) by mouth 2 (two) times daily. 05/26/16   Wallene Huh, DPM  doxycycline (VIBRA-TABS) 100 MG  tablet  10/24/18   [provider]  erythromycin ophthalmic ointment  08/03/18   [provider]  estradiol (ESTRACE) 0.5 MG tablet Take 0.5 mg by mouth daily.    [provider]  gabapentin (NEURONTIN) 300 MG capsule Take 1 capsule (300 mg total) by mouth at bedtime. 08/29/18   Landis Martins, DPM  Multiple Vitamins-Minerals (MULTIVITAMIN WITH MINERALS) tablet Take 1 tablet by mouth daily.      [provider]  neomycin-polymyxin b-dexamethasone (MAXITROL) 3.5-10000-0.1 OINT  10/26/18   [provider]  omeprazole (PRILOSEC) 40 MG capsule  10/04/18   [provider]  predniSONE (DELTASONE) 5 MG tablet prednisone 5 mg tablets in a dose pack  Take 1 dose pk by oral route for 6 days.    [provider]  ranitidine (ZANTAC) 150 MG capsule Take 150 mg by mouth 1 day or 1 dose.    [provider]  traMADol (ULTRAM) 50 MG tablet tramadol 50 mg tablet    [provider]  vitamin C (ASCORBIC ACID) 500 MG tablet Take 500 mg by mouth daily.    [provider]    Physical Exam: BP (!) 164/67 (BP Location: Right Arm)   Pulse 64   Temp 98.1 F (36.7 C) (Oral)   Resp 18   SpO2 98%   . General: 78 y.o. year-old female well developed well nourished in no acute distress.  Alert and oriented x3. Marland Kitchen HEENT: NCAT, EOMI . Neck: Supple, trachea medial . Cardiovascular: Regular rate and rhythm with no rubs or gallops.  No thyromegaly or JVD noted. 2/4 pulses in all 4 extremities. Marland Kitchen Respiratory: Clear to auscultation with no wheezes or rales. Good inspiratory effort. . Abdomen: Soft nontender nondistended with normal bowel sounds x4 quadrants. . Muskuloskeletal: No cyanosis, clubbing or edema noted bilaterally . Neuro: Progressive decreased sensation in LLE (from knee to foot).  CN II-XII intact. . Skin: No ulcerative lesions noted or rashes . Psychiatry: Judgement and insight appear normal. Mood is appropriate for condition  and setting          Labs on Admission:  Basic Metabolic Panel: Recent Labs  Lab 05/26/20 1639  NA 138  K 4.3  CL 102  CO2 26  GLUCOSE 102*  BUN 9  CREATININE 0.82  CALCIUM 9.3   Liver Function Tests: Recent Labs  Lab 05/26/20 1639  AST 26  ALT 20  ALKPHOS 69  BILITOT 0.6  PROT 6.8  ALBUMIN 3.7   No results for input(s): LIPASE, AMYLASE in the last 168 hours. No results for input(s): AMMONIA in the last 168 hours. CBC: Recent Labs  Lab 05/26/20 1639  WBC 9.2  NEUTROABS 7.0  HGB 13.7  HCT 43.6  MCV 94.0  PLT 339   Cardiac Enzymes: No results for input(s): CKTOTAL, CKMB, CKMBINDEX, TROPONINI in the last 168 hours.  BNP (last 3 results) No results for input(s): BNP in the last 8760 hours.  ProBNP (last 3 results) No results for input(s): PROBNP in  the last 8760 hours.  CBG: No results for input(s): GLUCAP in the last 168 hours.  Radiological Exams on Admission: DG Wrist Complete Left  Result Date: 05/26/2020 CLINICAL DATA:  Fall, pain EXAM: LEFT WRIST - COMPLETE 3+ VIEW COMPARISON:  None. FINDINGS: There is a small fragment dorsal to the mid carpus seen on lateral view. No other fracture or dislocation. Thumb basal arthrosis. Dorsal soft tissue edema. IMPRESSION: There is a small fragment dorsal to the mid carpus seen on lateral view, suspicious for triquetral avulsion fracture. No other fracture or dislocation. Dorsal soft tissue edema. Electronically Signed   By: Eddie Candle M.D.   On: 05/26/2020 15:47   CT HEAD WO CONTRAST  Result Date: 05/26/2020 CLINICAL DATA:  Left leg numbness EXAM: CT HEAD WITHOUT CONTRAST TECHNIQUE: Contiguous axial images were obtained from the base of the skull through the vertex without intravenous contrast. COMPARISON:  None. FINDINGS: Brain: No acute territorial infarction or hemorrhage is visualized. 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 Chronic infarct in the right cerebellum. Nonenlarged ventricles. Mild white matter hypodensity likely chronic small vessel ischemic change Vascular: No hyperdense vessels.  Carotid vascular calcification Skull: Normal. Negative for fracture Sinuses/Orbits: No acute finding. Other: None IMPRESSION: 1. No CT evidence for acute intracranial abnormality. Mild chronic small vessel ischemic change of the white matter. 2. 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 3. Chronic right cerebellar infarct. Electronically Signed   By: Donavan Foil M.D.   On: 05/26/2020 16:47    EKG: I independently viewed the EKG done and my findings are as followed: Normal sinus rhythm with sinus arrhythmia at a rate of 65 bpm  Assessment/Plan Present on Admission: . Acute CVA (cerebrovascular accident) [facility])  Active Problems:   Acute CVA (cerebrovascular accident) ([place])   Left leg numbness   GERD (gastroesophageal reflux disease)   Left forearm fracture   Left leg weakness rule out acute CVA CT of head shows no acute intracranial abnormality Patient will be admitted to telemetry unit  Echocardiogram in the morning MR angio of brain and neck will be done Continue aspirin  Continue fall precautions and neuro checks Lipid panel and hemoglobin A1c will be checked Continue PT/ST/OT eval and treat Bedside swallow eval by nursing prior to diet Neurology consult appreciated  Left triquetral avulsion fracture of left forearm Volar splint was placed Continue PT/OT eval and treat  GERD Continue Protonix   DVT prophylaxis: SCDs  Code Status: Full code  Family Communication: None at bedside  Disposition Plan:  Patient is from:                        home Anticipated DC to:                   home Anticipated DC date:                1 day Anticipated DC barriers:            Patient is unstable to be discharged at this time due to left leg numbness suspected to be due to possible acute CVA pending further stroke work-up  Consults called: Neurology  Admission status: Observation  Bernadette Hoit MD [facility]  05/26/2020, 9:31 PM

## 2020-05-26 NOTE — Progress Notes (Signed)
Orthopedic Tech Progress Note Patient Details:  [name] 1940/09/10 [id number] Asked MD which splint and he said just a VOLAR. Added a little padding for patient Ortho Devices Type of Ortho Device: Volar splint Ortho Device/Splint Location: LUE Ortho Device/Splint Interventions: Ordered,Application   Post Interventions Patient Tolerated: Well Instructions Provided: Care of device   Janit Pagan 05/26/2020, 5:16 PM

## 2020-05-26 NOTE — ED Triage Notes (Signed)
Pt BIB [facility] d/t Left leg numbness from her knee to her foot for the las 2-3 days. Pt fell d/t to numbness & now has a Left elbow injury. EMS reports good pulses, she can wiggle her toes on command, she does drag her foot when walking & she is A/Ox4 upon arrival.

## 2020-05-27 ENCOUNTER — Observation Stay (HOSPITAL_BASED_OUTPATIENT_CLINIC_OR_DEPARTMENT_OTHER): Payer: PPO

## 2020-05-27 ENCOUNTER — Observation Stay (HOSPITAL_COMMUNITY): Payer: PPO

## 2020-05-27 DIAGNOSIS — R29898 Other symptoms and signs involving the musculoskeletal system: Secondary | ICD-10-CM | POA: Diagnosis not present

## 2020-05-27 DIAGNOSIS — D329 Benign neoplasm of meninges, unspecified: Secondary | ICD-10-CM | POA: Diagnosis present

## 2020-05-27 DIAGNOSIS — Z85528 Personal history of other malignant neoplasm of kidney: Secondary | ICD-10-CM | POA: Diagnosis not present

## 2020-05-27 DIAGNOSIS — S62113A Displaced fracture of triquetrum [cuneiform] bone, unspecified wrist, initial encounter for closed fracture: Secondary | ICD-10-CM

## 2020-05-27 DIAGNOSIS — I6782 Cerebral ischemia: Secondary | ICD-10-CM | POA: Diagnosis present

## 2020-05-27 DIAGNOSIS — Z8673 Personal history of transient ischemic attack (TIA), and cerebral infarction without residual deficits: Secondary | ICD-10-CM | POA: Diagnosis not present

## 2020-05-27 DIAGNOSIS — E785 Hyperlipidemia, unspecified: Secondary | ICD-10-CM | POA: Diagnosis present

## 2020-05-27 DIAGNOSIS — Z792 Long term (current) use of antibiotics: Secondary | ICD-10-CM | POA: Diagnosis not present

## 2020-05-27 DIAGNOSIS — I639 Cerebral infarction, unspecified: Secondary | ICD-10-CM | POA: Diagnosis not present

## 2020-05-27 DIAGNOSIS — M19011 Primary osteoarthritis, right shoulder: Secondary | ICD-10-CM | POA: Diagnosis present

## 2020-05-27 DIAGNOSIS — Z20822 Contact with and (suspected) exposure to covid-19: Secondary | ICD-10-CM | POA: Diagnosis present

## 2020-05-27 DIAGNOSIS — I6521 Occlusion and stenosis of right carotid artery: Secondary | ICD-10-CM | POA: Diagnosis not present

## 2020-05-27 DIAGNOSIS — W19XXXA Unspecified fall, initial encounter: Secondary | ICD-10-CM | POA: Diagnosis present

## 2020-05-27 DIAGNOSIS — Z885 Allergy status to narcotic agent status: Secondary | ICD-10-CM | POA: Diagnosis not present

## 2020-05-27 DIAGNOSIS — I63421 Cerebral infarction due to embolism of right anterior cerebral artery: Secondary | ICD-10-CM | POA: Diagnosis present

## 2020-05-27 DIAGNOSIS — Z8249 Family history of ischemic heart disease and other diseases of the circulatory system: Secondary | ICD-10-CM | POA: Diagnosis not present

## 2020-05-27 DIAGNOSIS — S62112D Displaced fracture of triquetrum [cuneiform] bone, left wrist, subsequent encounter for fracture with routine healing: Secondary | ICD-10-CM | POA: Diagnosis not present

## 2020-05-27 DIAGNOSIS — G894 Chronic pain syndrome: Secondary | ICD-10-CM | POA: Diagnosis present

## 2020-05-27 DIAGNOSIS — I6300 Cerebral infarction due to thrombosis of unspecified precerebral artery: Secondary | ICD-10-CM | POA: Diagnosis not present

## 2020-05-27 DIAGNOSIS — D320 Benign neoplasm of cerebral meninges: Secondary | ICD-10-CM | POA: Diagnosis not present

## 2020-05-27 DIAGNOSIS — G8194 Hemiplegia, unspecified affecting left nondominant side: Secondary | ICD-10-CM | POA: Diagnosis present

## 2020-05-27 DIAGNOSIS — K219 Gastro-esophageal reflux disease without esophagitis: Secondary | ICD-10-CM | POA: Diagnosis present

## 2020-05-27 DIAGNOSIS — M21379 Foot drop, unspecified foot: Secondary | ICD-10-CM | POA: Diagnosis present

## 2020-05-27 DIAGNOSIS — S62115A Nondisplaced fracture of triquetrum [cuneiform] bone, left wrist, initial encounter for closed fracture: Secondary | ICD-10-CM | POA: Diagnosis present

## 2020-05-27 DIAGNOSIS — I6350 Cerebral infarction due to unspecified occlusion or stenosis of unspecified cerebral artery: Secondary | ICD-10-CM | POA: Diagnosis not present

## 2020-05-27 DIAGNOSIS — Z7982 Long term (current) use of aspirin: Secondary | ICD-10-CM | POA: Diagnosis not present

## 2020-05-27 DIAGNOSIS — Z79899 Other long term (current) drug therapy: Secondary | ICD-10-CM | POA: Diagnosis not present

## 2020-05-27 DIAGNOSIS — I63031 Cerebral infarction due to thrombosis of right carotid artery: Secondary | ICD-10-CM | POA: Diagnosis not present

## 2020-05-27 DIAGNOSIS — S62102A Fracture of unspecified carpal bone, left wrist, initial encounter for closed fracture: Secondary | ICD-10-CM | POA: Diagnosis not present

## 2020-05-27 DIAGNOSIS — I6389 Other cerebral infarction: Secondary | ICD-10-CM | POA: Diagnosis not present

## 2020-05-27 DIAGNOSIS — E7800 Pure hypercholesterolemia, unspecified: Secondary | ICD-10-CM | POA: Diagnosis not present

## 2020-05-27 DIAGNOSIS — R29701 NIHSS score 1: Secondary | ICD-10-CM | POA: Diagnosis present

## 2020-05-27 DIAGNOSIS — Z800 Family history of malignant neoplasm of digestive organs: Secondary | ICD-10-CM | POA: Diagnosis not present

## 2020-05-27 DIAGNOSIS — Z905 Acquired absence of kidney: Secondary | ICD-10-CM | POA: Diagnosis not present

## 2020-05-27 LAB — COMPREHENSIVE METABOLIC PANEL
ALT: 17 U/L (ref 0–44)
AST: 19 U/L (ref 15–41)
Albumin: 3.1 g/dL — ABNORMAL LOW (ref 3.5–5.0)
Alkaline Phosphatase: 59 U/L (ref 38–126)
Anion gap: 8 (ref 5–15)
BUN: 9 mg/dL (ref 8–23)
CO2: 26 mmol/L (ref 22–32)
Calcium: 9 mg/dL (ref 8.9–10.3)
Chloride: 104 mmol/L (ref 98–111)
Creatinine, Ser: 0.86 mg/dL (ref 0.44–1.00)
GFR, Estimated: >60 mL/min (ref >60)
Glucose, Bld: 93 mg/dL (ref 70–99)
Potassium: 3.7 mmol/L (ref 3.5–5.1)
Sodium: 138 mmol/L (ref 135–145)
Total Bilirubin: 0.6 mg/dL (ref 0.3–1.2)
Total Protein: 5.7 g/dL — ABNORMAL LOW (ref 6.5–8.1)

## 2020-05-27 LAB — CBC
HCT: 38.3 % (ref 36.0–46.0)
Hemoglobin: 12.5 g/dL (ref 12.0–15.0)
MCH: 30.3 pg (ref 26.0–34.0)
MCHC: 32.6 g/dL (ref 30.0–36.0)
MCV: 92.7 fL (ref 80.0–100.0)
Platelets: 287 10*3/uL (ref 150–400)
RBC: 4.13 MIL/uL (ref 3.87–5.11)
RDW: 12.6 % (ref 11.5–15.5)
WBC: 5.6 10*3/uL (ref 4.0–10.5)
nRBC: 0 % (ref 0.0–0.2)

## 2020-05-27 LAB — APTT: aPTT: 32 seconds (ref 24–36)

## 2020-05-27 LAB — ECHOCARDIOGRAM COMPLETE
Area-P 1/2: 2.45 cm2
S' Lateral: 2.1 cm

## 2020-05-27 LAB — PHOSPHORUS: Phosphorus: 3.2 mg/dL (ref 2.5–4.6)

## 2020-05-27 LAB — MAGNESIUM: Magnesium: 2.1 mg/dL (ref 1.7–2.4)

## 2020-05-27 LAB — PROTIME-INR
INR: 0.9 (ref 0.8–1.2)
Prothrombin Time: 12 seconds (ref 11.4–15.2)

## 2020-05-27 LAB — HEMOGLOBIN A1C
Hgb A1c MFr Bld: 5.8 % — ABNORMAL HIGH (ref 4.8–5.6)
Mean Plasma Glucose: 119.76 mg/dL

## 2020-05-27 MED ORDER — ATORVASTATIN CALCIUM 40 MG PO TABS
40.0000 mg | ORAL_TABLET | Freq: Every day | ORAL | Status: DC
  Administered 2020-05-27 – 2020-05-31 (×5): 40 mg via ORAL
  Filled 2020-05-27 (×5): qty 1

## 2020-05-27 MED ORDER — ASPIRIN 300 MG RE SUPP
300.0000 mg | Freq: Every day | RECTAL | Status: DC
  Filled 2020-05-27: qty 1

## 2020-05-27 MED ORDER — STROKE: EARLY STAGES OF RECOVERY BOOK
Freq: Once | Status: AC
  Filled 2020-05-27: qty 1

## 2020-05-27 MED ORDER — ASPIRIN EC 81 MG PO TBEC
81.0000 mg | DELAYED_RELEASE_TABLET | Freq: Every day | ORAL | Status: DC
  Administered 2020-05-28 – 2020-05-31 (×4): 81 mg via ORAL
  Filled 2020-05-27 (×5): qty 1

## 2020-05-27 MED ORDER — ENOXAPARIN SODIUM 40 MG/0.4ML ~~LOC~~ SOLN
40.0000 mg | SUBCUTANEOUS | Status: DC
  Administered 2020-05-27 – 2020-05-30 (×4): 40 mg via SUBCUTANEOUS
  Filled 2020-05-27 (×4): qty 0.4

## 2020-05-27 MED ORDER — GADOBUTROL 1 MMOL/ML IV SOLN
7.0000 mL | Freq: Once | INTRAVENOUS | Status: AC | PRN
  Administered 2020-05-27: 7 mL via INTRAVENOUS

## 2020-05-27 MED ORDER — CLOPIDOGREL BISULFATE 75 MG PO TABS
75.0000 mg | ORAL_TABLET | Freq: Every day | ORAL | Status: DC
  Administered 2020-05-27 – 2020-05-31 (×5): 75 mg via ORAL
  Filled 2020-05-27 (×5): qty 1

## 2020-05-27 MED ORDER — ACETAMINOPHEN 325 MG PO TABS
650.0000 mg | ORAL_TABLET | Freq: Four times a day (QID) | ORAL | Status: DC | PRN
  Administered 2020-05-27 – 2020-05-31 (×7): 650 mg via ORAL
  Filled 2020-05-27 (×7): qty 2

## 2020-05-27 NOTE — Progress Notes (Signed)
PT Cancellation Note  Patient Details Name: TASNEEM CORMIER MRN: [id number] DOB: [birth year]   Cancelled Treatment:    Reason Eval/Treat Not Completed: Patient at procedure or test/unavailable Pt currently at [place]. Will follow up as schedule allows.   Lou Miner, DPT  Acute Rehabilitation Services  Pager: [phone] Office: [phone]    [name] 05/27/2020, 9:45 AM

## 2020-05-27 NOTE — Progress Notes (Signed)
STROKE TEAM PROGRESS NOTE   INTERVAL HISTORY Her daughter is on the phone. Pt still has significant left LE weakness, difficulty to pick up out of bed. She denies heart palpitation, but MRI showed right ACA stroke, embolic pattern.  DVT negative and 2D echo unremarkable.  Discussed with patient and her daughter regarding loop recorder, they would like to proceed.    OBJECTIVE Vitals:   05/27/20 0845 05/27/20 1115 05/27/20 1215 05/27/20 1330  BP: (!) 155/75 (!) 164/72 (!) 134/99 (!) 167/68  Pulse: 70 64 66 70  Resp: 17 (!) 22 14 17   Temp:    98.3 F (36.8 C)  TempSrc:    Oral  SpO2: 97% 99% 99% 97%    CBC:  Recent Labs  Lab 05/26/20 1639 05/27/20 0240  WBC 9.2 5.6  NEUTROABS 7.0  --   HGB 13.7 12.5  HCT 43.6 38.3  MCV 94.0 92.7  PLT 339 456    Basic Metabolic Panel:  Recent Labs  Lab 05/26/20 1639 05/27/20 0240  NA 138 138  K 4.3 3.7  CL 102 104  CO2 26 26  GLUCOSE 102* 93  BUN 9 9  CREATININE 0.82 0.86  CALCIUM 9.3 9.0  MG  --  2.1  PHOS  --  3.2    Lipid Panel:     Component Value Date/Time   CHOL 244 (H) 05/26/2020 2152   TRIG 108 05/26/2020 2152   HDL 79 05/26/2020 2152   CHOLHDL 3.1 05/26/2020 2152   VLDL 22 05/26/2020 2152   LDLCALC 143 (H) 05/26/2020 2152   HgbA1c:  Lab Results  Component Value Date   HGBA1C 5.8 (H) 05/27/2020   Urine Drug Screen: No results found for: LABOPIA, COCAINSCRNUR, LABBENZ, AMPHETMU, THCU, LABBARB  Alcohol Level No results found for: [facility]  IMAGING  DG Wrist Complete Left 05/26/2020 IMPRESSION:  There is a small fragment dorsal to the mid carpus seen on lateral view, suspicious for triquetral avulsion fracture. No other fracture or dislocation. Dorsal soft tissue edema.   CT HEAD WO CONTRAST 05/26/2020 IMPRESSION:  1. No CT evidence for acute intracranial abnormality. Mild chronic small vessel ischemic change of the white matter.  2. 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  3. Chronic right cerebellar infarct.   MR ANGIO HEAD WO CONTRAST 05/27/2020 IMPRESSION:  Right A2 ACA occlusion. Areas of infarction on prior study could reflect watershed ischemia. Moderate right supraclinoid ICA stenosis.   MR BRAIN W WO CONTRAST  MR ANGIO NECK W WO CONTRAST        05/27/2020 IMPRESSION:  1. Multifocal acute ischemia within the right hemisphere, predominantly within the anterior frontal lobe and medial parietal lobe. No hemorrhage or mass effect.  2. 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  3. Normal MRA of the neck.   ECHOCARDIOGRAM COMPLETE 05/27/2020 IMPRESSIONS   1. Left ventricular ejection fraction, by estimation, is 60 to 65%. The left ventricle has normal function. The left ventricle has no regional wall motion abnormalities. Left ventricular diastolic parameters are consistent with Grade I diastolic dysfunction (impaired relaxation).   2. Right ventricular systolic function is normal. The right ventricular size is normal. There is normal pulmonary artery systolic pressure.   3. The mitral valve is normal in structure. Trivial mitral valve regurgitation.   4. The aortic valve is tricuspid. There is mild calcification of the aortic valve. There is mild thickening  of the aortic valve. Aortic valve regurgitation is not visualized. Mild aortic valve sclerosis is present, with no evidence of aortic valve stenosis.   5. The inferior vena cava is normal in size with greater than 50% respiratory variability, suggesting right atrial pressure of 3 mmHg. Comparison(s): No prior Echocardiogram.  Conclusion(s)/Recommendation(s): No intracardiac source of embolism detected on this transthoracic study. A transesophageal echocardiogram is recommended to exclude cardiac source of embolism if clinically indicated.   VAS [place] LOWER EXTREMITY VENOUS (DVT) 05/27/2020 Summary:  BILATERAL:   No evidence of  deep vein thrombosis seen in the lower extremities, bilaterally. - No evidence of superficial venous thrombosis in the lower extremities, bilaterally. -No evidence of popliteal cyst, bilaterally.   Final    ECG - SR rate 65 BPM. (See cardiology reading for complete details)   PHYSICAL EXAM  Temp:  [98 F (36.7 C)-98.6 F (37 C)] 98.3 F (36.8 C) (12/14 1330) Pulse Rate:  [58-74] 70 (12/14 1330) Resp:  [10-22] 17 (12/14 1330) BP: (134-167)/(60-105) 167/68 (12/14 1330) SpO2:  [94 %-100 %] 97 % (12/14 1330)  General - Well nourished, well developed, in no apparent distress.  Ophthalmologic - fundi not visualized due to noncooperation.  Cardiovascular - Regular rhythm and rate.  Mental Status -  Level of arousal and orientation to time, place, and person were intact. Language including expression, naming, repetition, comprehension was assessed and found intact. Fund of Knowledge was assessed and was intact.  Cranial Nerves II - XII - II - Visual field intact OU. III, IV, VI - Extraocular movements intact. V - Facial sensation intact bilaterally. VII - Facial movement intact bilaterally. VIII - Hearing & vestibular intact bilaterally. X - Palate elevates symmetrically. XI - Chin turning & shoulder shrug intact bilaterally. XII - Tongue protrusion intact.  Motor Strength - The patient's strength was normal in both upper extremities and pronator drift was absent.  Right lower extremity 5/5, left lower extremity 2-/5 proximal and 2/5 distal.  Bulk was normal and fasciculations were absent.   Motor Tone - Muscle tone was assessed at the neck and appendages and was normal.  Reflexes - The patient's reflexes were symmetrical in all extremities and she had no pathological reflexes.  Sensory - Light touch, temperature/pinprick were assessed and were decreased light touch sensation at left lower extremity..    Coordination - The patient had normal movements in the hands and feet with  no ataxia or dysmetria.  Tremor was absent.  Gait and Station - deferred.   ASSESSMENT/PLAN [name] is a 79 y.o. female with history of kidney cancer (nephrectomy), previous stroke by imaging, diverticulosis, idiopathic peripheral neuropathy, and hip fracture at the age of 82 from [place] (left leg now shorter than right) presents to ED with several day hx of LLE numbness / weakness resulting in a fall with left wrist fracture. LLE weakness resolved in ED.  She did not receive IV t-PA due to resolution of deficits.  Stroke: Multifocal acute ischemia within the right ACA territory - embolic - source unclear.  CT head - No CT evidence for acute intracranial abnormality. Chronic right cerebellar infarct. Mild chronic small vessel ischemic change of the white matter. 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   MRI head -  Multifocal acute ischemia within the right hemisphere, predominantly within the anterior frontal lobe and medial parietal lobe. Right planum sphenoidale meningioma measuring  1.5 cm.   MRA head - Right A2 ACA occlusion. Areas of infarction on prior study could reflect watershed ischemia. Moderate right supraclinoid ICA stenosis.   MRA Neck - Normal MRA of the neck.   2D Echo - EF 60 - 65%. No cardiac source of emboli identified.   LE venous doppler neg for DVT  Recommend loop recorder to rule out afib  [employer] Virus 2 - negative  LDL - 143   HGBA1C - 5.8  VTE prophylaxis - SCDs  No antithrombotic prior to admission, now on aspirin 81 mg daily and clopidogrel 75 mg daily DAPT for 3 months and then ASA alone given right ACA large vessel occlusion.  Patient counseled to be compliant with her antithrombotic medications  Ongoing aggressive stroke risk factor management  Therapy recommendations:  pending  Disposition:  Pending  Hyperlipidemia  Home Lipid  lowering medication: none   LDL 143, goal < 70  Current lipid lowering medication: Lipitor 40 mg daily   Continue statin at discharge  left wrist fracture  X-ray a small fragment dorsal to the mid carpus seen on lateral view, suspicious for triquetral avulsion fracture  Management per primary team  Other Stroke Risk Factors  Advanced age  Hx stroke/TIA on imaging  Other Active Problems, Findings and Recommendations  Code status - Full code   Hospital day # 0  Rosalin Hawking, MD PhD Stroke Neurology 05/27/2020 5:15 PM    To contact Stroke Continuity provider, please refer to [email or web address] After hours, contact General Neurology

## 2020-05-27 NOTE — ED Notes (Signed)
Pt is alert to self and location but not year. Pt also states that on sensation left leg feels a little different. This is same leg she has complained of numbness and tingling earlier

## 2020-05-27 NOTE — Progress Notes (Signed)
PROGRESS NOTE  [name] QMV:[id number] DOB: [birth year] DOA: 05/26/2020 PCP: Hulan Fess, MD  Brief History   79 year old presented with left leg numbness and weakness.  Found to have multifocal acute ischemia within the right ACA territory, embolic, source unclear.  A & P  Multifocal acute ischemia right ACA territory, embolic, etiology unclear --Loop recorder planned per neurology --Aspirin, Plavix for 3 months then aspirin alone given right ACA large vessel occlusion --Statin --PT recommended CIR --stop estradiol permanently  Left triquetral avulsion fracture --Continue splint, follow-up with orthopedics as an outpatient  Disposition Plan:  Discussion: Loop recorder per neurology, continue therapy evaluations, CIR  Dispo: The patient is from: Home              Anticipated d/c is to: CIR              Anticipated d/c date is: 2 days              Patient currently is not medically stable to d/c.  DVT prophylaxis: enoxaparin (LOVENOX) injection 40 mg Start: 05/27/20 1715 SCDs Start: 05/27/20 0055   Code Status: Full Code Family Communication: husband at bedside, daughter by telephone  Murray Hodgkins, MD  [facility] Direct contact: see [email or web address] (further directions at bottom of note if needed) 7PM-7AM contact night coverage as at bottom of note 05/27/2020, 6:11 PM  LOS: 0 days   Significant Hospital Events   .    Consults:  .    Procedures:  .   Significant Diagnostic Tests:  Marland Kitchen    Micro Data:  .    Antimicrobials:  .   Interval History/Subjective  CC: f/u left leg weakness  Left leg is perhaps weaker than yesterday, can't move toes but can move leg Left leg still numb  Objective   Vitals:  Vitals:   05/27/20 1745 05/27/20 1800  BP: 133/73 (!) 141/63  Pulse: 68 72  Resp: 17 14  Temp:    SpO2: 98% 97%    Exam:  Constitutional:   . Appears calm and comfortable ENMT:  . grossly normal hearing  Respiratory:  . CTA bilaterally,  no w/r/r.  . Respiratory effort normal.  Cardiovascular:  . RRR, no m/r/g . No LE extremity edema   Musculoskeletal:  . LLE, able to bend the knee without assistance but unable to wiggle toes Neurologic:  . Decreased sensation Psychiatric:  . Mental status o Mood, affect appropriate  I have personally reviewed the following:   Today's Data  . Complete metabolic panel phosphorus and magnesium within normal limits . CBC unremarkable  Scheduled Meds: . aspirin EC  81 mg Oral Daily  . atorvastatin  40 mg Oral Daily  . clopidogrel  75 mg Oral Daily  . enoxaparin (LOVENOX) injection  40 mg Subcutaneous Q24H  . pantoprazole  40 mg Oral Daily   Continuous Infusions:  Principal Problem:   Cerebral ischemia Active Problems:   Acute CVA (cerebrovascular accident) ([place])   Left leg numbness   GERD (gastroesophageal reflux disease)   Left leg weakness   Triquetral fracture   LOS: 0 days   How to contact the [facility] Attending or Consulting provider [place] or covering provider during after hours [place], for this patient?  1. Check the care team in [facility] and look for a) attending/consulting TR[facility] provider listed and b) the [facility] team listed 2. Log into [email or web address] and use [place]'s universal password to access. If you do not have the password, please  contact the hospital operator. 3. Locate the [facility] provider you are looking for under [facility] and page to a number that you can be directly reached. 4. If you still have difficulty reaching the provider, please page the [facility] (Director on Call) for the Hospitalists listed on amion for assistance.

## 2020-05-27 NOTE — Progress Notes (Signed)
  Echocardiogram 2D Echocardiogram has been performed.  [name] 05/27/2020, 10:25 AM

## 2020-05-27 NOTE — ED Notes (Signed)
Lunch and dinner tray ordered.

## 2020-05-27 NOTE — ED Provider Notes (Signed)
[facility] EMERGENCY DEPARTMENT Provider Note   CSN: [id number] Arrival date & time: 05/26/20  1439     History Chief Complaint  Patient presents with  . Left Leg Numbness    [name] is a 79 y.o. female.  HPI     79 year old female comes in with chief complaint of left leg numbness. Patient has history of diverticulosis, GERD and she takes supplemental hormones.  She reports that she woke up this morning feeling well, however once she was getting up after having breakfast she noted that her left leg was paralyzed.  She had numbness as well.  She was last normal around 10-10:15 AM when she sat for her breakfast.  Her symptoms have improved slightly since then.  She also reports having pain in her left wrist due to a fall yesterday.  She does not think her left lower extremity symptoms were present yesterday.  From the fall she is having pain in her wrist.  She was supposed to see orthopedics today.  There is no history of stroke.  Patient denies any upper extremity symptoms, slurred speech, vision change, severe headaches, neck pain.   Past Medical History:  Diagnosis Date  . Blood transfusion without reported diagnosis    with hip fracture-79 years old  . Cancer [facility]) 1983   Kidney Cancer  . Diverticulosis   . GERD (gastroesophageal reflux disease)   . Personal history of kidney cancer     Patient Active Problem List   Diagnosis Date Noted  . Acute CVA (cerebrovascular accident) ([place]) 05/26/2020  . Left leg numbness 05/26/2020  . GERD (gastroesophageal reflux disease) 05/26/2020  . Left forearm fracture 05/26/2020  . 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 Posterior vitreous detachment of both eyes 04/29/2020  . 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 Left epiretinal membrane 04/29/2020  . Pain in right knee 05/26/2018  . Bilateral lower abdominal pain 10/21/2010  .  History of diverticulitis of colon 10/21/2010  . History of Clostridium difficile infection 10/21/2010    Past Surgical History:  Procedure Laterality Date  . ABDOMINAL HYSTERECTOMY    . COLON SURGERY     Severe sigmoid diverticulitis  . COLONOSCOPY    . FOOT SURGERY    . HIP SURGERY    . Kidney cancer     Kidney removed  . POLYPECTOMY       OB History   No obstetric history on file.     Family History  Problem Relation Age of Onset  . Colon cancer Mother   . Coronary artery disease Father   . Breast cancer Neg Hx     Social History   Tobacco Use  . Smoking status: Never Smoker  . Smokeless tobacco: Never Used  Substance Use Topics  . Alcohol use: No  . Drug use: No    Home Medications Prior to Admission medications   Medication Sig Start Date End Date Taking? Authorizing Provider  acetaminophen (TYLENOL) 650 MG CR tablet Take 650 mg by mouth daily.   Yes [provider]  estradiol (ESTRACE) 0.5 MG tablet Take 0.5 mg by mouth every evening.   Yes [provider]  MAGNESIUM PO Take 1 tablet by mouth every evening.   Yes [provider]  Multiple Vitamins-Minerals (MULTIVITAMIN WITH MINERALS) tablet Take 1 tablet by mouth daily.   Yes [provider]  omeprazole (PRILOSEC) 40 MG capsule Take 40 mg  by mouth daily. 10/04/18  Yes [provider]  vitamin C (ASCORBIC ACID) 500 MG tablet Take 500 mg by mouth daily.   Yes [provider]    Allergies    Codeine  Review of Systems   Review of Systems  Constitutional: Positive for activity change.  Respiratory: Negative for shortness of breath.   Cardiovascular: Negative for chest pain.  Gastrointestinal: Negative for nausea and vomiting.  Musculoskeletal: Positive for arthralgias.  Neurological: Positive for weakness and numbness.  Hematological: Does not bruise/bleed easily.  All other systems reviewed and are negative.   Physical Exam Updated Vital  Signs BP (!) 155/75   Pulse 70   Temp 98 F (36.7 C) (Oral)   Resp 17   SpO2 97%   Physical Exam Vitals and nursing note reviewed.  Constitutional:      Appearance: She is well-developed.  HENT:     Head: Normocephalic and atraumatic.  Eyes:     Extraocular Movements: Extraocular movements intact and EOM normal.     Pupils: Pupils are equal, round, and reactive to light.  Neck:     Comments: No midline c-spine tenderness, pt able to turn head to 45 degrees bilaterally without any pain and able to flex neck to the chest and extend without any pain or neurologic symptoms.\  Cardiovascular:     Rate and Rhythm: Normal rate.  Pulmonary:     Effort: Pulmonary effort is normal.  Abdominal:     General: Bowel sounds are normal.  Musculoskeletal:     Cervical back: Normal range of motion and neck supple.  Skin:    General: Skin is warm and dry.  Neurological:     Mental Status: She is alert and oriented to person, place, and time.     Cranial Nerves: No cranial nerve deficit.     Sensory: Sensory deficit present.     Motor: Weakness present.     Coordination: Coordination normal.     Comments: Patient has 4 out of 5 left lower extremity strength. He does appear to be slightly weaker compared to right side.  She also has numbness in her left lower extremity in its entirety.  Skin is warm to touch. Upper extremity strength and sensory exam is grossly normal.     ED Results / Procedures / Treatments   Labs (all labs ordered are listed, but only abnormal results are displayed) Labs Reviewed  COMPREHENSIVE METABOLIC PANEL - Abnormal; Notable for the following components:      Result Value   Glucose, Bld 102 (*)    All other components within normal limits  LIPID PANEL - Abnormal; Notable for the following components:   Cholesterol 244 (*)    LDL Cholesterol 143 (*)    All other components within normal limits  HEMOGLOBIN A1C - Abnormal; Notable for the following components:    Hgb A1c MFr Bld 5.8 (*)    All other components within normal limits  COMPREHENSIVE METABOLIC PANEL - Abnormal; Notable for the following components:   Total Protein 5.7 (*)    Albumin 3.1 (*)    All other components within normal limits  RESP PANEL BY RT-PCR (FLU A&B, COVID) [id number]  PROTIME-INR  APTT  CBC  DIFFERENTIAL  CBC  PROTIME-INR  APTT  MAGNESIUM  PHOSPHORUS  RAPID URINE DRUG SCREEN, HOSP PERFORMED  URINALYSIS, ROUTINE W REFLEX MICROSCOPIC    EKG EKG Interpretation  Date/Time:  Monday May 26 2020 17:32:10 EST Ventricular Rate:  65 PR Interval:  128 QRS Duration: 78 QT Interval:  408 QTC Calculation: 424 R Axis:   24 Text Interpretation: Normal sinus rhythm with sinus arrhythmia Normal ECG since last tracing no significant change Confirmed by Noemi Chapel [phone]) on 05/26/2020 5:40:37 PM   Radiology DG Wrist Complete Left  Result Date: 05/26/2020 CLINICAL DATA:  Fall, pain EXAM: LEFT WRIST - COMPLETE 3+ VIEW COMPARISON:  None. FINDINGS: There is a small fragment dorsal to the mid carpus seen on lateral view. No other fracture or dislocation. Thumb basal arthrosis. Dorsal soft tissue edema. IMPRESSION: There is a small fragment dorsal to the mid carpus seen on lateral view, suspicious for triquetral avulsion fracture. No other fracture or dislocation. Dorsal soft tissue edema. Electronically Signed   By: Eddie Candle M.D.   On: 05/26/2020 15:47   CT HEAD WO CONTRAST  Result Date: 05/26/2020 CLINICAL DATA:  Left leg numbness EXAM: CT HEAD WITHOUT CONTRAST TECHNIQUE: Contiguous axial images were obtained from the base of the skull through the vertex without intravenous contrast. COMPARISON:  None. FINDINGS: Brain: No acute territorial infarction or hemorrhage is visualized. 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 Chronic infarct in the  right cerebellum. Nonenlarged ventricles. Mild white matter hypodensity likely chronic small vessel ischemic change Vascular: No hyperdense vessels.  Carotid vascular calcification Skull: Normal. Negative for fracture Sinuses/Orbits: No acute finding. Other: None IMPRESSION: 1. No CT evidence for acute intracranial abnormality. Mild chronic small vessel ischemic change of the white matter. 2. 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 3. Chronic right cerebellar infarct. Electronically Signed   By: Donavan Foil M.D.   On: 05/26/2020 16:47   MR ANGIO NECK W WO CONTRAST  Result Date: 05/27/2020 CLINICAL DATA:  Left leg numbness EXAM: MRI HEAD WITHOUT AND WITH CONTRAST MRA NECK WITHOUT AND WITH CONTRAST TECHNIQUE: Multiplanar, multiecho pulse sequences of the brain and surrounding structures were obtained without and with intravenous contrast. Angiographic images of the neck were obtained using MRA technique with and without intravenous contrast. Carotid stenosis measurements (when applicable) are obtained utilizing NASCET criteria, using the distal internal carotid diameter as the denominator. CONTRAST:  29mL GADAVIST GADOBUTROL 1 MMOL/ML IV SOLN COMPARISON:  None. FINDINGS: MRI HEAD FINDINGS Brain: Multifocal abnormal diffusion restriction within the right hemisphere, predominantly within the anterior frontal lobe and the medial parietal lobe. No acute or chronic hemorrhage. There is multifocal hyperintense T2-weighted signal within the white matter. Parenchymal volume and CSF spaces are normal. There are old bilateral cerebellar infarcts. 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 Vascular: Major flow voids are preserved. Skull and upper cervical spine: Normal calvarium and skull base. Visualized upper cervical spine and soft tissues are normal. Sinuses/Orbits:No paranasal sinus fluid levels or  advanced mucosal thickening. No mastoid or middle ear effusion. Normal orbits. MRA NECK FINDINGS Normal carotid and vertebral arteries. IMPRESSION: 1. Multifocal acute ischemia within the right hemisphere, predominantly within the anterior frontal lobe and medial parietal lobe. No hemorrhage or mass effect. 2. 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 3. Normal MRA of the neck. Electronically Signed   By: Ulyses Jarred M.D.   On: 05/27/2020 01:16   MR BRAIN W WO CONTRAST  Result Date: 05/27/2020 CLINICAL DATA:  Left leg numbness EXAM: MRI HEAD WITHOUT AND WITH CONTRAST MRA NECK  WITHOUT AND WITH CONTRAST TECHNIQUE: Multiplanar, multiecho pulse sequences of the brain and surrounding structures were obtained without and with intravenous contrast. Angiographic images of the neck were obtained using MRA technique with and without intravenous contrast. Carotid stenosis measurements (when applicable) are obtained utilizing NASCET criteria, using the distal internal carotid diameter as the denominator. CONTRAST:  25mL GADAVIST GADOBUTROL 1 MMOL/ML IV SOLN COMPARISON:  None. FINDINGS: MRI HEAD FINDINGS Brain: Multifocal abnormal diffusion restriction within the right hemisphere, predominantly within the anterior frontal lobe and the medial parietal lobe. No acute or chronic hemorrhage. There is multifocal hyperintense T2-weighted signal within the white matter. Parenchymal volume and CSF spaces are normal. There are old bilateral cerebellar infarcts. 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 Vascular: Major flow voids are preserved. Skull and upper cervical spine: Normal calvarium and skull base. Visualized upper cervical spine and soft tissues are normal. Sinuses/Orbits:No paranasal sinus fluid levels or advanced mucosal thickening. No mastoid or middle ear effusion. Normal orbits. MRA NECK FINDINGS Normal carotid and vertebral arteries. IMPRESSION: 1. Multifocal acute ischemia within  the right hemisphere, predominantly within the anterior frontal lobe and medial parietal lobe. No hemorrhage or mass effect. 2. 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 3. Normal MRA of the neck. Electronically Signed   By: Ulyses Jarred M.D.   On: 05/27/2020 01:16    Procedures Procedures (including critical care time)  Medications Ordered in ED Medications  pantoprazole (PROTONIX) EC tablet 40 mg (40 mg Oral Given 05/26/20 2232)  aspirin EC tablet 81 mg (has no administration in time range)    Or  aspirin suppository 300 mg (has no administration in time range)  clopidogrel (PLAVIX) tablet 75 mg (has no administration in time range)  atorvastatin (LIPITOR) tablet 40 mg (has no administration in time range)  gadobutrol (GADAVIST) 1 MMOL/ML injection 7 mL (7 mLs Intravenous Contrast Given 05/27/20 0057)    ED Course  I have reviewed the triage vital signs and the nursing notes.  Pertinent labs & imaging results that were available during my care of the patient were reviewed by me and considered in my medical decision making (see chart for details).    MDM Rules/Calculators/A&P                          DDx includes:  Stroke - ischemic vs. hemorrhagic TIA Neuropathy Myelitis Electrolyte abnormality Neuropathy Muscular disease  Wrist fracture  Pt comes in with primary complaint of weakness. Please also patient was last normal around 10 to 10:15 AM.  She is complaining of left lower extremity weakness and numbness, her symptoms of weakness has improved since the onset.  It appears that initially she was paralyzed in her left lower extremity but now she has 4 out of 5 strength.  Patient does not have any significant medical history but stroke is high on the differential.  EKG is not showing any evidence of A. fib right now.  She is slightly outside of the stroke window, not a TPA candidate, especially since the stroke symptoms are improving.  Patient's care will be  signed out to Dr. Sabra Heck who is coming in.  CT head does not show any bleed.  Labs are pending.  Patient will need admission to the hospital and neuro consult once the initial work-up is completed.   She had a mechanical fall unrelated to her weakness yesterday.  X-rays were ordered of the left wrist and it shows  questionable fracture.  A volar splint has been placed.  Patient is neurovascularly intact in her left wrist.  She will need orthopedic follow-up pain 7 to 10 days after discharge for this injury.  Prior to me leaving, plan discussed with the patient.   Final Clinical Impression(s) / ED Diagnoses Final diagnoses:  Acute ischemic stroke ([place])  Closed fracture of left wrist, initial encounter    Rx / DC Orders ED Discharge Orders    None       Varney Biles, MD 05/27/20 [phone]

## 2020-05-27 NOTE — Evaluation (Signed)
Physical Therapy Evaluation Patient Details Name: [name] MRN: [id number] DOB: 10/05/1940 Today's Date: 05/27/2020   History of Present Illness  Pt is an 79 y/o female admitted following a fall and LLE numbness and weakness. Found to have L triquestral alvusion fx and is s/p splinting. 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 PMH includes kidney cancer.  Clinical Impression  Pt admitted secondary to problem above with deficits below. Pt requiring max A for bed mobility this session. Pt with posterior lean and requiring mod A to maintain sitting balance. Pt reporting RLE numbness as well and noted decreased ankle AROM. Pt was previously independent prior to admission. Feel she would benefit from CIR level therapies to increase independence and safety. Will continue to follow acutely.     Follow Up Recommendations CIR    Equipment Recommendations  Wheelchair cushion (measurements PT);Wheelchair (measurements PT)    Recommendations for Other Services       Precautions / Restrictions Precautions Precautions: Fall Restrictions Weight Bearing Restrictions: Yes LUE Weight Bearing: Non weight bearing Other Position/Activity Restrictions: Assumed NWB on L wrist      Mobility  Bed Mobility Overal bed mobility: Needs Assistance Bed Mobility: Supine to Sit;Sit to Supine     Supine to sit: Max assist Sit to supine: Max assist   General bed mobility comments: Max A for LE and trunk assist. Increased time to perform. Pt with posterior lean in sitting, so unsafe to attempt further mobility from higher stretcher height.    Transfers                    Ambulation/Gait                Stairs            Wheelchair Mobility    Modified Rankin (Stroke Patients Only) Modified Rankin (Stroke Patients Only) Pre-Morbid Rankin Score: No symptoms Modified Rankin: Moderately severe disability     Balance Overall balance  assessment: Needs assistance Sitting-balance support: Single extremity supported Sitting balance-Leahy Scale: Poor Sitting balance - Comments: Reliant on at least mod A to maintain sitting balance                                     Pertinent Vitals/Pain Pain Assessment: No/denies pain    Home Living Family/patient expects to be discharged to:: Private residence Living Arrangements: Spouse/significant other Available Help at Discharge: Family Type of Home: House Home Access: Stairs to enter Entrance Stairs-Rails: None Entrance Stairs-Number of Steps: 1 (~9 inch step) Home Layout: One level Home Equipment: Walker - 2 wheels      Prior Function Level of Independence: Independent         Comments: Was independent with ambulation prior to admission     Hand Dominance        Extremity/Trunk Assessment   Upper Extremity Assessment Upper Extremity Assessment: Defer to OT evaluation    Lower Extremity Assessment Lower Extremity Assessment: LLE deficits/detail LLE Deficits / Details: Reporting LLE numbness; reports it is worse on the outer portion of leg. Very minimal movement at ankle. Able to perform PROM at ankle to neutral. Able to perform SLR and heel slide.    Cervical / Trunk Assessment Cervical / Trunk Assessment: Normal  Communication   Communication: No difficulties  Cognition Arousal/Alertness: Awake/alert Behavior During Therapy: WFL for tasks assessed/performed Overall Cognitive  Status: Impaired/Different from baseline Area of Impairment: Problem solving;Safety/judgement                         Safety/Judgement: Decreased awareness of deficits   Problem Solving: Slow processing        General Comments General comments (skin integrity, edema, etc.): Pt's husband present    Exercises     Assessment/Plan    PT Assessment Patient needs continued PT services  PT Problem List Decreased strength;Decreased activity  tolerance;Decreased balance;Decreased mobility;Decreased cognition;Decreased coordination;Decreased knowledge of use of DME;Decreased knowledge of precautions;Pain;Impaired sensation       PT Treatment Interventions DME instruction;Gait training;Functional mobility training;Therapeutic activities;Therapeutic exercise;Balance training;Patient/family education    PT Goals (Current goals can be found in the Care Plan section)  Acute Rehab PT Goals Patient Stated Goal: to go home PT Goal Formulation: With patient Time For Goal Achievement: 06/10/20    Frequency Min 4X/week   Barriers to discharge        Co-evaluation               AM-PAC PT "6 Clicks" Mobility  Outcome Measure Help needed turning from your back to your side while in a flat bed without using bedrails?: A Lot Help needed moving from lying on your back to sitting on the side of a flat bed without using bedrails?: A Lot Help needed moving to and from a bed to a chair (including a wheelchair)?: Total Help needed standing up from a chair using your arms (e.g., wheelchair or bedside chair)?: Total Help needed to walk in hospital room?: Total Help needed climbing 3-5 steps with a railing? : Total 6 Click Score: 8    End of Session   Activity Tolerance: Patient tolerated treatment well Patient left: in bed;with call bell/phone within reach;with family/visitor present (on stretcher in ED) Nurse Communication: Mobility status PT Visit Diagnosis: Unsteadiness on feet (R26.81);History of falling (Z91.81);Muscle weakness (generalized) (M62.81)    Time: 9390-3009 PT Time Calculation (min) (ACUTE ONLY): 20 min   Charges:   PT Evaluation $PT Eval Moderate Complexity: 1 Mod          [name], PT, DPT  Acute Rehabilitation Services  Pager: [phone] Office: [phone]   [name] 05/27/2020, 5:10 PM

## 2020-05-27 NOTE — ED Notes (Signed)
Dinner Tray Ordered @ 1657. 

## 2020-05-27 NOTE — Progress Notes (Signed)
Lower extremity venous has been completed.   Preliminary results in CV Proc.   Abram Sander 05/27/2020 10:08 AM

## 2020-05-27 NOTE — Hospital Course (Addendum)
79 year old presented with left leg numbness and weakness.  Found to have multifocal acute ischemia within the right ACA territory, embolic, source unclear.

## 2020-05-28 ENCOUNTER — Inpatient Hospital Stay (HOSPITAL_COMMUNITY): Payer: PPO

## 2020-05-28 DIAGNOSIS — I6782 Cerebral ischemia: Secondary | ICD-10-CM

## 2020-05-28 DIAGNOSIS — S62102A Fracture of unspecified carpal bone, left wrist, initial encounter for closed fracture: Secondary | ICD-10-CM

## 2020-05-28 LAB — HEMOGLOBIN A1C
Hgb A1c MFr Bld: 5.7 % — ABNORMAL HIGH (ref 4.8–5.6)
Mean Plasma Glucose: 116.89 mg/dL

## 2020-05-28 LAB — LIPID PANEL
Cholesterol: 234 mg/dL — ABNORMAL HIGH (ref 0–200)
HDL: 68 mg/dL (ref >40)
LDL Cholesterol: 139 mg/dL — ABNORMAL HIGH (ref 0–99)
Total CHOL/HDL Ratio: 3.4 RATIO
Triglycerides: 135 mg/dL (ref <150)
VLDL: 27 mg/dL (ref 0–40)

## 2020-05-28 MED ORDER — POLYETHYLENE GLYCOL 3350 17 G PO PACK
17.0000 g | PACK | Freq: Every day | ORAL | Status: DC
  Administered 2020-05-28 – 2020-05-31 (×4): 17 g via ORAL
  Filled 2020-05-28 (×4): qty 1

## 2020-05-28 MED ORDER — SENNOSIDES-DOCUSATE SODIUM 8.6-50 MG PO TABS
1.0000 | ORAL_TABLET | Freq: Two times a day (BID) | ORAL | Status: DC
  Administered 2020-05-28 – 2020-05-31 (×7): 1 via ORAL
  Filled 2020-05-28 (×7): qty 1

## 2020-05-28 MED ORDER — TRAMADOL HCL 50 MG PO TABS
50.0000 mg | ORAL_TABLET | Freq: Four times a day (QID) | ORAL | Status: DC | PRN
  Administered 2020-05-29 – 2020-05-30 (×4): 50 mg via ORAL
  Filled 2020-05-28 (×4): qty 1

## 2020-05-28 NOTE — Progress Notes (Signed)
Inpatient Rehab Admissions:  Inpatient Rehab Consult received.  I met with patient and her daughter, Lattie Haw, at the bedside for rehabilitation assessment and to discuss goals and expectations of an inpatient rehab admission.  We discussed expected length of stay to be 2-3 weeks, and goals of supervision to min assist.  We discussed hired caregivers, if needed, for additional support at discharge from [facility] term care policy.  Will start insurance authorization process and continue to follow for potential admit pending medical readiness, insurance auth, and bed availability.   Signed: Shann Medal, PT, DPT Admissions Coordinator [phone] 05/28/20  4:39 PM

## 2020-05-28 NOTE — Progress Notes (Signed)
STROKE TEAM PROGRESS NOTE   INTERVAL HISTORY Her daughter is at the bedside. Pt left leg weakness much improved, able to against gravity, still not back to baseline yet. PT/OT recommend CIR. Loop recorder may be placed before going to CIR.   OBJECTIVE Vitals:   05/27/20 2259 05/28/20 0020 05/28/20 0046 05/28/20 0459  BP: 124/68 (!) 162/66 (!) 160/67 (!) 161/71  Pulse: 68 83 68 66  Resp: 19 18 18    Temp:  98.3 F (36.8 C) 98.5 F (36.9 C) 98.4 F (36.9 C)  TempSrc:   Oral Oral  SpO2: 97% 98% 97% 99%    CBC:  Recent Labs  Lab 05/26/20 1639 05/27/20 0240  WBC 9.2 5.6  NEUTROABS 7.0  --   HGB 13.7 12.5  HCT 43.6 38.3  MCV 94.0 92.7  PLT 339 381    Basic Metabolic Panel:  Recent Labs  Lab 05/26/20 1639 05/27/20 0240  NA 138 138  K 4.3 3.7  CL 102 104  CO2 26 26  GLUCOSE 102* 93  BUN 9 9  CREATININE 0.82 0.86  CALCIUM 9.3 9.0  MG  --  2.1  PHOS  --  3.2    Lipid Panel:     Component Value Date/Time   CHOL 234 (H) 05/28/2020 0334   TRIG 135 05/28/2020 0334   HDL 68 05/28/2020 0334   CHOLHDL 3.4 05/28/2020 0334   VLDL 27 05/28/2020 0334   LDLCALC 139 (H) 05/28/2020 0334   HgbA1c:  Lab Results  Component Value Date   HGBA1C 5.7 (H) 05/28/2020   Urine Drug Screen: No results found for: LABOPIA, COCAINSCRNUR, LABBENZ, AMPHETMU, THCU, LABBARB  Alcohol Level No results found for: [facility]  IMAGING  DG Wrist Complete Left 05/26/2020 IMPRESSION:  There is a small fragment dorsal to the mid carpus seen on lateral view, suspicious for triquetral avulsion fracture. No other fracture or dislocation. Dorsal soft tissue edema.   CT HEAD WO CONTRAST 05/26/2020 IMPRESSION:  1. No CT evidence for acute intracranial abnormality. Mild chronic small vessel ischemic change of the white matter.  2. 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  3. Chronic right cerebellar infarct.    MR ANGIO HEAD WO CONTRAST 05/27/2020 IMPRESSION:  Right A2 ACA occlusion. Areas of infarction on prior study could reflect watershed ischemia. Moderate right supraclinoid ICA stenosis.   MR BRAIN W WO CONTRAST  MR ANGIO NECK W WO CONTRAST        05/27/2020 IMPRESSION:  1. Multifocal acute ischemia within the right hemisphere, predominantly within the anterior frontal lobe and medial parietal lobe. No hemorrhage or mass effect.  2. 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  3. Normal MRA of the neck.   ECHOCARDIOGRAM COMPLETE 05/27/2020 IMPRESSIONS   1. Left ventricular ejection fraction, by estimation, is 60 to 65%. The left ventricle has normal function. The left ventricle has no regional wall motion abnormalities. Left ventricular diastolic parameters are consistent with Grade I diastolic dysfunction (impaired relaxation).   2. Right ventricular systolic function is normal. The right ventricular size is normal. There is normal pulmonary artery systolic pressure.   3. The mitral valve is normal in structure. Trivial mitral valve regurgitation.   4. The aortic valve is tricuspid. There is mild calcification of the aortic valve. There is mild thickening of the aortic valve. Aortic valve regurgitation is not visualized. Mild aortic valve sclerosis is present, with  no evidence of aortic valve stenosis.   5. The inferior vena cava is normal in size with greater than 50% respiratory variability, suggesting right atrial pressure of 3 mmHg. Comparison(s): No prior Echocardiogram.  Conclusion(s)/Recommendation(s): No intracardiac source of embolism detected on this transthoracic study. A transesophageal echocardiogram is recommended to exclude cardiac source of embolism if clinically indicated.   VAS [place] LOWER EXTREMITY VENOUS (DVT) 05/27/2020 Summary:  BILATERAL:   No evidence of deep vein thrombosis seen in the lower extremities, bilaterally. - No evidence of superficial venous  thrombosis in the lower extremities, bilaterally. -No evidence of popliteal cyst, bilaterally.   Final    ECG - SR rate 65 BPM. (See cardiology reading for complete details)   PHYSICAL EXAM  Temp:  [98 F (36.7 C)-98.5 F (36.9 C)] 98.4 F (36.9 C) (12/15 0459) Pulse Rate:  [64-83] 66 (12/15 0459) Resp:  [14-22] 18 (12/15 0046) BP: (124-167)/(63-105) 161/71 (12/15 0459) SpO2:  [95 %-99 %] 99 % (12/15 0459)  General - Well nourished, well developed, in no apparent distress.  Ophthalmologic - fundi not visualized due to noncooperation.  Cardiovascular - Regular rhythm and rate.  Mental Status -  Level of arousal and orientation to time, place, and person were intact. Language including expression, naming, repetition, comprehension was assessed and found intact. Fund of Knowledge was assessed and was intact.  Cranial Nerves II - XII - II - Visual field intact OU. III, IV, VI - Extraocular movements intact. V - Facial sensation intact bilaterally. VII - Facial movement intact bilaterally. VIII - Hearing & vestibular intact bilaterally. X - Palate elevates symmetrically. XI - Chin turning & shoulder shrug intact bilaterally. XII - Tongue protrusion intact.  Motor Strength - The patient's strength was normal in both upper extremities and pronator drift was absent.  Right lower extremity 5/5, left lower extremity 4/5 proximal and distal.  Bulk was normal and fasciculations were absent.   Motor Tone - Muscle tone was assessed at the neck and appendages and was normal.  Reflexes - The patient's reflexes were symmetrical in all extremities and [name] had no pathological reflexes.  Sensory - Light touch, temperature/pinprick were assessed and were decreased light touch sensation at left lower extremity..    Coordination - The patient had normal movements in the hands and feet with no ataxia or dysmetria.  Tremor was absent.  Gait and Station - deferred.   ASSESSMENT/PLAN [name] is a 79 y.o. female with history of kidney cancer (nephrectomy), previous stroke by imaging, diverticulosis, idiopathic peripheral neuropathy, and hip fracture at the age of 60 from [place] (left leg now shorter than right) presents to ED with several day hx of LLE numbness / weakness resulting in a fall with left wrist fracture. LLE weakness resolved in ED.  [name] did not receive IV t-PA due to resolution of deficits.  Stroke: Multifocal acute ischemia within the right ACA territory - embolic - source unclear.  CT head - No CT evidence for acute intracranial abnormality. Chronic right cerebellar infarct. Mild chronic small vessel ischemic change of the white matter. 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   MRI head -  Multifocal acute ischemia within the right hemisphere, predominantly within the anterior frontal lobe and medial parietal lobe. 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   MRA head - Right A2 ACA occlusion. Areas of infarction on prior study could  reflect watershed ischemia. Moderate right supraclinoid ICA stenosis.   MRA Neck - Normal MRA of the neck.   2D Echo - EF 60 - 65%. No cardiac source of emboli identified.   LE venous doppler neg for DVT  Recommend loop recorder to rule out afib - EP on board, plan for loop before CIR  [employer] Virus 2 - negative  LDL - 143   HGBA1C - 5.8  VTE prophylaxis - SCDs  No antithrombotic prior to admission, now on aspirin 81 mg daily and clopidogrel 75 mg daily DAPT for 3 months and then ASA alone given right ACA large vessel occlusion.  Patient counseled to be compliant with her antithrombotic medications  Ongoing aggressive stroke risk factor management  Therapy recommendations:  CIR  Disposition:  Pending  Hyperlipidemia  Home Lipid lowering medication: none   LDL 143, goal < 70  Current lipid  lowering medication: Lipitor 40 mg daily   Continue statin at discharge  left wrist fracture  X-ray a small fragment dorsal to the mid carpus seen on lateral view, suspicious for triquetral avulsion fracture  Management per primary team  Other Stroke Risk Factors  Advanced age  Hx stroke/TIA on imaging  Other Active Problems, Findings and Recommendations  Code status - Full code   Hospital day # 1  Neurology will sign off. Please call with questions. Pt will follow up with stroke clinic NP at [facility] in about 4 weeks. Thanks for the consult.  Rosalin Hawking, MD PhD Stroke Neurology 05/28/2020 6:59 PM    To contact Stroke Continuity provider, please refer to [email or web address] After hours, contact General Neurology

## 2020-05-28 NOTE — Progress Notes (Signed)
[facility] Progress Note  Patient: [name]    FWY:[id number]  DOA: 05/26/2020     Date of Service: the patient was seen and examined on 05/28/2020  Brief hospital course: Past medical history of chronic pain syndrome, renal cell carcinoma SP right nephrectomy, bilateral rotator cuff injury presents with complaints of left leg numbness and a fall.  Found to have acute CVA and triquetral avulsion fracture. Currently plan is CIR.  Assessment and Plan: 1.  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 MRI brain shows multifocal ischemic lesions on the right hemisphere in the [place] territory. MRA shows right ACA occlusion. Echocardiogram shows 60 to 65% EF.  No emboli in the cardiac echocardiogram. Dopplers negative for DVT. SVT/PAC on telemetry so far, no A. fib. Loop recorder when the patient is close to discharge. LDL 143 not on any medication prior to admission. Currently on 81 mg aspirin daily +75 mg Plavix daily. DAPT for 3 months followed by aspirin alone. PT OT recommends CIR. Passed swallowing evaluation.  2.  Hyperlipidemia LDL 143. Currently on 40 mg daily Lipitor.  Continue.  3.  Fall Left wrist fracture Fall occurred secondary to stroke. X-ray shows triquetral avulsion fracture. Currently splint splint. Orthopedic recommends outpatient follow-up. Pain tolerable with Tylenol but frequency is not enough. We will add tramadol.  4.  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 We will consider orthopedics for shoulder injection.  Diet: Cardiac diet DVT Prophylaxis:   enoxaparin (LOVENOX) injection 40 mg Start: 05/27/20 1715 SCDs Start: 05/27/20 0055    Advance goals of care discussion: Full code  Family Communication: family was present at bedside, at the time of interview.  The pt provided permission to discuss  medical plan with the family. Opportunity was given to ask question and all questions were answered satisfactorily.   Disposition:  Status is: Inpatient  Remains inpatient appropriate because:Ongoing diagnostic testing needed not appropriate for outpatient work up and Unsafe d/c plan   Dispo: The patient is from: Home              Anticipated d/c is to: CIR              Anticipated d/c date is: 1 day              Patient currently is medically stable to d/c.        Subjective: No nausea no vomiting or no fever no chills.  Complains of bilateral shoulder pain.  No diarrhea no constipation.  Physical Exam:  General: Appear in mild distress, NO Rash; Oral Mucosa Clear, moist. no Abnormal Neck Mass Or lumps, Conjunctiva normal  Cardiovascular: S1 and S2 Present, no Murmur, Respiratory: good respiratory effort, Bilateral Air entry present and CTA, no Crackles, no wheezes Abdomen: Bowel Sound present, Soft and no tenderness Extremities: no Pedal edema Neurology: alert and oriented to time, place, and person affect appropriate. no new focal deficit Gait not checked due to patient safety concerns  Vitals:   05/28/20 0858 05/28/20 1100 05/28/20 1300 05/28/20 1541  BP: (!) 145/71 (!) 113/52 (!) 128/53 (!) 136/57  Pulse: 92 72 80 78  Resp: 20  18 18   Temp: 98.2 F (36.8 C)  97.7 F (36.5 C) 99 F (37.2 C)  TempSrc: Oral  Oral Oral  SpO2: 97%  95% 96%    Intake/Output Summary (Last 24 hours) at 05/28/2020  [place] filed at 05/28/2020 0917 Gross per 24 hour  Intake 150 ml  Output --  Net 150 ml   There were no vitals filed for this visit.  Data Reviewed: I have personally reviewed and interpreted daily labs, tele strips, imagings as discussed above. I reviewed all nursing notes, pharmacy notes, vitals, pertinent old records I have discussed plan of care as described above with RN and patient/family.  CBC: Recent Labs  Lab 05/26/20 1639 05/27/20 0240  WBC 9.2  5.6  NEUTROABS 7.0  --   HGB 13.7 12.5  HCT 43.6 38.3  MCV 94.0 92.7  PLT 339 740   Basic Metabolic Panel: Recent Labs  Lab 05/26/20 1639 05/27/20 0240  NA 138 138  K 4.3 3.7  CL 102 104  CO2 26 26  GLUCOSE 102* 93  BUN 9 9  CREATININE 0.82 0.86  CALCIUM 9.3 9.0  MG  --  2.1  PHOS  --  3.2    Studies: DG Shoulder Left Port  Result Date: 05/28/2020 CLINICAL DATA:  Left shoulder pain after fall. EXAM: LEFT SHOULDER COMPARISON:  None. FINDINGS: There is no evidence of fracture or dislocation. There is no evidence of arthropathy or other focal bone abnormality. Soft tissues are unremarkable. IMPRESSION: Negative. Electronically Signed   By: Marijo Conception M.D.   On: 05/28/2020 15:39   DG Shoulder Right Port  Result Date: 05/28/2020 CLINICAL DATA:  Chronic right shoulder pain. EXAM: PORTABLE RIGHT SHOULDER COMPARISON:  None. FINDINGS: There is no evidence of fracture or dislocation. Moderate degenerative changes seen involving the right acromioclavicular joint. Soft tissues are unremarkable. 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 No acute abnormality seen in the right shoulder. Electronically Signed   By: Marijo Conception M.D.   On: 05/28/2020 15:39    Scheduled Meds: .  stroke: mapping our early stages of recovery book   Does not apply Once  . aspirin EC  81 mg Oral Daily  . atorvastatin  40 mg Oral Daily  . clopidogrel  75 mg Oral Daily  . enoxaparin (LOVENOX) injection  40 mg Subcutaneous Q24H  . pantoprazole  40 mg Oral Daily  . polyethylene glycol  17 g Oral Daily  . senna-docusate  1 tablet Oral BID   Continuous Infusions: PRN Meds: acetaminophen, traMADol  Time spent: 35 minutes  Author: Berle Mull, MD [facility] 05/28/2020 5:35 PM  To reach On-call, see care teams to locate the attending and reach out via [email or web address]. Between 7PM-7AM, please contact night-coverage If you still have difficulty  reaching the attending provider, please page the [facility] (Director on Call) for [facility] on amion for assistance.

## 2020-05-28 NOTE — Evaluation (Signed)
Clinical/Bedside Swallow Evaluation Patient Details  Name: [name] MRN: [id number] Date of Birth: 1940-11-14  Today's Date: 05/28/2020 Time: SLP Start Time (ACUTE ONLY): 7902 SLP Stop Time (ACUTE ONLY): 0934 SLP Time Calculation (min) (ACUTE ONLY): 10 min  Past Medical History:  Past Medical History:  Diagnosis Date  . Blood transfusion without reported diagnosis    with hip fracture-79 years old  . Cancer [facility]) 1983   Kidney Cancer  . Diverticulosis   . GERD (gastroesophageal reflux disease)   . Personal history of kidney cancer    Past Surgical History:  Past Surgical History:  Procedure Laterality Date  . ABDOMINAL HYSTERECTOMY    . COLON SURGERY     Severe sigmoid diverticulitis  . COLONOSCOPY    . FOOT SURGERY    . HIP SURGERY    . Kidney cancer     Kidney removed  . POLYPECTOMY     HPI:  Pt is an 79 y.o. female with medical history significant hip fracture at age of 70, chronic pain syndrome, kidney cancer s/p right nephrectomy (per patient) who presented to the ED due to 2 to 3-day onset of left leg numbness (from knee to foot).  MRI brain: Multifocal acute ischemia within the right hemisphere, predominantly within the anterior frontal lobe and medial parietal lobe. No hemorrhage or mass effect.   Assessment / Plan / Recommendation Clinical Impression  Pt was seen for bedside swallow evaluation and she denied a history of dysphagia. Oral mechanism exam was [facility] and dentition was adequate. Throat clearing was intermittently noted with thin liquids via straw and with regular texture solids, but other consistencies were tolerated without symptoms of oropharyngeal dysphagia. It is recommended that a regular texture diet with thin liquids be continued. However, a modified barium swallow study will be conducted to further assess swallow function. SLP Visit Diagnosis: Dysphagia, unspecified (R13.10)    Aspiration Risk  No limitations    Diet Recommendation  Regular;Thin liquid   Liquid Administration via: Straw;Cup Medication Administration: Whole meds with liquid Supervision: Staff to assist with self feeding Postural Changes: Seated upright at 90 degrees    Other  Recommendations Oral Care Recommendations: Oral care BID   Follow up Recommendations  (TBD)      Frequency and Duration min 2x/week  2 weeks       Prognosis        Swallow Study   General Date of Onset: 05/27/20 HPI: Pt is an 79 y.o. female with medical history significant hip fracture at age of 63, chronic pain syndrome, kidney cancer s/p right nephrectomy (per patient) who presented to the ED due to 2 to 3-day onset of left leg numbness (from knee to foot).  MRI brain: Multifocal acute ischemia within the right hemisphere, predominantly within the anterior frontal lobe and medial parietal lobe. No hemorrhage or mass effect. Type of Study: Bedside Swallow Evaluation Previous Swallow Assessment: None Diet Prior to this Study: Regular;Thin liquids Temperature Spikes Noted: No Respiratory Status: Room air History of Recent Intubation: No Behavior/Cognition: Alert;Cooperative;Pleasant mood Oral Cavity Assessment: Within Functional Limits Oral Care Completed by SLP: No Oral Cavity - Dentition: Adequate natural dentition Vision: Functional for self-feeding Self-Feeding Abilities: Able to feed self Patient Positioning: Upright in bed;Postural control adequate for testing Baseline Vocal Quality: Normal Volitional Cough: Strong Volitional Swallow: Able to elicit    Oral/Motor/Sensory Function Overall Oral Motor/Sensory Function: Within functional limits   Ice Chips Ice chips: Within functional limits Presentation: Spoon   Thin Liquid  Thin Liquid: Impaired Presentation: Straw Pharyngeal  Phase Impairments: Throat Clearing - Immediate    Nectar Thick Nectar Thick Liquid: Not tested   Honey Thick Honey Thick Liquid: Not tested   Puree Puree: Within functional  limits Presentation: Spoon   Solid     Solid: Impaired Presentation: Self Fed Pharyngeal Phase Impairments: Throat Clearing - Immediate     [name] I. [name], [place], [place] Office number [phone] Pager [phone]  [name] 05/28/2020,10:50 AM

## 2020-05-28 NOTE — Progress Notes (Signed)
PT recommendation is for CIR. Will follow along for loop based on timing of discharge.   Legrand Como [address]" [place], PA-C  05/28/2020 7:56 AM

## 2020-05-28 NOTE — Consult Note (Signed)
Physical Medicine and Rehabilitation Consult  Reason for Consult:  Stroke with functional deficits Referring Physician: Dr. Berle Mull   HPI: [name] is a 79 y.o. 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  During her ED stay left lower extremity weakness had resolved and she did not receive tPA.  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  2D echo showed EF 65% with no wall and mild aortic valve sclerosis.  BLE Dopplers were negative for DVT.  Patient had recurrent LLE weakness and  Dr. 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 Loop recorder recommended to rule out A fib and DAPT x3 months to be followed by ASA alone. Work up/therapy evaluations underway. Patient with resultant right sided weakness with numbness, posterior lean with sitting and inability to stand.  Assumed to be NWB Left wrist due to triquetral avulsion fracture. Marland Kitchen CIR recommended due to functional decline.   Pt states wrist pain started after fall ~2d PTA.  Also notes an episode of LLE numbness occurring >several days PTA but this occurred after resting left lateral knee against hand of cabinet while sitting at her bar.   Fall occurred 2d prior to admission, pt developed Left side weakness on day of admission shortly before a scheduled orthopedica appt  Patient has been reporting right shoulder pain since fall and has difficulty with extension at shoulder. Daughter in room with multiple questions regarding "mini  stroke". 30 minutes spent with patient and daughter discussing results and recommendations, work up that is ongoing with loop recorder pending, therapies available/CIR, insurance process in addition to records review and exam.    Review of Systems  Constitutional: Negative for chills and fever.  HENT: Negative for hearing loss.   Eyes: Negative for blurred vision and double vision.  Respiratory: Negative for cough and shortness of breath.   Cardiovascular: Negative for chest pain and palpitations.  Gastrointestinal: Negative for abdominal pain, heartburn and nausea.  Genitourinary: Negative for dysuria, frequency and urgency.  Musculoskeletal: Positive for back pain (worse since fall), joint pain and myalgias.  Skin: Negative for rash.  Neurological: Positive for sensory change, speech change, weakness and headaches (worse). Negative for dizziness.  Psychiatric/Behavioral: Negative for depression and suicidal ideas. The patient is not nervous/anxious and does not have insomnia.      Past Medical History:  Diagnosis Date  . Blood transfusion without reported diagnosis    with hip fracture-79 years old  . Cancer [facility]) 1983   Kidney Cancer  . Diverticulosis   . GERD (gastroesophageal reflux disease)   . Personal history of kidney cancer     Past Surgical History:  Procedure Laterality Date  . ABDOMINAL HYSTERECTOMY    . COLON SURGERY     Severe sigmoid diverticulitis  . COLONOSCOPY    . FOOT SURGERY    . HIP SURGERY    . Kidney cancer     Kidney removed  . POLYPECTOMY      Family History  Problem Relation Age of Onset  . Colon cancer Mother   .  Coronary artery disease Father   . Breast cancer Neg Hx     Social History:  Married. Retired from [employer] --used to work in Publishing rights manager. She reports that she has never smoked. She has never used smokeless tobacco. She reports that she does not drink alcohol and does not use drugs.    Allergies  Allergen Reactions   . Codeine Other (See Comments)    Pt unsure if really allergic to this    Medications Prior to Admission  Medication Sig Dispense Refill  . acetaminophen (TYLENOL) 650 MG CR tablet Take 650 mg by mouth daily.    Marland Kitchen estradiol (ESTRACE) 0.5 MG tablet Take 0.5 mg by mouth every evening.    Marland Kitchen MAGNESIUM PO Take 1 tablet by mouth every evening.    . Multiple Vitamins-Minerals (MULTIVITAMIN WITH MINERALS) tablet Take 1 tablet by mouth daily.    Marland Kitchen omeprazole (PRILOSEC) 40 MG capsule Take 40 mg by mouth daily.    . vitamin C (ASCORBIC ACID) 500 MG tablet Take 500 mg by mouth daily.      Home: Home Living Family/patient expects to be discharged to:: Private residence Living Arrangements: Spouse/significant other Available Help at Discharge: Family Type of Home: House Home Access: Stairs to enter [employer] of Steps: 1 (~9 inch step) Entrance Stairs-Rails: None Home Layout: One level Home Equipment: Environmental consultant - 2 wheels  Functional History: Prior Function Level of Independence: Independent Comments: Was independent with ambulation prior to admission Functional Status:  Mobility: Bed Mobility Overal bed mobility: Needs Assistance Bed Mobility: Supine to Sit,Sit to Supine Supine to sit: Max assist Sit to supine: Max assist General bed mobility comments: Max A for LE and trunk assist. Increased time to perform. Pt with posterior lean in sitting, so unsafe to attempt further mobility from higher stretcher height.        ADL:    Cognition: Cognition Overall Cognitive Status: Impaired/Different from baseline Orientation Level: Oriented to person,Oriented to place,Oriented to situation Cognition Arousal/Alertness: Awake/alert Behavior During Therapy: WFL for tasks assessed/performed Overall Cognitive Status: Impaired/Different from baseline Area of Impairment: Problem solving,Safety/judgement Safety/Judgement: Decreased awareness of deficits Problem Solving: Slow  processing   Blood pressure (!) 161/71, pulse 66, temperature 98.4 F (36.9 C), temperature source Oral, resp. rate 18, SpO2 99 %. Physical Exam Vitals and nursing note reviewed.  Constitutional:      Appearance: Normal appearance.  HENT:     Head: Normocephalic and atraumatic.  Eyes:     Extraocular Movements: Extraocular movements intact.     Conjunctiva/sclera: Conjunctivae normal.     Pupils: Pupils are equal, round, and reactive to light.  Cardiovascular:     Rate and Rhythm: Normal rate and regular rhythm.     Pulses: Normal pulses.     Heart sounds: Normal heart sounds. No murmur heard.   Pulmonary:     Effort: Pulmonary effort is normal. No respiratory distress.     Breath sounds: Normal breath sounds. No stridor.  Abdominal:     General: Abdomen is flat. Bowel sounds are normal. There is no distension.     Palpations: Abdomen is soft.  Musculoskeletal:     Comments: LLE leg length discrepancy with tight heel cord.   Left wrist splint, no pain with left finger/thumb movement  Skin:    General: Skin is warm and dry.  Neurological:     Mental Status: She is alert and oriented to person, place, and time.     Comments: Mild left facial weakness  with dysarthria. Able to follow simple commands without difficulty. Left sided weakness.   Motor 5/5 R Delt, Bi, tri, grip, HF, KE, ADF  3- Left delt , bi, tri, 2- finger flexion and ext, WE not assessed due to splint  3- Left hip flex, knee ext, 2- ankle DF/PF  Psychiatric:        Mood and Affect: Mood normal.        Behavior: Behavior normal.   sensation intact LT and proprio LLE  Intact LT LUE  Results for orders placed or performed during the hospital encounter of 05/26/20 (from the past 24 hour(s))  Hemoglobin A1c     Status: Abnormal   Collection Time: 05/28/20  3:34 AM  Result Value Ref Range   Hgb A1c MFr Bld 5.7 (H) 4.8 - 5.6 %   Mean Plasma Glucose 116.89 mg/dL  Lipid panel     Status: Abnormal    Collection Time: 05/28/20  3:34 AM  Result Value Ref Range   Cholesterol 234 (H) 0 - 200 mg/dL   Triglycerides 135 <150 mg/dL   HDL 68 >40 mg/dL   Total CHOL/HDL Ratio 3.4 RATIO   VLDL 27 0 - 40 mg/dL   LDL Cholesterol 139 (H) 0 - 99 mg/dL   DG Wrist Complete Left  Result Date: 05/26/2020 CLINICAL DATA:  Fall, pain EXAM: LEFT WRIST - COMPLETE 3+ VIEW COMPARISON:  None. FINDINGS: There is a small fragment dorsal to the mid carpus seen on lateral view. No other fracture or dislocation. Thumb basal arthrosis. Dorsal soft tissue edema. IMPRESSION: There is a small fragment dorsal to the mid carpus seen on lateral view, suspicious for triquetral avulsion fracture. No other fracture or dislocation. Dorsal soft tissue edema. Electronically Signed   By: Eddie Candle M.D.   On: 05/26/2020 15:47   CT HEAD WO CONTRAST  Result Date: 05/26/2020 CLINICAL DATA:  Left leg numbness EXAM: CT HEAD WITHOUT CONTRAST TECHNIQUE: Contiguous axial images were obtained from the base of the skull through the vertex without intravenous contrast. COMPARISON:  None. FINDINGS: Brain: No acute territorial infarction or hemorrhage is visualized. 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 Chronic infarct in the right cerebellum. Nonenlarged ventricles. Mild white matter hypodensity likely chronic small vessel ischemic change Vascular: No hyperdense vessels.  Carotid vascular calcification Skull: Normal. Negative for fracture Sinuses/Orbits: No acute finding. Other: None IMPRESSION: 1. No CT evidence for acute intracranial abnormality. Mild chronic small vessel ischemic change of the white matter. 2. 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 3. Chronic right cerebellar infarct. Electronically Signed   By: Donavan Foil M.D.   On: 05/26/2020 16:47   MR ANGIO HEAD WO CONTRAST  Result Date: 05/27/2020 CLINICAL DATA:  Left leg numbness, abnormal MRI brain EXAM: MRA HEAD WITHOUT CONTRAST TECHNIQUE: Angiographic images of the Circle of Willis were obtained using MRA technique without intravenous contrast. COMPARISON:  None. FINDINGS: Intracranial internal carotid arteries are patent with atherosclerotic irregularity and moderate stenosis of the right supraclinoid ICA. Proximal middle cerebral arteries are patent. There is occlusion of the right A2 ACA. Included intracranial vertebral arteries, basilar artery, posterior cerebral arteries are patent. IMPRESSION: Right A2 ACA occlusion. Areas of infarction on prior study could reflect watershed ischemia. Moderate right supraclinoid ICA stenosis. Electronically Signed   By:  Macy Mis M.D.   On: 05/27/2020 11:38   MR ANGIO NECK W WO CONTRAST  Result Date: 05/27/2020 CLINICAL DATA:  Left leg numbness EXAM: MRI HEAD WITHOUT AND WITH CONTRAST MRA NECK WITHOUT AND WITH CONTRAST TECHNIQUE: Multiplanar, multiecho pulse sequences of the brain and surrounding structures were obtained without and with intravenous contrast. Angiographic images of the neck were obtained using MRA technique with and without intravenous contrast. Carotid stenosis measurements (when applicable) are obtained utilizing NASCET criteria, using the distal internal carotid diameter as the denominator. CONTRAST:  31mL GADAVIST GADOBUTROL 1 MMOL/ML IV SOLN COMPARISON:  None. FINDINGS: MRI HEAD FINDINGS Brain: Multifocal abnormal diffusion restriction within the right hemisphere, predominantly within the anterior frontal lobe and the medial parietal lobe. No acute or chronic hemorrhage. There is multifocal hyperintense T2-weighted signal within the white matter. Parenchymal volume and CSF spaces are normal. There are old bilateral cerebellar infarcts. There is a right planum sphenoidale meningioma measuring  1.5 cm. Vascular: Major flow voids are preserved. Skull and upper cervical spine: Normal calvarium and skull base. Visualized upper cervical spine and soft tissues are normal. Sinuses/Orbits:No paranasal sinus fluid levels or advanced mucosal thickening. No mastoid or middle ear effusion. Normal orbits. MRA NECK FINDINGS Normal carotid and vertebral arteries. IMPRESSION: 1. Multifocal acute ischemia within the right hemisphere, predominantly within the anterior frontal lobe and medial parietal lobe. No hemorrhage or mass effect. 2. 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 3. Normal MRA of the neck. Electronically Signed   By: Ulyses Jarred M.D.   On: 05/27/2020 01:16   MR BRAIN W WO CONTRAST  Result Date: 05/27/2020 CLINICAL DATA:  Left leg numbness EXAM: MRI HEAD WITHOUT AND WITH CONTRAST MRA NECK WITHOUT AND WITH CONTRAST TECHNIQUE: Multiplanar, multiecho pulse sequences of the brain and surrounding structures were obtained without and with intravenous contrast. Angiographic images of the neck were obtained using MRA technique with and without intravenous contrast. Carotid stenosis measurements (when applicable) are obtained utilizing NASCET criteria, using the distal internal carotid diameter as the denominator. CONTRAST:  24mL GADAVIST GADOBUTROL 1 MMOL/ML IV SOLN COMPARISON:  None. FINDINGS: MRI HEAD FINDINGS Brain: Multifocal abnormal diffusion restriction within the right hemisphere, predominantly within the anterior frontal lobe and the medial parietal lobe. No acute or chronic hemorrhage. There is multifocal hyperintense T2-weighted signal within the white matter. Parenchymal volume and CSF spaces are normal. There are old bilateral cerebellar infarcts. 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 Vascular: Major flow voids are preserved. Skull and upper cervical spine: Normal calvarium and skull base. Visualized upper cervical spine and soft tissues are normal.  Sinuses/Orbits:No paranasal sinus fluid levels or advanced mucosal thickening. No mastoid or middle ear effusion. Normal orbits. MRA NECK FINDINGS Normal carotid and vertebral arteries. IMPRESSION: 1. Multifocal acute ischemia within the right hemisphere, predominantly within the anterior frontal lobe and medial parietal lobe. No hemorrhage or mass effect. 2. 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 3. Normal MRA of the neck. Electronically Signed   By: Ulyses Jarred M.D.   On: 05/27/2020 01:16   ECHOCARDIOGRAM COMPLETE  Result Date: 05/27/2020    ECHOCARDIOGRAM REPORT   Patient Name:   EVELYNNE SPIERS Date of Exam: 05/27/2020 Medical Rec #:  [id number]      Height:       64.5 in Accession #:    [id number]     Weight:       155.0 lb Date of Birth:  April 11, 1941  BSA:          1.765 m Patient Age:    53 years       BP:           155/75 mmHg Patient Gender: F              HR:           67 bpm. Exam Location:  Inpatient Procedure: 2D Echo Indications:    stroke 434.91  History:        Patient has no prior history of Echocardiogram examinations.  Sonographer:    Johny Chess Referring Phys: [id number] OLADAPO ADEFESO IMPRESSIONS  1. Left ventricular ejection fraction, by estimation, is 60 to 65%. The left ventricle has normal function. The left ventricle has no regional wall motion abnormalities. Left ventricular diastolic parameters are consistent with Grade I diastolic dysfunction (impaired relaxation).  2. Right ventricular systolic function is normal. The right ventricular size is normal. There is normal pulmonary artery systolic pressure.  3. The mitral valve is normal in structure. Trivial mitral valve regurgitation.  4. The aortic valve is tricuspid. There is mild calcification of the aortic valve. There is mild thickening of the aortic valve. Aortic valve regurgitation is not visualized. Mild aortic valve sclerosis is present, with no evidence of aortic valve stenosis.  5. The inferior vena  cava is normal in size with greater than 50% respiratory variability, suggesting right atrial pressure of 3 mmHg. Comparison(s): No prior Echocardiogram. Conclusion(s)/Recommendation(s): No intracardiac source of embolism detected on this transthoracic study. A transesophageal echocardiogram is recommended to exclude cardiac source of embolism if clinically indicated. FINDINGS  Left Ventricle: Left ventricular ejection fraction, by estimation, is 60 to 65%. The left ventricle has normal function. The left ventricle has no regional wall motion abnormalities. The left ventricular internal cavity size was normal in size. There is  borderline concentric left ventricular hypertrophy. Left ventricular diastolic parameters are consistent with Grade I diastolic dysfunction (impaired relaxation). Right Ventricle: The right ventricular size is normal. No increase in right ventricular wall thickness. Right ventricular systolic function is normal. There is normal pulmonary artery systolic pressure. The tricuspid regurgitant velocity is 1.98 m/s, and  with an assumed right atrial pressure of 3 mmHg, the estimated right ventricular systolic pressure is 42.6 mmHg. Left Atrium: Left atrial size was normal in size. Right Atrium: Right atrial size was normal in size. Pericardium: There is no evidence of pericardial effusion. Mitral Valve: The mitral valve is normal in structure. There is mild thickening of the mitral valve leaflet(s). Mild mitral annular calcification. Trivial mitral valve regurgitation. Tricuspid Valve: The tricuspid valve is normal in structure. Tricuspid valve regurgitation is trivial. Aortic Valve: The aortic valve is tricuspid. There is mild calcification of the aortic valve. There is mild thickening of the aortic valve. There is mild aortic valve annular calcification. Aortic valve regurgitation is not visualized. Mild aortic valve sclerosis is present, with no evidence of aortic valve stenosis. Pulmonic Valve:  The pulmonic valve was normal in structure. Pulmonic valve regurgitation is not visualized. Aorta: The aortic root is normal in size and structure. Venous: The inferior vena cava is normal in size with greater than 50% respiratory variability, suggesting right atrial pressure of 3 mmHg. IAS/Shunts: No atrial level shunt detected by color flow Doppler.  LEFT VENTRICLE PLAX 2D LVIDd:         3.60 cm Diastology LVIDs:         2.10 cm LV e' medial:  6.09 cm/s LV PW:         1.00 cm LV E/e' medial:  13.9 LV IVS:        1.00 cm LV e' lateral:   8.16 cm/s                        LV E/e' lateral: 10.4  RIGHT VENTRICLE             IVC RV S prime:     17.80 cm/s  IVC diam: 1.30 cm TAPSE (M-mode): 2.3 cm LEFT ATRIUM             Index       RIGHT ATRIUM          Index LA diam:        3.40 cm 1.93 cm/m  RA Area:     9.99 cm LA Vol (A2C):   47.9 ml 27.14 ml/m RA Volume:   19.60 ml 11.11 ml/m LA Vol (A4C):   41.7 ml 23.63 ml/m LA Biplane Vol: 45.0 ml 25.50 ml/m  AORTIC VALVE LVOT Vmax:   93.80 cm/s LVOT Vmean:  62.700 cm/s LVOT VTI:    0.224 m MITRAL VALVE                TRICUSPID VALVE MV Area (PHT): 2.45 cm     TR Peak grad:   15.7 mmHg MV Decel Time: 310 msec     TR Vmax:        198.00 cm/s MV E velocity: 84.60 cm/s MV A velocity: 104.00 cm/s  SHUNTS MV E/A ratio:  0.81         Systemic VTI: 0.22 m Gwyndolyn Kaufman MD Electronically signed by Gwyndolyn Kaufman MD Signature Date/Time: 05/27/2020/11:11:34 AM    Final    VAS [place] LOWER EXTREMITY VENOUS (DVT)  Result Date: 05/27/2020  Lower Venous DVT Study Indications: Stroke.  Comparison Study: no prior Performing Technologist: Abram Sander RVS  Examination Guidelines: A complete evaluation includes B-mode imaging, spectral Doppler, color Doppler, and power Doppler as needed of all accessible portions of each vessel. Bilateral testing is considered an integral part of a complete examination. Limited examinations for reoccurring indications may be performed as noted.  The reflux portion of the exam is performed with the patient in reverse Trendelenburg.  +---------+---------------+---------+-----------+----------+--------------+ RIGHT    CompressibilityPhasicitySpontaneityPropertiesThrombus Aging +---------+---------------+---------+-----------+----------+--------------+ CFV      Full           Yes      Yes                                 +---------+---------------+---------+-----------+----------+--------------+ SFJ      Full                                                        +---------+---------------+---------+-----------+----------+--------------+ FV Prox  Full                                                        +---------+---------------+---------+-----------+----------+--------------+ FV Mid   Full                                                        +---------+---------------+---------+-----------+----------+--------------+  FV DistalFull                                                        +---------+---------------+---------+-----------+----------+--------------+ PFV      Full                                                        +---------+---------------+---------+-----------+----------+--------------+ POP      Full           Yes      Yes                                 +---------+---------------+---------+-----------+----------+--------------+ PTV      Full                                                        +---------+---------------+---------+-----------+----------+--------------+ PERO     Full                                                        +---------+---------------+---------+-----------+----------+--------------+   +---------+---------------+---------+-----------+----------+--------------+ LEFT     CompressibilityPhasicitySpontaneityPropertiesThrombus Aging +---------+---------------+---------+-----------+----------+--------------+ CFV      Full           Yes       Yes                                 +---------+---------------+---------+-----------+----------+--------------+ SFJ      Full                                                        +---------+---------------+---------+-----------+----------+--------------+ FV Prox  Full                                                        +---------+---------------+---------+-----------+----------+--------------+ FV Mid   Full                                                        +---------+---------------+---------+-----------+----------+--------------+ FV DistalFull                                                        +---------+---------------+---------+-----------+----------+--------------+  PFV      Full                                                        +---------+---------------+---------+-----------+----------+--------------+ POP      Full           Yes      Yes                                 +---------+---------------+---------+-----------+----------+--------------+ PTV      Full                                                        +---------+---------------+---------+-----------+----------+--------------+ PERO     Full                                                        +---------+---------------+---------+-----------+----------+--------------+     Summary: BILATERAL: - No evidence of deep vein thrombosis seen in the lower extremities, bilaterally. - No evidence of superficial venous thrombosis in the lower extremities, bilaterally. -No evidence of popliteal cyst, bilaterally.   *See table(s) above for measurements and observations. Electronically signed by Deitra Mayo MD on 05/27/2020 at 1:48:00 PM.    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 Does the need for close, 24 hr/day medical supervision in concert with the patient's rehab needs make it unreasonable for this patient to be  served in a less intensive setting? Yes 2. Co-Morbidities requiring supervision/potential complications: Left triquetral fracture 3. Due to bladder management, bowel management, safety, skin/wound care, disease management, medication administration, pain management and patient education, does the patient require 24 hr/day rehab nursing? Yes 4. Does the patient require coordinated care of a physician, rehab nurse, therapy disciplines of PT, OT, SLP to address physical and functional deficits in the context of the above medical diagnosis(es)? Yes Addressing deficits in the following areas: balance, endurance, locomotion, strength, transferring, bowel/bladder control, bathing, dressing, toileting, cognition and psychosocial support 5. Can the patient actively participate in an intensive therapy program of at least 3 hrs of therapy per day at least 5 days per week? Yes 6. The potential for patient to make measurable gains while on inpatient rehab is good 7. Anticipated functional outcomes upon discharge from inpatient rehab are min assist  with PT, min assist with OT, modified independent with SLP. 8. Estimated rehab length of stay to reach the above functional goals is: 18-21d 9. Anticipated discharge destination: Home 10. Overall Rehab/Functional Prognosis: good  RECOMMENDATIONS: This patient's condition is appropriate for continued rehabilitative care in the following setting: CIR Patient has agreed to participate in recommended program. Yes Note that insurance prior authorization may be required for reimbursement for recommended care.  Comment: needs to complete acute care w/u '  Pamela S Love, PA-C 05/28/2020

## 2020-05-28 NOTE — Progress Notes (Signed)
Physical Therapy Treatment Patient Details Name: [name] MRN: [id number] DOB: Feb 19, 1941 Today's Date: 05/28/2020    History of Present Illness Pt is an 79 y/o female admitted following a fall and LLE numbness and weakness. Found to have L triquestral alvusion fx and is s/p splinting. 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 PMH includes kidney cancer.    PT Comments    Pt tolerates treatment well with progression to transfer and gait training. Pt noted to have improved activation of LLE this session, although demonstrating L foot drag and ankle inversion during gait which greatly increases her falls risk. PT reinforces HEP to continue improving LLE strength. Pt will benefit from continued acute PT POC to improve stability during OOB activity and to reduce falls risk. PT continues to recommend CIR placement at this time.   Follow Up Recommendations  CIR     Equipment Recommendations  Wheelchair (measurements PT);Wheelchair cushion (measurements PT) (if home today)    Recommendations for Other Services       Precautions / Restrictions Precautions Precautions: Fall Restrictions Weight Bearing Restrictions: Yes LUE Weight Bearing: Weight bear through elbow only    Mobility  Bed Mobility Overal bed mobility: Needs Assistance Bed Mobility: Supine to Sit     Supine to sit: Max assist        Transfers Overall transfer level: Needs assistance Equipment used: 2 person hand held assist Transfers: Sit to/from [facility] Sit to Stand: Mod assist;+2 physical assistance Stand pivot transfers: Mod assist;+2 physical assistance          Ambulation/Gait Ambulation/Gait assistance: Mod assist;+2 physical assistance Gait Distance (Feet): 12 Feet Assistive device: 2 person hand held assist Gait Pattern/deviations: Step-to pattern;Decreased step length - left;Decreased dorsiflexion - left;Decreased weight shift to left (L  ankle inversion) Gait velocity: reduced Gait velocity interpretation: <1.31 ft/sec, indicative of household ambulator General Gait Details: pt with short step-to gait, reduced step length bilaterally with reduced foot clearance of L foot during swing phase. Pt also with L ankle inversion throughout swing phase   Stairs             Wheelchair Mobility    Modified Rankin (Stroke Patients Only) Modified Rankin (Stroke Patients Only) Pre-Morbid Rankin Score: No symptoms Modified Rankin: Moderately severe disability     Balance Overall balance assessment: Needs assistance Sitting-balance support: Single extremity supported;Bilateral upper extremity supported;Feet supported Sitting balance-Leahy Scale: Poor Sitting balance - Comments: min-modA due to posterior lean   Standing balance support: Single extremity supported;Bilateral upper extremity supported Standing balance-Leahy Scale: Poor Standing balance comment: min-modA due to posterior lean                            Cognition Arousal/Alertness: Awake/alert Behavior During Therapy: WFL for tasks assessed/performed Overall Cognitive Status: Impaired/Different from baseline Area of Impairment: Attention;Memory;Safety/judgement;Awareness;Problem solving                   Current Attention Level: Sustained Memory: Decreased recall of precautions   Safety/Judgement: Decreased awareness of deficits Awareness: Emergent Problem Solving: Slow processing;Difficulty sequencing        Exercises      General Comments General comments (skin integrity, edema, etc.): pt reports mild nausea, intermittent dizziness during session, noted to be diaphoretic once in recliner. BP taken 989/21, previous systolic BP in 194R. Pt left in recliner with feet elevated, RN made aware of systolic BP change  Pertinent Vitals/Pain Pain Assessment: Faces Faces Pain Scale: Hurts little more Pain Location: back and L  wrist Pain Descriptors / Indicators: Grimacing Pain Intervention(s): Monitored during session    Home Living     Available Help at Discharge: Family Type of Home: House              Prior Function            PT Goals (current goals can now be found in the care plan section) Acute Rehab PT Goals Patient Stated Goal: to go home Progress towards PT goals: Progressing toward goals    Frequency    Min 4X/week      PT Plan Current plan remains appropriate    Co-evaluation PT/OT/SLP Co-Evaluation/Treatment: Yes Reason for Co-Treatment: Complexity of the patient's impairments (multi-system involvement);For patient/therapist safety;To address functional/ADL transfers PT goals addressed during session: Mobility/safety with mobility;Balance;Strengthening/ROM        AM-PAC PT "6 Clicks" Mobility   Outcome Measure  Help needed turning from your back to your side while in a flat bed without using bedrails?: A Lot Help needed moving from lying on your back to sitting on the side of a flat bed without using bedrails?: A Lot Help needed moving to and from a bed to a chair (including a wheelchair)?: A Lot Help needed standing up from a chair using your arms (e.g., wheelchair or bedside chair)?: A Lot Help needed to walk in hospital room?: A Lot Help needed climbing 3-5 steps with a railing? : Total 6 Click Score: 11    End of Session   Activity Tolerance: Patient tolerated treatment well Patient left: in chair;with call bell/phone within reach;with chair alarm set;with family/visitor present Nurse Communication: Mobility status PT Visit Diagnosis: Unsteadiness on feet (R26.81);History of falling (Z91.81);Muscle weakness (generalized) (M62.81)     Time: 6213-0865 PT Time Calculation (min) (ACUTE ONLY): 44 min  Charges:  $Gait Training: 8-22 mins $Therapeutic Activity: 8-22 mins                     Zenaida Niece, PT, DPT Acute Rehabilitation Pager:  [phone]    Zenaida Niece 05/28/2020, 1:09 PM

## 2020-05-28 NOTE — Evaluation (Signed)
Speech Language Pathology Evaluation Patient Details Name: [name] MRN: [id number] DOB: January 10, 1941 Today's Date: 05/28/2020 Time: 1443-1540 SLP Time Calculation (min) (ACUTE ONLY): 13 min  Problem List:  Patient Active Problem List   Diagnosis Date Noted  . Cerebral ischemia 05/27/2020  . Left leg weakness 05/27/2020  . Triquetral fracture 05/27/2020  . Acute CVA (cerebrovascular accident) ([place]) 05/26/2020  . Left leg numbness 05/26/2020  . GERD (gastroesophageal reflux disease) 05/26/2020  . 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 Posterior vitreous detachment of both eyes 04/29/2020  . 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 Left epiretinal membrane 04/29/2020  . Pain in right knee 05/26/2018  . Bilateral lower abdominal pain 10/21/2010  . History of diverticulitis of colon 10/21/2010  . History of Clostridium difficile infection 10/21/2010   Past Medical History:  Past Medical History:  Diagnosis Date  . Blood transfusion without reported diagnosis    with hip fracture-79 years old  . Cancer [facility]) 1983   Kidney Cancer  . Diverticulosis   . GERD (gastroesophageal reflux disease)   . Personal history of kidney cancer    Past Surgical History:  Past Surgical History:  Procedure Laterality Date  . ABDOMINAL HYSTERECTOMY    . COLON SURGERY     Severe sigmoid diverticulitis  . COLONOSCOPY    . FOOT SURGERY    . HIP SURGERY    . Kidney cancer     Kidney removed  . POLYPECTOMY     HPI:  Pt is an 79 y.o. female with medical history significant hip fracture at age of 20, chronic pain syndrome, kidney cancer s/p right nephrectomy (per patient) who presented to the ED due to 2 to 3-day onset of left leg numbness (from knee to foot).  MRI brain: Multifocal acute ischemia within the right hemisphere, predominantly within the anterior frontal lobe and medial parietal lobe. No hemorrhage or mass  effect.   Assessment / Plan / Recommendation Clinical Impression  Pt participated in speech/language/cognition evaluation. Pt denied any baseline deficits in speech, language, or cognition and reported that she independently managed her medications and finances prior to admission. [facility] Mental Status Examination was completed to evaluate the pt's cognitive-linguistic skills. She achieved a score of 20/30 which is below the normal limits of 27 or more out of 30. She exhibited difficulty in the areas of memory and executive function and additional processing time was intermittently required for more complex tasks. Her speech and language skills were WNL. Skilled SLP services are clinically indicated at this time to improve cognitive-linguistic function.    SLP Assessment  SLP Recommendation/Assessment: Patient needs continued Speech Lanaguage Pathology Services SLP Visit Diagnosis: Cognitive communication deficit (R41.841)    Follow Up Recommendations  Inpatient Rehab    Frequency and Duration min 2x/week  2 weeks      SLP Evaluation Cognition  Overall Cognitive Status: Impaired/Different from baseline Arousal/Alertness: Awake/alert Orientation Level: Oriented to person;Oriented to place;Oriented to situation (Date provided: May 25, 2020) Attention: Focused;Sustained Focused Attention: Appears intact Sustained Attention: Appears intact Memory: Impaired Memory Impairment: Retrieval deficit;Decreased recall of new information (Immediate: 5/5; Delayed: 4/5; with cues: 1/1) Awareness: Appears intact Problem Solving: Appears intact Executive Function:  (Clock drawing:L 0/4) Organizing: Impaired Organizing Impairment: Verbal complex (Backward digit span: 1/2)       Comprehension  Auditory Comprehension Overall Auditory Comprehension: Appears within functional limits for tasks assessed Yes/No Questions: Within Functional Limits  Commands: Within Functional  Limits Conversation: Complex    Expression Expression Primary Mode of Expression: Verbal Verbal Expression Overall Verbal Expression: Appears within functional limits for tasks assessed Initiation: No impairment Level of Generative/Spontaneous Verbalization: Conversation Repetition: No impairment Naming: No impairment Pragmatics: No impairment Written Expression Dominant Hand: Right   Oral / Motor  Oral Motor/Sensory Function Overall Oral Motor/Sensory Function: Within functional limits Motor Speech Overall Motor Speech: Appears within functional limits for tasks assessed Respiration: Within functional limits Phonation: Normal Resonance: Within functional limits Articulation: Within functional limitis Intelligibility: Intelligible Motor Planning: Witnin functional limits Motor Speech Errors: Not applicable   Emili Mcloughlin I. Hardin Negus, [place], [place] Office number [phone] Pager [place] 05/28/2020, 10:58 AM

## 2020-05-28 NOTE — Progress Notes (Signed)
SLP Cancellation Note  Patient Details Name: [name] MRN: [id number] DOB: [birth year]   Cancelled treatment:       Reason Eval/Treat Not Completed: Patient at procedure or test/unavailable (Per radiology tech, transport arrived for pt, but she was in the chair and RN could not get the pt back to bed. This delay posted a schedule conflict for today and the study will therefore be deferred for 12/16.)  Caily Rakers I. Hardin Negus, [place], [place] Office number [phone] Pager [place] 05/28/2020, 1:42 PM

## 2020-05-28 NOTE — Evaluation (Signed)
Occupational Therapy Evaluation Patient Details Name: [name] MRN: [id number] DOB: [birth year] Today's Date: 05/28/2020    History of Present Illness Pt is an 79 y/o female admitted following a fall and LLE numbness and weakness. Found to have L triquestral alvusion fx and is s/p splinting. 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 PMH includes kidney cancer.   Clinical Impression   PT admitted with Frontal and medial parietal lobe infarcts. Pt currently with functional limitiations due to the deficits listed below (see OT problem list). Pt currently with L side weakness affecting all adls. Pt with balance deficits and L ankle weakness with basic transfers. Recommendation for tennis shoes to attempt to give more ankle support then current slide on shoes. Pt with L LE built up shoe due to leg length difference ( 1-1.5 inches difference)  Pt will benefit from skilled OT to increase their independence and safety with adls and balance to allow discharge CIR.     Follow Up Recommendations  CIR    Equipment Recommendations  Other (comment);3 in 1 bedside commode (platform walker)    Recommendations for Other Services Rehab consult     Precautions / Restrictions Precautions Precautions: Fall Restrictions Weight Bearing Restrictions: Yes LUE Weight Bearing: Weight bear through elbow only      Mobility Bed Mobility Overal bed mobility: Needs Assistance Bed Mobility: Supine to Sit     Supine to sit: Max assist          Transfers Overall transfer level: Needs assistance Equipment used: 2 person hand held assist Transfers: Sit to/from [facility] Sit to Stand: Mod assist;+2 physical assistance Stand pivot transfers: Mod assist;+2 physical assistance            Balance Overall balance assessment: Needs assistance Sitting-balance support: Single extremity supported;Bilateral upper extremity supported;Feet  supported Sitting balance-Leahy Scale: Poor Sitting balance - Comments: min-modA due to posterior lean   Standing balance support: Single extremity supported;Bilateral upper extremity supported Standing balance-Leahy Scale: Poor Standing balance comment: min-modA due to posterior lean                           ADL either performed or assessed with clinical judgement   ADL Overall ADL's : Needs assistance/impaired Eating/Feeding: Set up;Sitting       Upper Body Bathing: Minimal assistance   Lower Body Bathing: Maximal assistance   Upper Body Dressing : Minimal assistance       Toilet Transfer: +2 for physical assistance;Moderate assistance   Toileting- Clothing Manipulation and Hygiene: Maximal assistance       Functional mobility during ADLs: +2 for physical assistance;Moderate assistance General ADL Comments: pt reports dizziness after movement but not during. pt delayed in providing information at times during session. pt noted to have BP slightly lower than documented in chart.     Vision Baseline Vision/History: Wears glasses Vision Assessment?: No apparent visual deficits     Perception     Praxis      Pertinent Vitals/Pain Pain Assessment: Faces Faces Pain Scale: Hurts little more Pain Location: back and L wrist Pain Descriptors / Indicators: Grimacing Pain Intervention(s): Monitored during session;Repositioned     Hand Dominance Right   Extremity/Trunk Assessment Upper Extremity Assessment Upper Extremity Assessment: LUE deficits/detail LUE Deficits / Details: splint MCP to forearm at this time. Daughter asking questions about taking it off. Pt and daughter educated splint to remain per orders at thistime  Lower Extremity Assessment Lower Extremity Assessment: Defer to PT evaluation;LLE deficits/detail LLE Deficits / Details: see PT notes- recommended tennis shoe due to ankle weakness and prevent lateral rolling of ankle-lateral side of  knee area reports numbness   Cervical / Trunk Assessment Cervical / Trunk Assessment: Normal   Communication Communication Communication: No difficulties   Cognition Arousal/Alertness: Awake/alert Behavior During Therapy: WFL for tasks assessed/performed Overall Cognitive Status: Impaired/Different from baseline Area of Impairment: Attention;Memory;Safety/judgement;Awareness;Problem solving                   Current Attention Level: Sustained Memory: Decreased recall of precautions   Safety/Judgement: Decreased awareness of deficits Awareness: Emergent Problem Solving: Slow processing;Difficulty sequencing General Comments: Pt able to give details of events   General Comments  BP taken 716/96, previous systolic BP in 789F. Pt left in recliner with feet elevated, RN made aware of systolic BP change    Exercises     Shoulder Instructions      Home Living Family/patient expects to be discharged to:: Inpatient rehab                                 Additional Comments: daughter currently present from [place] and will give care through holidays as needed. Daughter works as a Pharmacist, hospital. spouse is 21 yo and requires some assistance from daugther as well      Prior Functioning/Environment Level of Independence: Independent                 OT Problem List: Decreased strength;Decreased activity tolerance;Impaired balance (sitting and/or standing);Decreased cognition;Decreased safety awareness;Decreased knowledge of use of DME or AE;Decreased knowledge of precautions;Impaired sensation;Cardiopulmonary status limiting activity      OT Treatment/Interventions: Self-care/ADL training;Therapeutic exercise;Neuromuscular education;Energy conservation;DME and/or AE instruction;Manual therapy;Modalities;Therapeutic activities;Cognitive remediation/compensation;Patient/family education;Balance training    OT Goals(Current goals can be found in the care plan section)  Acute Rehab OT Goals Patient Stated Goal: to go home OT Goal Formulation: With patient/family Time For Goal Achievement: 06/11/20 Potential to Achieve Goals: Good  OT Frequency: Min 2X/week   Barriers to D/C:            Co-evaluation PT/OT/SLP Co-Evaluation/Treatment: Yes Reason for Co-Treatment: Complexity of the patient's impairments (multi-system involvement);For patient/therapist safety;To address functional/ADL transfers PT goals addressed during session: Mobility/safety with mobility;Balance;Strengthening/ROM OT goals addressed during session: ADL's and self-care;Proper use of Adaptive equipment and DME;Strengthening/ROM      AM-PAC OT "6 Clicks" Daily Activity     Outcome Measure Help from another person eating meals?: A Little Help from another person taking care of personal grooming?: A Little Help from another person toileting, which includes using toliet, bedpan, or urinal?: A Lot Help from another person bathing (including washing, rinsing, drying)?: A Lot Help from another person to put on and taking off regular upper body clothing?: A Lot Help from another person to put on and taking off regular lower body clothing?: A Lot 6 Click Score: 14   End of Session Equipment Utilized During Treatment: Gait belt Nurse Communication: Mobility status;Precautions  Activity Tolerance: Patient tolerated treatment well Patient left: in chair;with call bell/phone within reach;with family/visitor present;with chair alarm set  OT Visit Diagnosis: Unsteadiness on feet (R26.81);Muscle weakness (generalized) (M62.81)                Time: 8101-7510 OT Time Calculation (min): 44 min Charges:  OT General Charges $OT Visit: 1 Visit OT Evaluation $OT  Eval Moderate Complexity: 1 Mod   Brynn, OTR/L  Acute Rehabilitation Services Pager: [phone] Office: [phone] .   Jeri Modena 05/28/2020, 4:11 PM

## 2020-05-29 ENCOUNTER — Inpatient Hospital Stay (HOSPITAL_COMMUNITY): Payer: PPO

## 2020-05-29 DIAGNOSIS — E7800 Pure hypercholesterolemia, unspecified: Secondary | ICD-10-CM

## 2020-05-29 LAB — CBC WITH DIFFERENTIAL/PLATELET
Abs Immature Granulocytes: 0.02 10*3/uL (ref 0.00–0.07)
Basophils Absolute: 0 10*3/uL (ref 0.0–0.1)
Basophils Relative: 0 %
Eosinophils Absolute: 0.1 10*3/uL (ref 0.0–0.5)
Eosinophils Relative: 1 %
HCT: 40.1 % (ref 36.0–46.0)
Hemoglobin: 12.9 g/dL (ref 12.0–15.0)
Immature Granulocytes: 0 %
Lymphocytes Relative: 25 %
Lymphs Abs: 1.7 10*3/uL (ref 0.7–4.0)
MCH: 29.4 pg (ref 26.0–34.0)
MCHC: 32.2 g/dL (ref 30.0–36.0)
MCV: 91.3 fL (ref 80.0–100.0)
Monocytes Absolute: 1 10*3/uL (ref 0.1–1.0)
Monocytes Relative: 14 %
Neutro Abs: 4.1 10*3/uL (ref 1.7–7.7)
Neutrophils Relative %: 60 %
Platelets: 297 10*3/uL (ref 150–400)
RBC: 4.39 MIL/uL (ref 3.87–5.11)
RDW: 12.6 % (ref 11.5–15.5)
WBC: 6.9 10*3/uL (ref 4.0–10.5)
nRBC: 0 % (ref 0.0–0.2)

## 2020-05-29 LAB — COMPREHENSIVE METABOLIC PANEL
ALT: 14 U/L (ref 0–44)
AST: 18 U/L (ref 15–41)
Albumin: 3 g/dL — ABNORMAL LOW (ref 3.5–5.0)
Alkaline Phosphatase: 60 U/L (ref 38–126)
Anion gap: 9 (ref 5–15)
BUN: 13 mg/dL (ref 8–23)
CO2: 26 mmol/L (ref 22–32)
Calcium: 8.9 mg/dL (ref 8.9–10.3)
Chloride: 103 mmol/L (ref 98–111)
Creatinine, Ser: 0.93 mg/dL (ref 0.44–1.00)
GFR, Estimated: >60 mL/min (ref >60)
Glucose, Bld: 112 mg/dL — ABNORMAL HIGH (ref 70–99)
Potassium: 3.8 mmol/L (ref 3.5–5.1)
Sodium: 138 mmol/L (ref 135–145)
Total Bilirubin: 0.8 mg/dL (ref 0.3–1.2)
Total Protein: 6 g/dL — ABNORMAL LOW (ref 6.5–8.1)

## 2020-05-29 LAB — MAGNESIUM: Magnesium: 2.1 mg/dL (ref 1.7–2.4)

## 2020-05-29 MED ORDER — METHYLPREDNISOLONE ACETATE 40 MG/ML IJ SUSP
40.0000 mg | Freq: Once | INTRAMUSCULAR | Status: DC
  Filled 2020-05-29 (×2): qty 1

## 2020-05-29 MED ORDER — ONDANSETRON HCL 4 MG/2ML IJ SOLN: 4.0000 mg | Freq: Four times a day (QID) | INTRAMUSCULAR | Status: DC | PRN

## 2020-05-29 MED ORDER — BUPIVACAINE HCL (PF) 0.5 % IJ SOLN
10.0000 mL | Freq: Once | INTRAMUSCULAR | Status: DC
  Filled 2020-05-29 (×2): qty 10

## 2020-05-29 NOTE — Progress Notes (Signed)
Modified Barium Swallow Progress Note  Patient Details  Name: [name] MRN: [id number] Date of Birth: Apr 08, 1941  Today's Date: 05/29/2020  Modified Barium Swallow completed.  Full report located under Chart Review in the Imaging Section.  Brief recommendations include the following:  Clinical Impression  Pt was seen in radiology suite for modified barium swallow study. Trials of puree solids, regular texture solids, a 15mm barium tablet, and thin liquids via cup and straw were administered. Pt's oropharyngeal swallow mechanism was within normal limits. Throat clearing and a single cough were intermittently noted during the study; however, no instances of penetration or aspiration were demonstrated. It is recommended that a regular texture diet with thin liquids be continued at this time. Further skilled SLP services are not clinically indicated for swallowing, but SLP will follow for cognitive-linguistic treatment.   Swallow Evaluation Recommendations       SLP Diet Recommendations: Regular solids;Thin liquid   Liquid Administration via: Cup;Straw   Medication Administration: Whole meds with liquid   Supervision: Staff to assist with self feeding       Postural Changes: Seated upright at 90 degrees   Oral Care Recommendations: Oral care BID      Syriah Delisi I. Hardin Negus, [place], [place] Office number [phone] Pager [place] 05/29/2020,9:49 AM

## 2020-05-29 NOTE — Progress Notes (Signed)
Physical Therapy Treatment Patient Details Name: [name] MRN: [id number] DOB: [birth year] Today's Date: 05/29/2020    History of Present Illness Pt is an 79 y/o female admitted following a fall and LLE numbness and weakness. Found to have L triquestral alvusion fx and is s/p splinting. 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 PMH includes kidney cancer.    PT Comments    Pt tolerates treatment well with improved transfer quality and gait tolerance. Pt with improved control of ankle inversion this session without rolling of L ankle. Pt does continue to demonstrate foot drag of L foot with fatigue. Pt continues to require assistance for transfers due to LE weakness and also requires frequent cues to maintain NWB through L wrist. Pt will continue to benefit from aggressive mobilization and PT POC to reduce falls risk and aide in restoring independence. PT continues to recommend CIR placement at this time.   Follow Up Recommendations  CIR     Equipment Recommendations  Other (comment) (L platform walker)    Recommendations for Other Services       Precautions / Restrictions Precautions Precautions: Fall Restrictions Weight Bearing Restrictions: Yes LUE Weight Bearing: Weight bear through elbow only Other Position/Activity Restrictions: Assumed NWB on L wrist    Mobility  Bed Mobility Overal bed mobility: Needs Assistance Bed Mobility: Supine to Sit     Supine to sit: Mod assist        Transfers Overall transfer level: Needs assistance Equipment used: Left platform walker Transfers: Sit to/from Stand Sit to Stand: Min assist            Ambulation/Gait Ambulation/Gait assistance: Min assist Gait Distance (Feet): 80 Feet Assistive device: Left platform walker Gait Pattern/deviations: Step-to pattern Gait velocity: reduced Gait velocity interpretation: <1.31 ft/sec, indicative of household ambulator General Gait Details:  pt with short step-to gait, intermittent L foot drag which is exacerbated by fatigue   Stairs             Wheelchair Mobility    Modified Rankin (Stroke Patients Only) Modified Rankin (Stroke Patients Only) Pre-Morbid Rankin Score: No symptoms Modified Rankin: Moderately severe disability     Balance Overall balance assessment: Needs assistance Sitting-balance support: No upper extremity supported;Feet supported Sitting balance-Leahy Scale: Fair     Standing balance support: Bilateral upper extremity supported Standing balance-Leahy Scale: Poor Standing balance comment: minA initially with BUE support of platform walker                            Cognition Arousal/Alertness: Awake/alert Behavior During Therapy: WFL for tasks assessed/performed Overall Cognitive Status: Impaired/Different from baseline Area of Impairment: Safety/judgement;Awareness;Memory                     Memory: Decreased recall of precautions   Safety/Judgement: Decreased awareness of deficits;Decreased awareness of safety Awareness: Emergent          Exercises      General Comments General comments (skin integrity, edema, etc.): VSS on RA      Pertinent Vitals/Pain Pain Assessment: Faces Faces Pain Scale: Hurts even more Pain Location: IV site in L wrist Pain Descriptors / Indicators: Grimacing Pain Intervention(s): Monitored during session    Home Living                      Prior Function  PT Goals (current goals can now be found in the care plan section) Acute Rehab PT Goals Patient Stated Goal: to go home Progress towards PT goals: Progressing toward goals    Frequency    Min 4X/week      PT Plan Current plan remains appropriate    Co-evaluation              AM-PAC PT "6 Clicks" Mobility   Outcome Measure  Help needed turning from your back to your side while in a flat bed without using bedrails?: A Little Help  needed moving from lying on your back to sitting on the side of a flat bed without using bedrails?: A Lot Help needed moving to and from a bed to a chair (including a wheelchair)?: A Little Help needed standing up from a chair using your arms (e.g., wheelchair or bedside chair)?: A Little Help needed to walk in hospital room?: A Little Help needed climbing 3-5 steps with a railing? : A Lot 6 Click Score: 16    End of Session   Activity Tolerance: Patient tolerated treatment well Patient left: in chair;with call bell/phone within reach;with chair alarm set;with family/visitor present Nurse Communication: Mobility status PT Visit Diagnosis: Unsteadiness on feet (R26.81);History of falling (Z91.81);Muscle weakness (generalized) (M62.81)     Time: 8184-0375 PT Time Calculation (min) (ACUTE ONLY): 34 min  Charges:  $Gait Training: 8-22 mins $Therapeutic Activity: 8-22 mins                     Zenaida Niece, PT, DPT Acute Rehabilitation Pager: [phone]    Zenaida Niece 05/29/2020, 12:25 PM

## 2020-05-29 NOTE — Progress Notes (Signed)
[facility] Progress Note  Patient: [name]    KGY:[id number]  DOA: 05/26/2020     Date of Service: the patient was seen and examined on 05/29/2020  Brief hospital course: Past medical history of chronic pain syndrome, renal cell carcinoma SP right nephrectomy, bilateral rotator cuff injury presents with complaints of left leg numbness and a fall.  Found to have acute CVA and triquetral avulsion fracture. Currently plan is CIR arrangement.  Assessment and Plan: 1.  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 MRI brain shows multifocal ischemic lesions on the right hemisphere in the [place] territory. MRA shows right ACA occlusion. Echocardiogram shows 60 to 65% EF.  No emboli in the cardiac echocardiogram. Dopplers negative for DVT. SVT/PAC on telemetry so far, no A. fib. Loop recorder when the patient is close to discharge. LDL 143 not on any medication prior to admission. Currently on 81 mg aspirin daily +75 mg Plavix daily. DAPT for 3 months followed by aspirin alone. PT OT recommends CIR. Passed swallowing evaluation.  2.  Hyperlipidemia LDL 143. Currently on 40 mg daily Lipitor.  Continue.  3.  Fall Left wrist fracture Fall occurred secondary to stroke. X-ray shows triquetral avulsion fracture. Currently splint splint. Orthopedic recommends outpatient follow-up. Pain tolerable with Tylenol but frequency is not enough. We will add tramadol.  4.  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 Appreciate orthopedics for shoulder injection.  Diet: Cardiac diet DVT Prophylaxis:   enoxaparin (LOVENOX) injection 40 mg Start: 05/27/20 1715 SCDs Start: 05/27/20 0055    Advance goals of care discussion: Full code  Family Communication: family was present at bedside, at the time of interview.  The pt provided permission to  discuss medical plan with the family. Opportunity was given to ask question and all questions were answered satisfactorily.   Disposition:  Status is: Inpatient  Remains inpatient appropriate because:Ongoing diagnostic testing needed not appropriate for outpatient work up and Unsafe d/c plan  Dispo: The patient is from: Home              Anticipated d/c is to: CIR              Anticipated d/c date is: 1 day              Patient currently is medically stable to d/c.  Subjective: No nausea no vomiting.  No fever no chills.  No chest pain.  No abdominal pain.  Continues to have shoulder pain.  Physical Exam:  General: Appear in mild distress, no Rash; Oral Mucosa Clear, moist. no Abnormal Neck Mass Or lumps, Conjunctiva normal  Cardiovascular: S1 and S2 Present, no Murmur, Respiratory: good respiratory effort, Bilateral Air entry present and CTA, no Crackles, no wheezes Abdomen: Bowel Sound present, Soft and no tenderness Extremities: no Pedal edema Neurology: alert and oriented to time, place, and person affect appropriate. no new focal deficit Gait not checked due to patient safety concerns  Vitals:   05/29/20 0408 05/29/20 0848 05/29/20 1130 05/29/20 1543  BP: (!) 144/55 (!) 159/65 129/65 129/65  Pulse: 65 69 81 73  Resp: 18 18 18 18   Temp: 98.3 F (36.8 C) 97.8 F (36.6 C) 97.8 F (36.6 C) 98.7 F (37.1 C)  TempSrc: Oral Oral Oral Oral  SpO2: 97% 99% 95% 96%    Intake/Output Summary (Last 24 hours) at 05/29/2020 0263 Last data filed  at 05/29/2020 1634 Gross per 24 hour  Intake 880 ml  Output 200 ml  Net 680 ml   There were no vitals filed for this visit.  Data Reviewed: I have personally reviewed and interpreted daily labs, tele strips, imagings as discussed above. I reviewed all nursing notes, pharmacy notes, vitals, pertinent old records I have discussed plan of care as described above with RN and patient/family.  CBC: Recent Labs  Lab 05/26/20 1639  05/27/20 0240 05/29/20 0201  WBC 9.2 5.6 6.9  NEUTROABS 7.0  --  4.1  HGB 13.7 12.5 12.9  HCT 43.6 38.3 40.1  MCV 94.0 92.7 91.3  PLT 339 287 416   Basic Metabolic Panel: Recent Labs  Lab 05/26/20 1639 05/27/20 0240 05/29/20 0201  NA 138 138 138  K 4.3 3.7 3.8  CL 102 104 103  CO2 26 26 26   GLUCOSE 102* 93 112*  BUN 9 9 13   CREATININE 0.82 0.86 0.93  CALCIUM 9.3 9.0 8.9  MG  --  2.1 2.1  PHOS  --  3.2  --     Studies: DG Swallowing Func-Speech Pathology  Result Date: 05/29/2020 Objective Swallowing Evaluation: Type of Study: Bedside Swallow Evaluation  Patient Details Name: [name] MRN: [id number] Date of Birth: 07-Jun-1941 Today's Date: 05/29/2020 Time: SLP Start Time (ACUTE ONLY): 0930 -SLP Stop Time (ACUTE ONLY): 0945 SLP Time Calculation (min) (ACUTE ONLY): 15 min Past Medical History: Past Medical History: Diagnosis Date . Blood transfusion without reported diagnosis   with hip fracture-79 years old . Cancer [facility]) 1983  Kidney Cancer . Diverticulosis  . GERD (gastroesophageal reflux disease)  . Personal history of kidney cancer  Past Surgical History: Past Surgical History: Procedure Laterality Date . ABDOMINAL HYSTERECTOMY   . COLON SURGERY    Severe sigmoid diverticulitis . COLONOSCOPY   . FOOT SURGERY   . HIP SURGERY   . Kidney cancer    Kidney removed . POLYPECTOMY   HPI: Pt is an 79 y.o. female with medical history significant hip fracture at age of 43, chronic pain syndrome, kidney cancer s/p right nephrectomy (per patient) who presented to the ED due to 2 to 3-day onset of left leg numbness (from knee to foot).  MRI brain: Multifocal acute ischemia within the right hemisphere, predominantly within the anterior frontal lobe and medial parietal lobe. No hemorrhage or mass effect.  No data recorded Assessment / Plan / Recommendation CHL IP CLINICAL IMPRESSIONS 05/29/2020 Clinical Impression Pt was seen in radiology suite for modified barium swallow study. Trials of puree  solids, regular texture solids, a 40mm barium tablet, and thin liquids via cup and straw were administered. Pt's oropharyngeal swallow mechanism was within normal limits. Throat clearing and a single cough were intermittently noted during the study; however, no instances of penetration or aspiration were demonstrated. It is recommended that a regular texture diet with thin liquids be continued at this time. Further skilled SLP services are not clinically indicated for swallowing, but SLP will follow for cognitive-linguistic treatment. SLP Visit Diagnosis Dysphagia, unspecified (R13.10) Attention and concentration deficit following -- Frontal lobe and executive function deficit following -- Impact on safety and function No limitations   CHL IP TREATMENT RECOMMENDATION 05/29/2020 Treatment Recommendations No treatment recommended at this time   No flowsheet data found. CHL IP DIET RECOMMENDATION 05/29/2020 SLP Diet Recommendations Regular solids;Thin liquid Liquid Administration via Cup;Straw Medication Administration Whole meds with liquid Compensations -- Postural Changes Seated upright at 90 degrees   CHL IP  OTHER RECOMMENDATIONS 05/29/2020 Recommended Consults -- Oral Care Recommendations Oral care BID Other Recommendations --   CHL IP FOLLOW UP RECOMMENDATIONS 05/29/2020 Follow up Recommendations Inpatient Rehab   CHL IP FREQUENCY AND DURATION 05/29/2020 Speech Therapy Frequency (ACUTE ONLY) min 2x/week Treatment Duration 2 weeks      CHL IP ORAL PHASE 05/29/2020 Oral Phase WFL Oral - Pudding Teaspoon -- Oral - Pudding Cup -- Oral - Honey Teaspoon -- Oral - Honey Cup -- Oral - Nectar Teaspoon -- Oral - Nectar Cup -- Oral - Nectar Straw -- Oral - Thin Teaspoon -- Oral - Thin Cup -- Oral - Thin Straw -- Oral - Puree -- Oral - Mech Soft -- Oral - Regular -- Oral - Multi-Consistency -- Oral - Pill -- Oral Phase - Comment --  CHL IP PHARYNGEAL PHASE 05/29/2020 Pharyngeal Phase WFL Pharyngeal- Pudding Teaspoon --  Pharyngeal -- Pharyngeal- Pudding Cup -- Pharyngeal -- Pharyngeal- Honey Teaspoon -- Pharyngeal -- Pharyngeal- Honey Cup -- Pharyngeal -- Pharyngeal- Nectar Teaspoon -- Pharyngeal -- Pharyngeal- Nectar Cup -- Pharyngeal -- Pharyngeal- Nectar Straw -- Pharyngeal -- Pharyngeal- Thin Teaspoon -- Pharyngeal -- Pharyngeal- Thin Cup -- Pharyngeal -- Pharyngeal- Thin Straw -- Pharyngeal -- Pharyngeal- Puree -- Pharyngeal -- Pharyngeal- Mechanical Soft -- Pharyngeal -- Pharyngeal- Regular -- Pharyngeal -- Pharyngeal- Multi-consistency -- Pharyngeal -- Pharyngeal- Pill -- Pharyngeal -- Pharyngeal Comment --  CHL IP CERVICAL ESOPHAGEAL PHASE 05/29/2020 Cervical Esophageal Phase WFL Pudding Teaspoon -- Pudding Cup -- Honey Teaspoon -- Honey Cup -- Nectar Teaspoon -- Nectar Cup -- Nectar Straw -- Thin Teaspoon -- Thin Cup -- Thin Straw -- Puree -- Mechanical Soft -- Regular -- Multi-consistency -- Pill -- Cervical Esophageal Comment -- Shanika I. Hardin Negus, [place], [place] Office number [phone] Pager [place] 05/29/2020, 10:24 AM               Scheduled Meds: . aspirin EC  81 mg Oral Daily  . atorvastatin  40 mg Oral Daily  . bupivacaine  10 mL Infiltration Once  . clopidogrel  75 mg Oral Daily  . enoxaparin (LOVENOX) injection  40 mg Subcutaneous Q24H  . methylPREDNISolone acetate  40 mg Intra-articular Once  . pantoprazole  40 mg Oral Daily  . polyethylene glycol  17 g Oral Daily  . senna-docusate  1 tablet Oral BID   Continuous Infusions: PRN Meds: acetaminophen, ondansetron (ZOFRAN) IV, traMADol  Time spent: 35 minutes  Author: Berle Mull, MD [facility] 05/29/2020 7:17 PM  To reach On-call, see care teams to locate the attending and reach out via [email or web address]. Between 7PM-7AM, please contact night-coverage If you still have difficulty reaching the attending provider, please page the [facility] (Director on Call) for [facility] on amion  for assistance.

## 2020-05-29 NOTE — Progress Notes (Signed)
Occupational Therapy Treatment Patient Details Name: [name] MRN: [id number] DOB: [birth year] Today's Date: 05/29/2020    History of present illness Pt is an 79 y/o female admitted following a fall and LLE numbness and weakness. Found to have L triquestral alvusion fx and is s/p splinting. 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 PMH includes kidney cancer.   OT comments  Patient continues to make steady progress towards goals in skilled OT session. Patient's session encompassed stroke safety (emphasis on BE FAST) bed mobility and motor planning, reiteration of WB precautions, and exercises. Pt continues to require frequent cues to motor plan coming to the EOB, especially with WB precautions, requiring step by step instruction in order to complete x2. Pt able to demonstrate exercises seated EOB with demonstration and cues, with noted numbness and tingling in LLE heel and lateral side of ankle. Therapy continues to recommend CIR due to multi-system involvement and prior level of function; therapy will continue to follow.    Follow Up Recommendations  CIR    Equipment Recommendations  Other (comment);3 in 1 bedside commode (platform walker)    Recommendations for Other Services      Precautions / Restrictions Precautions Precautions: Fall Restrictions Weight Bearing Restrictions: Yes LUE Weight Bearing:  (weight bear through elbow only) Other Position/Activity Restrictions: Assumed NWB on L wrist       Mobility Bed Mobility Overal bed mobility: Needs Assistance Bed Mobility: Supine to Sit;Sit to Supine     Supine to sit: Mod assist Sit to supine: Mod assist   General bed mobility comments: increased time and cues in order to motor plan with weight bearing precautions  Transfers                      Balance Overall balance assessment: Needs assistance Sitting-balance support: No upper extremity supported;Feet  supported Sitting balance-Leahy Scale: Fair                                     ADL either performed or assessed with clinical judgement   ADL Overall ADL's : Needs assistance/impaired                                     Functional mobility during ADLs: Moderate assistance;Cueing for safety;Cueing for sequencing General ADL Comments: session focus on bed mobility, exercises and neuromuscular re-education     Vision       Perception     Praxis      Cognition Arousal/Alertness: Awake/alert Behavior During Therapy: WFL for tasks assessed/performed Overall Cognitive Status: Impaired/Different from baseline Area of Impairment: Safety/judgement;Awareness;Memory                     Memory: Decreased recall of precautions   Safety/Judgement: Decreased awareness of deficits;Decreased awareness of safety Awareness: Emergent   General Comments: requires frequent cues to maintain weight bearing precautions for LUE        Exercises General Exercises - Lower Extremity Ankle Circles/Pumps: AROM;Both;10 reps;Seated Quad Sets: AROM;Both;10 reps;Seated Toe Raises: AROM;Both;10 reps Heel Raises: AROM;Both;10 reps   Shoulder Instructions       General Comments      Pertinent Vitals/ Pain       Pain Assessment: Faces Faces Pain Scale: Hurts a little bit Pain Location:  IV site in L wrist Pain Descriptors / Indicators: Grimacing Pain Intervention(s): Limited activity within patient's tolerance;Monitored during session;Repositioned  Home Living                                          Prior Functioning/Environment              Frequency  Min 2X/week        Progress Toward Goals  OT Goals(current goals can now be found in the care plan section)  Progress towards OT goals: Progressing toward goals  Acute Rehab OT Goals Patient Stated Goal: to go home OT Goal Formulation: With patient/family Time For Goal  Achievement: 06/11/20 Potential to Achieve Goals: Good  Plan Discharge plan remains appropriate    Co-evaluation                 AM-PAC OT "6 Clicks" Daily Activity     Outcome Measure   Help from another person eating meals?: A Little Help from another person taking care of personal grooming?: A Little Help from another person toileting, which includes using toliet, bedpan, or urinal?: A Lot Help from another person bathing (including washing, rinsing, drying)?: A Lot Help from another person to put on and taking off regular upper body clothing?: A Lot Help from another person to put on and taking off regular lower body clothing?: A Lot 6 Click Score: 14    End of Session    OT Visit Diagnosis: Unsteadiness on feet (R26.81);Muscle weakness (generalized) (M62.81)   Activity Tolerance Patient tolerated treatment well   Patient Left in bed;with call bell/phone within reach;with bed alarm set;with family/visitor present   Nurse Communication Mobility status;Precautions        Time: 4259-5638 OT Time Calculation (min): 32 min  Charges: OT General Charges $OT Visit: 1 Visit OT Treatments $Self Care/Home Management : 8-22 mins $Therapeutic Exercise: 8-22 mins  Corinne Ports [address]. [place], [place] Acute Rehabilitation Services [phone] [place] 05/29/2020, 5:34 PM

## 2020-05-29 NOTE — Progress Notes (Signed)
Patient ID: [name], female   DOB: 02-23-1941, 79 y.o.   MRN: [id number]  Ok'd to give shoulder steroid injection. Unfortunately medication did not arrive from pharmacy in time. Will plan to inject tomorrow morning if patient is still an inpatient.    Lisette Abu, PA-C Orthopedic Surgery [phone]

## 2020-05-30 ENCOUNTER — Encounter (HOSPITAL_COMMUNITY)
Admission: EM | Disposition: A | Discharge status: Rehabilitation Facility | Payer: Self-pay | Source: Home / Self Care | Attending: Internal Medicine

## 2020-05-30 DIAGNOSIS — I6389 Other cerebral infarction: Secondary | ICD-10-CM

## 2020-05-30 HISTORY — PX: LOOP RECORDER INSERTION: EP1214

## 2020-05-30 SURGERY — LOOP RECORDER INSERTION

## 2020-05-30 MED ORDER — LIDOCAINE HCL (PF) 1 % IJ SOLN: INTRAMUSCULAR | Status: DC | PRN

## 2020-05-30 MED ORDER — LIDOCAINE-EPINEPHRINE 1 %-1:100000 IJ SOLN
INTRAMUSCULAR | Status: AC
  Filled 2020-05-30: qty 1

## 2020-05-30 MED ORDER — METHOCARBAMOL 500 MG PO TABS
500.0000 mg | ORAL_TABLET | Freq: Three times a day (TID) | ORAL | Status: DC | PRN
  Administered 2020-05-30 – 2020-05-31 (×3): 500 mg via ORAL
  Filled 2020-05-30 (×3): qty 1

## 2020-05-30 MED ORDER — LIDOCAINE-EPINEPHRINE 1 %-1:100000 IJ SOLN
INTRAMUSCULAR | Status: DC | PRN
  Administered 2020-05-30: 20 mL via INTRADERMAL

## 2020-05-30 SURGICAL SUPPLY — 2 items
MONITOR REVEAL LINQ II (Prosthesis & Implant Heart) ×1 IMPLANT
PACK LOOP INSERTION (CUSTOM PROCEDURE TRAY) ×2 IMPLANT

## 2020-05-30 NOTE — Progress Notes (Signed)
Inpatient Rehab Admissions Coordinator:   I have a bed for this pt to admit to CIR on Saturday (12/18).  Awaiting confirmation from Dr. Posey Pronto.  If medically ready, rehab MD (Dr. Dagoberto Ligas) to assess pt and confirm admission on Saturday.  Floor RN can call CIR at [phone] for report after 12pm on Saturday.  I have let pt/family and case manager know.    Shann Medal, PT, DPT Admissions Coordinator [phone] 05/30/20  12:32 PM

## 2020-05-30 NOTE — Progress Notes (Signed)
[facility] Progress Note  Patient: [name]    IRC:[id number]  DOA: 05/26/2020     Date of Service: the patient was seen and examined on 05/30/2020  Brief hospital course: Past medical history of chronic pain syndrome, renal cell carcinoma SP right nephrectomy, bilateral rotator cuff injury presents with complaints of left leg numbness and a fall.  Found to have acute CVA and triquetral avulsion fracture. Currently plan is CIR arrangement.  Assessment and Plan: 1.  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 MRI brain shows multifocal ischemic lesions on the right hemisphere in the Agency territory. MRA shows right ACA occlusion. Echocardiogram shows 60 to 65% EF.  No emboli in the cardiac echocardiogram. Dopplers negative for DVT. SVT/PAC on telemetry so far, no A. fib. Loop recorder on 12/17 LDL 143 not on any medication prior to admission. Currently on 81 mg aspirin daily +75 mg Plavix daily. DAPT for 3 months followed by aspirin alone. PT OT recommends CIR. Passed swallowing evaluation.  2.  Hyperlipidemia LDL 143. Currently on 40 mg daily Lipitor.  Continue.  3.  Fall Left wrist fracture Fall occurred secondary to stroke. X-ray shows triquetral avulsion fracture. Currently splint splint. Orthopedic recommends outpatient follow-up. Pain tolerable with Tylenol but frequency is not enough. We will add tramadol.  4.  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 Appreciate orthopedics for shoulder injection.  5.  Neck pain. As needed Robaxin.  6.  Constipation. Continue bowel regimen.  Diet: Cardiac diet DVT Prophylaxis:   enoxaparin (LOVENOX) injection 40 mg Start: 05/27/20 1715 SCDs Start: 05/27/20 0055    Advance goals of care discussion: Full code  Family Communication: family was present at bedside, at  the time of interview.  The pt provided permission to discuss medical plan with the family. Opportunity was given to ask question and all questions were answered satisfactorily.   Disposition:  Status is: Inpatient  Remains inpatient appropriate because:Ongoing diagnostic testing needed not appropriate for outpatient work up and Unsafe d/c plan  Dispo: The patient is from: Home              Anticipated d/c is to: CIR              Anticipated d/c date is: 1 day              Patient currently is medically stable to d/c.  Subjective: Reports some neck pain as well as upper back pain.  No nausea no vomiting.  No fever no chills. No focal deficit.  Physical Exam:  General: Appear in mild distress, no Rash; Oral Mucosa Clear, moist. no Abnormal Neck Mass Or lumps, Conjunctiva normal  Cardiovascular: S1 and S2 Present, no Murmur, Respiratory: good respiratory effort, Bilateral Air entry present and CTA, no Crackles, no wheezes Abdomen: Bowel Sound present, Soft and no tenderness Extremities: no Pedal edema Neurology: alert and oriented to time, place, and person affect appropriate. no new focal deficit Gait not checked due to patient safety concerns  Vitals:   05/30/20 0408 05/30/20 0827 05/30/20 1234 05/30/20 1628  BP: (!) 161/65 (!) 149/64 130/79 136/66  Pulse: 77 77 73 79  Resp: 18 18 18 18   Temp: 98.3 F (36.8 C) 98.7 F (37.1 C) 97.9 F (36.6 C) 98.1 F (36.7 C)  TempSrc: Oral Oral Oral Oral  SpO2: 97% 93% 93% 94%   No intake  or output data in the 24 hours ending 05/30/20 1948 There were no vitals filed for this visit.  Data Reviewed: I have personally reviewed and interpreted daily labs, tele strips, imagings as discussed above. I reviewed all nursing notes, pharmacy notes, vitals, pertinent old records I have discussed plan of care as described above with RN and patient/family.  CBC: Recent Labs  Lab 05/26/20 1639 05/27/20 0240 05/29/20 0201  WBC 9.2 5.6 6.9   NEUTROABS 7.0  --  4.1  HGB 13.7 12.5 12.9  HCT 43.6 38.3 40.1  MCV 94.0 92.7 91.3  PLT 339 287 518   Basic Metabolic Panel: Recent Labs  Lab 05/26/20 1639 05/27/20 0240 05/29/20 0201  NA 138 138 138  K 4.3 3.7 3.8  CL 102 104 103  CO2 26 26 26   GLUCOSE 102* 93 112*  BUN 9 9 13   CREATININE 0.82 0.86 0.93  CALCIUM 9.3 9.0 8.9  MG  --  2.1 2.1  PHOS  --  3.2  --     Studies: No results found.  Scheduled Meds: . aspirin EC  81 mg Oral Daily  . atorvastatin  40 mg Oral Daily  . bupivacaine  10 mL Infiltration Once  . clopidogrel  75 mg Oral Daily  . enoxaparin (LOVENOX) injection  40 mg Subcutaneous Q24H  . methylPREDNISolone acetate  40 mg Intra-articular Once  . pantoprazole  40 mg Oral Daily  . polyethylene glycol  17 g Oral Daily  . senna-docusate  1 tablet Oral BID   Continuous Infusions: PRN Meds: acetaminophen, methocarbamol, ondansetron (ZOFRAN) IV, traMADol  Time spent: 35 minutes  Author: Berle Mull, MD [facility] 05/30/2020 7:48 PM  To reach On-call, see care teams to locate the attending and reach out via [email or web address]. Between 7PM-7AM, please contact night-coverage If you still have difficulty reaching the attending provider, please page the [facility] (Director on Call) for [facility] on amion for assistance.

## 2020-05-30 NOTE — Procedures (Signed)
Procedure: Right shoulder injection  Indication: Right shoulder pain  Surgeon: Silvestre Gunner, PA-C  Assist: None  Anesthesia: Topical refrigerant  EBL: None  Complications: None  Findings: After risks/benefits explained patient desires to undergo procedure. Consent obtained and time out performed. The right shoulder was marked prepped and the subacromial space was entered from a posterior approach. 42ml 0.5% Marcaine and 40mg  depomedrol was instilled. Pt tolerated the procedure well.    Lisette Abu, PA-C Orthopedic Surgery [phone]

## 2020-05-30 NOTE — Consult Note (Signed)
ELECTROPHYSIOLOGY CONSULT NOTE  Patient ID: [name] MRN: [id number], DOB/AGE: Jun 21, 1940   Admit date: 05/26/2020 Date of Consult: 05/30/2020  Primary Physician: Hulan Fess, MD Primary Cardiologist: No primary care provider on file.  Primary Electrophysiologist: New to None  Reason for Consultation: Cryptogenic stroke; recommendations regarding Implantable Loop Recorder Insurance: [employer]  History of Present Illness EP has been asked to evaluate [name] for placement of an implantable loop recorder to monitor for atrial fibrillation by Dr Erlinda Hong.  The patient was admitted on 05/26/2020 with LLE numbness/weakness resulting in a fall and left wrist fracture.  She has undergone workup for stroke including:   CT head - No CT evidence for acute intracranial abnormality. Chronic right cerebellar infarct. Mild chronic small vessel ischemic change of the white matter. 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   MRI head -  Multifocal acute ischemia within the right hemisphere, predominantly within the anterior frontal lobe and medial parietal lobe. 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   MRA head - Right A2 ACA occlusion. Areas of infarction on prior study could reflect watershed ischemia. Moderate right supraclinoid ICA stenosis.   MRA Neck - Normal MRA of the neck.   2D Echo - EF 60 - 65%. No cardiac source of emboli identified.   LE venous doppler neg for DVT  Recommend loop recorder to rule out afib - EP on board, plan for loop before CIR  [employer] Virus 2 - negative  The patient has been monitored on telemetry which has demonstrated sinus rhythm with no arrhythmias.  Inpatient stroke work-up will not require a TEE per Neurology.   Lab work is reviewed.  Prior to admission, the patient denies chest pain, shortness of breath, dizziness,  palpitations, or syncope. She continues to have LLE weakness. She is recovering from her stroke with plans to attend CIR  at discharge.  Past Medical History:  Diagnosis Date  . Blood transfusion without reported diagnosis    with hip fracture-79 years old  . Cancer [facility]) 1983   Kidney Cancer  . Diverticulosis   . GERD (gastroesophageal reflux disease)   . Personal history of kidney cancer      Surgical History:  Past Surgical History:  Procedure Laterality Date  . ABDOMINAL HYSTERECTOMY    . COLON SURGERY     Severe sigmoid diverticulitis  . COLONOSCOPY    . FOOT SURGERY    . HIP SURGERY    . Kidney cancer     Kidney removed  . POLYPECTOMY       Medications Prior to Admission  Medication Sig Dispense Refill Last Dose  . acetaminophen (TYLENOL) 650 MG CR tablet Take 650 mg by mouth daily.   05/26/2020 at Unknown time  . estradiol (ESTRACE) 0.5 MG tablet Take 0.5 mg by mouth every evening.   05/25/2020 at Unknown time  . MAGNESIUM PO Take 1 tablet by mouth every evening.   05/25/2020 at Unknown time  . Multiple Vitamins-Minerals (MULTIVITAMIN WITH MINERALS) tablet Take 1 tablet by mouth daily.   05/26/2020 at Unknown time  . omeprazole (PRILOSEC) 40 MG capsule Take 40 mg by mouth daily.   05/26/2020 at Unknown time  . vitamin C (ASCORBIC ACID) 500 MG tablet Take 500 mg by mouth daily.   05/26/2020 at Unknown time    Inpatient Medications:  . aspirin EC  81 mg  Oral Daily  . atorvastatin  40 mg Oral Daily  . bupivacaine  10 mL Infiltration Once  . clopidogrel  75 mg Oral Daily  . enoxaparin (LOVENOX) injection  40 mg Subcutaneous Q24H  . methylPREDNISolone acetate  40 mg Intra-articular Once  . pantoprazole  40 mg Oral Daily  . polyethylene glycol  17 g Oral Daily  . senna-docusate  1 tablet Oral BID    Allergies:  Allergies  Allergen Reactions  . Codeine Other (See Comments)    Pt unsure if really allergic to this    Social History   Socioeconomic History  .  Marital status: Married    Spouse name: Not on file  . Number of children: Not on file  . Years of education: Not on file  . Highest education level: Not on file  Occupational History  . Not on file  Tobacco Use  . Smoking status: Never Smoker  . Smokeless tobacco: Never Used  Substance and Sexual Activity  . Alcohol use: No  . Drug use: No  . Sexual activity: Not on file  Other Topics Concern  . Not on file  Social History Narrative  . Not on file   Social Determinants of Health   Financial Resource Strain: Not on file  Food Insecurity: Not on file  Transportation Needs: Not on file  Physical Activity: Not on file  Stress: Not on file  Social Connections: Not on file  Intimate Partner Violence: Not on file     Family History  Problem Relation Age of Onset  . Colon cancer Mother   . Coronary artery disease Father   . Breast cancer Neg Hx       Review of Systems: All other systems reviewed and are otherwise negative except as noted above.  Physical Exam: Vitals:   05/29/20 2029 05/29/20 2356 05/30/20 0408 05/30/20 0827  BP: (!) 146/88 (!) 146/68 (!) 161/65 (!) 149/64  Pulse: 71 70 77 77  Resp: 17 18 18 18   Temp: 97.9 F (36.6 C) 97.8 F (36.6 C) 98.3 F (36.8 C) 98.7 F (37.1 C)  TempSrc: Oral Oral Oral Oral  SpO2: 96% 95% 97% 93%    GEN- The patient is well appearing, alert and oriented x 3 today.   Head- normocephalic, atraumatic Eyes-  Sclera clear, conjunctiva pink Ears- hearing intact Oropharynx- clear Neck- supple Lungs- Clear to ausculation bilaterally, normal work of breathing Heart- Regular rate and rhythm, no murmurs, rubs or gallops  GI- soft, NT, ND, + BS Extremities- no clubbing, cyanosis, or edema MS- no significant deformity or atrophy Skin- no rash or lesion Psych- euthymic mood, full affect   Labs:   Lab Results  Component Value Date   WBC 6.9 05/29/2020   HGB 12.9 05/29/2020   HCT 40.1 05/29/2020   MCV 91.3 05/29/2020    PLT 297 05/29/2020    Recent Labs  Lab 05/29/20 0201  NA 138  K 3.8  CL 103  CO2 26  BUN 13  CREATININE 0.93  CALCIUM 8.9  PROT 6.0*  BILITOT 0.8  ALKPHOS 60  ALT 14  AST 18  GLUCOSE 112*     Radiology/Studies: DG Wrist Complete Left  Result Date: 05/26/2020 CLINICAL DATA:  Fall, pain EXAM: LEFT WRIST - COMPLETE 3+ VIEW COMPARISON:  None. FINDINGS: There is a small fragment dorsal to the mid carpus seen on lateral view. No other fracture or dislocation. Thumb basal arthrosis. Dorsal soft tissue edema. IMPRESSION: There is a small fragment  dorsal to the mid carpus seen on lateral view, suspicious for triquetral avulsion fracture. No other fracture or dislocation. Dorsal soft tissue edema. Electronically Signed   By: Eddie Candle M.D.   On: 05/26/2020 15:47   CT HEAD WO CONTRAST  Result Date: 05/26/2020 CLINICAL DATA:  Left leg numbness EXAM: CT HEAD WITHOUT CONTRAST TECHNIQUE: Contiguous axial images were obtained from the base of the skull through the vertex without intravenous contrast. COMPARISON:  None. FINDINGS: Brain: No acute territorial infarction or hemorrhage is visualized. 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 Chronic infarct in the right cerebellum. Nonenlarged ventricles. Mild white matter hypodensity likely chronic small vessel ischemic change Vascular: No hyperdense vessels.  Carotid vascular calcification Skull: Normal. Negative for fracture Sinuses/Orbits: No acute finding. Other: None IMPRESSION: 1. No CT evidence for acute intracranial abnormality. Mild chronic small vessel ischemic change of the white matter. 2. 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 3. Chronic right cerebellar infarct. Electronically Signed   By: Donavan Foil M.D.   On:  05/26/2020 16:47   MR ANGIO HEAD WO CONTRAST  Result Date: 05/27/2020 CLINICAL DATA:  Left leg numbness, abnormal MRI brain EXAM: MRA HEAD WITHOUT CONTRAST TECHNIQUE: Angiographic images of the Circle of Willis were obtained using MRA technique without intravenous contrast. COMPARISON:  None. FINDINGS: Intracranial internal carotid arteries are patent with atherosclerotic irregularity and moderate stenosis of the right supraclinoid ICA. Proximal middle cerebral arteries are patent. There is occlusion of the right A2 ACA. Included intracranial vertebral arteries, basilar artery, posterior cerebral arteries are patent. IMPRESSION: Right A2 ACA occlusion. Areas of infarction on prior study could reflect watershed ischemia. Moderate right supraclinoid ICA stenosis. Electronically Signed   By: Macy Mis M.D.   On: 05/27/2020 11:38   MR ANGIO NECK W WO CONTRAST  Result Date: 05/27/2020 CLINICAL DATA:  Left leg numbness EXAM: MRI HEAD WITHOUT AND WITH CONTRAST MRA NECK WITHOUT AND WITH CONTRAST TECHNIQUE: Multiplanar, multiecho pulse sequences of the brain and surrounding structures were obtained without and with intravenous contrast. Angiographic images of the neck were obtained using MRA technique with and without intravenous contrast. Carotid stenosis measurements (when applicable) are obtained utilizing NASCET criteria, using the distal internal carotid diameter as the denominator. CONTRAST:  4mL GADAVIST GADOBUTROL 1 MMOL/ML IV SOLN COMPARISON:  None. FINDINGS: MRI HEAD FINDINGS Brain: Multifocal abnormal diffusion restriction within the right hemisphere, predominantly within the anterior frontal lobe and the medial parietal lobe. No acute or chronic hemorrhage. There is multifocal hyperintense T2-weighted signal within the white matter. Parenchymal volume and CSF spaces are normal. There are old bilateral cerebellar infarcts. 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 Vascular:  Major flow voids are preserved. Skull and upper cervical spine: Normal calvarium and skull base. Visualized upper cervical spine and soft tissues are normal. Sinuses/Orbits:No paranasal sinus fluid levels or advanced mucosal thickening. No mastoid or middle ear effusion. Normal orbits. MRA NECK FINDINGS Normal carotid and vertebral arteries. IMPRESSION: 1. Multifocal acute ischemia within the right hemisphere, predominantly within the anterior frontal lobe and medial parietal lobe. No hemorrhage or mass effect. 2. 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 3. Normal MRA of the neck. Electronically Signed   By: Ulyses Jarred M.D.   On: 05/27/2020 01:16   MR BRAIN W  WO CONTRAST  Result Date: 05/27/2020 CLINICAL DATA:  Left leg numbness EXAM: MRI HEAD WITHOUT AND WITH CONTRAST MRA NECK WITHOUT AND WITH CONTRAST TECHNIQUE: Multiplanar, multiecho pulse sequences of the brain and surrounding structures were obtained without and with intravenous contrast. Angiographic images of the neck were obtained using MRA technique with and without intravenous contrast. Carotid stenosis measurements (when applicable) are obtained utilizing NASCET criteria, using the distal internal carotid diameter as the denominator. CONTRAST:  39mL GADAVIST GADOBUTROL 1 MMOL/ML IV SOLN COMPARISON:  None. FINDINGS: MRI HEAD FINDINGS Brain: Multifocal abnormal diffusion restriction within the right hemisphere, predominantly within the anterior frontal lobe and the medial parietal lobe. No acute or chronic hemorrhage. There is multifocal hyperintense T2-weighted signal within the white matter. Parenchymal volume and CSF spaces are normal. There are old bilateral cerebellar infarcts. 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 Vascular: Major flow voids are preserved. Skull and upper cervical spine: Normal calvarium and skull base. Visualized upper cervical spine and soft tissues are normal. Sinuses/Orbits:No  paranasal sinus fluid levels or advanced mucosal thickening. No mastoid or middle ear effusion. Normal orbits. MRA NECK FINDINGS Normal carotid and vertebral arteries. IMPRESSION: 1. Multifocal acute ischemia within the right hemisphere, predominantly within the anterior frontal lobe and medial parietal lobe. No hemorrhage or mass effect. 2. 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 3. Normal MRA of the neck. Electronically Signed   By: Ulyses Jarred M.D.   On: 05/27/2020 01:16   DG Shoulder Left Port  Result Date: 05/28/2020 CLINICAL DATA:  Left shoulder pain after fall. EXAM: LEFT SHOULDER COMPARISON:  None. FINDINGS: There is no evidence of fracture or dislocation. There is no evidence of arthropathy or other focal bone abnormality. Soft tissues are unremarkable. IMPRESSION: Negative. Electronically Signed   By: Marijo Conception M.D.   On: 05/28/2020 15:39   DG Shoulder Right Port  Result Date: 05/28/2020 CLINICAL DATA:  Chronic right shoulder pain. EXAM: PORTABLE RIGHT SHOULDER COMPARISON:  None. FINDINGS: There is no evidence of fracture or dislocation. Moderate degenerative changes seen involving the right acromioclavicular joint. Soft tissues are unremarkable. 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 No acute abnormality seen in the right shoulder. Electronically Signed   By: Marijo Conception M.D.   On: 05/28/2020 15:39   DG Swallowing Func-Speech Pathology  Result Date: 05/29/2020 Objective Swallowing Evaluation: Type of Study: Bedside Swallow Evaluation  Patient Details Name: [name] MRN: [id number] Date of Birth: Oct 10, 1940 Today's Date: 05/29/2020 Time: SLP Start Time (ACUTE ONLY): 0930 -SLP Stop Time (ACUTE ONLY): 0945 SLP Time Calculation (min) (ACUTE ONLY): 15 min Past Medical History: Past Medical History: Diagnosis Date . Blood transfusion without reported diagnosis   with hip fracture-79 years old . Cancer [facility]) 1983  Kidney  Cancer . Diverticulosis  . GERD (gastroesophageal reflux disease)  . Personal history of kidney cancer  Past Surgical History: Past Surgical History: Procedure Laterality Date . ABDOMINAL HYSTERECTOMY   . COLON SURGERY    Severe sigmoid diverticulitis . COLONOSCOPY   . FOOT SURGERY   . HIP SURGERY   . Kidney cancer    Kidney removed . POLYPECTOMY   HPI: Pt is an 79 y.o. female with medical history significant hip fracture at age of 40, chronic pain syndrome, kidney cancer s/p right nephrectomy (per patient) who presented to the ED due to 2 to 3-day onset of left leg numbness (from knee to foot).  MRI brain: Multifocal acute ischemia  within the right hemisphere, predominantly within the anterior frontal lobe and medial parietal lobe. No hemorrhage or mass effect.  No data recorded Assessment / Plan / Recommendation CHL IP CLINICAL IMPRESSIONS 05/29/2020 Clinical Impression Pt was seen in radiology suite for modified barium swallow study. Trials of puree solids, regular texture solids, a 73mm barium tablet, and thin liquids via cup and straw were administered. Pt's oropharyngeal swallow mechanism was within normal limits. Throat clearing and a single cough were intermittently noted during the study; however, no instances of penetration or aspiration were demonstrated. It is recommended that a regular texture diet with thin liquids be continued at this time. Further skilled SLP services are not clinically indicated for swallowing, but SLP will follow for cognitive-linguistic treatment. SLP Visit Diagnosis Dysphagia, unspecified (R13.10) Attention and concentration deficit following -- Frontal lobe and executive function deficit following -- Impact on safety and function No limitations   CHL IP TREATMENT RECOMMENDATION 05/29/2020 Treatment Recommendations No treatment recommended at this time   No flowsheet data found. CHL IP DIET RECOMMENDATION 05/29/2020 SLP Diet Recommendations Regular solids;Thin liquid Liquid  Administration via Cup;Straw Medication Administration Whole meds with liquid Compensations -- Postural Changes Seated upright at 90 degrees   CHL IP OTHER RECOMMENDATIONS 05/29/2020 Recommended Consults -- Oral Care Recommendations Oral care BID Other Recommendations --   CHL IP FOLLOW UP RECOMMENDATIONS 05/29/2020 Follow up Recommendations Inpatient Rehab   CHL IP FREQUENCY AND DURATION 05/29/2020 Speech Therapy Frequency (ACUTE ONLY) min 2x/week Treatment Duration 2 weeks      CHL IP ORAL PHASE 05/29/2020 Oral Phase WFL Oral - Pudding Teaspoon -- Oral - Pudding Cup -- Oral - Honey Teaspoon -- Oral - Honey Cup -- Oral - Nectar Teaspoon -- Oral - Nectar Cup -- Oral - Nectar Straw -- Oral - Thin Teaspoon -- Oral - Thin Cup -- Oral - Thin Straw -- Oral - Puree -- Oral - Mech Soft -- Oral - Regular -- Oral - Multi-Consistency -- Oral - Pill -- Oral Phase - Comment --  CHL IP PHARYNGEAL PHASE 05/29/2020 Pharyngeal Phase WFL Pharyngeal- Pudding Teaspoon -- Pharyngeal -- Pharyngeal- Pudding Cup -- Pharyngeal -- Pharyngeal- Honey Teaspoon -- Pharyngeal -- Pharyngeal- Honey Cup -- Pharyngeal -- Pharyngeal- Nectar Teaspoon -- Pharyngeal -- Pharyngeal- Nectar Cup -- Pharyngeal -- Pharyngeal- Nectar Straw -- Pharyngeal -- Pharyngeal- Thin Teaspoon -- Pharyngeal -- Pharyngeal- Thin Cup -- Pharyngeal -- Pharyngeal- Thin Straw -- Pharyngeal -- Pharyngeal- Puree -- Pharyngeal -- Pharyngeal- Mechanical Soft -- Pharyngeal -- Pharyngeal- Regular -- Pharyngeal -- Pharyngeal- Multi-consistency -- Pharyngeal -- Pharyngeal- Pill -- Pharyngeal -- Pharyngeal Comment --  CHL IP CERVICAL ESOPHAGEAL PHASE 05/29/2020 Cervical Esophageal Phase WFL Pudding Teaspoon -- Pudding Cup -- Honey Teaspoon -- Honey Cup -- Nectar Teaspoon -- Nectar Cup -- Nectar Straw -- Thin Teaspoon -- Thin Cup -- Thin Straw -- Puree -- Mechanical Soft -- Regular -- Multi-consistency -- Pill -- Cervical Esophageal Comment -- Shanika I. Hardin Negus, [place], [place] Office number [phone] [place] 05/29/2020, 10:24 AM              ECHOCARDIOGRAM COMPLETE  Result Date: 05/27/2020    ECHOCARDIOGRAM REPORT   Patient Name:   [name] Date of Exam: 05/27/2020 Medical Rec #:  [id number]      Height:       64.5 in Accession #:    [id number]     Weight:       155.0 lb Date of Birth:  1941-01-15      BSA:          1.765 m Patient Age:    28 years       BP:           155/75 mmHg Patient Gender: F              HR:           67 bpm. Exam Location:  Inpatient Procedure: 2D Echo Indications:    stroke 434.91  History:        Patient has no prior history of Echocardiogram examinations.  Sonographer:    Johny Chess Referring Phys: [id number] OLADAPO ADEFESO IMPRESSIONS  1. Left ventricular ejection fraction, by estimation, is 60 to 65%. The left ventricle has normal function. The left ventricle has no regional wall motion abnormalities. Left ventricular diastolic parameters are consistent with Grade I diastolic dysfunction (impaired relaxation).  2. Right ventricular systolic function is normal. The right ventricular size is normal. There is normal pulmonary artery systolic pressure.  3. The mitral valve is normal in structure. Trivial mitral valve regurgitation.  4. The aortic valve is tricuspid. There is mild calcification of the aortic valve. There is mild thickening of the aortic valve. Aortic valve regurgitation is not visualized. Mild aortic valve sclerosis is present, with no evidence of aortic valve stenosis.  5. The inferior vena cava is normal in size with greater than 50% respiratory variability, suggesting right atrial pressure of 3 mmHg. Comparison(s): No prior Echocardiogram. Conclusion(s)/Recommendation(s): No intracardiac source of embolism detected on this transthoracic study. A transesophageal echocardiogram is recommended to exclude cardiac source of embolism if clinically indicated. FINDINGS  Left  Ventricle: Left ventricular ejection fraction, by estimation, is 60 to 65%. The left ventricle has normal function. The left ventricle has no regional wall motion abnormalities. The left ventricular internal cavity size was normal in size. There is  borderline concentric left ventricular hypertrophy. Left ventricular diastolic parameters are consistent with Grade I diastolic dysfunction (impaired relaxation). Right Ventricle: The right ventricular size is normal. No increase in right ventricular wall thickness. Right ventricular systolic function is normal. There is normal pulmonary artery systolic pressure. The tricuspid regurgitant velocity is 1.98 m/s, and  with an assumed right atrial pressure of 3 mmHg, the estimated right ventricular systolic pressure is 00.1 mmHg. Left Atrium: Left atrial size was normal in size. Right Atrium: Right atrial size was normal in size. Pericardium: There is no evidence of pericardial effusion. Mitral Valve: The mitral valve is normal in structure. There is mild thickening of the mitral valve leaflet(s). Mild mitral annular calcification. Trivial mitral valve regurgitation. Tricuspid Valve: The tricuspid valve is normal in structure. Tricuspid valve regurgitation is trivial. Aortic Valve: The aortic valve is tricuspid. There is mild calcification of the aortic valve. There is mild thickening of the aortic valve. There is mild aortic valve annular calcification. Aortic valve regurgitation is not visualized. Mild aortic valve sclerosis is present, with no evidence of aortic valve stenosis. Pulmonic Valve: The pulmonic valve was normal in structure. Pulmonic valve regurgitation is not visualized. Aorta: The aortic root is normal in size and structure. Venous: The inferior vena cava is normal in size with greater than 50% respiratory variability, suggesting right atrial pressure of 3 mmHg. IAS/Shunts: No atrial level shunt detected by color flow Doppler.  LEFT VENTRICLE PLAX 2D LVIDd:          3.60 cm Diastology LVIDs:         2.10  cm LV e' medial:    6.09 cm/s LV PW:         1.00 cm LV E/e' medial:  13.9 LV IVS:        1.00 cm LV e' lateral:   8.16 cm/s                        LV E/e' lateral: 10.4  RIGHT VENTRICLE             IVC RV S prime:     17.80 cm/s  IVC diam: 1.30 cm TAPSE (M-mode): 2.3 cm LEFT ATRIUM             Index       RIGHT ATRIUM          Index LA diam:        3.40 cm 1.93 cm/m  RA Area:     9.99 cm LA Vol (A2C):   47.9 ml 27.14 ml/m RA Volume:   19.60 ml 11.11 ml/m LA Vol (A4C):   41.7 ml 23.63 ml/m LA Biplane Vol: 45.0 ml 25.50 ml/m  AORTIC VALVE LVOT Vmax:   93.80 cm/s LVOT Vmean:  62.700 cm/s LVOT VTI:    0.224 m MITRAL VALVE                TRICUSPID VALVE MV Area (PHT): 2.45 cm     TR Peak grad:   15.7 mmHg MV Decel Time: 310 msec     TR Vmax:        198.00 cm/s MV E velocity: 84.60 cm/s MV A velocity: 104.00 cm/s  SHUNTS MV E/A ratio:  0.81         Systemic VTI: 0.22 m Gwyndolyn Kaufman MD Electronically signed by Gwyndolyn Kaufman MD Signature Date/Time: 05/27/2020/11:11:34 AM    Final    VAS [place] LOWER EXTREMITY VENOUS (DVT)  Result Date: 05/27/2020  Lower Venous DVT Study Indications: Stroke.  Comparison Study: no prior Performing Technologist: Abram Sander RVS  Examination Guidelines: A complete evaluation includes B-mode imaging, spectral Doppler, color Doppler, and power Doppler as needed of all accessible portions of each vessel. Bilateral testing is considered an integral part of a complete examination. Limited examinations for reoccurring indications may be performed as noted. The reflux portion of the exam is performed with the patient in reverse Trendelenburg.  +---------+---------------+---------+-----------+----------+--------------+ RIGHT    CompressibilityPhasicitySpontaneityPropertiesThrombus Aging +---------+---------------+---------+-----------+----------+--------------+ CFV      Full           Yes      Yes                                  +---------+---------------+---------+-----------+----------+--------------+ SFJ      Full                                                        +---------+---------------+---------+-----------+----------+--------------+ FV Prox  Full                                                        +---------+---------------+---------+-----------+----------+--------------+ FV Mid   Full                                                        +---------+---------------+---------+-----------+----------+--------------+  FV DistalFull                                                        +---------+---------------+---------+-----------+----------+--------------+ PFV      Full                                                        +---------+---------------+---------+-----------+----------+--------------+ POP      Full           Yes      Yes                                 +---------+---------------+---------+-----------+----------+--------------+ PTV      Full                                                        +---------+---------------+---------+-----------+----------+--------------+ PERO     Full                                                        +---------+---------------+---------+-----------+----------+--------------+   +---------+---------------+---------+-----------+----------+--------------+ LEFT     CompressibilityPhasicitySpontaneityPropertiesThrombus Aging +---------+---------------+---------+-----------+----------+--------------+ CFV      Full           Yes      Yes                                 +---------+---------------+---------+-----------+----------+--------------+ SFJ      Full                                                        +---------+---------------+---------+-----------+----------+--------------+ FV Prox  Full                                                         +---------+---------------+---------+-----------+----------+--------------+ FV Mid   Full                                                        +---------+---------------+---------+-----------+----------+--------------+ FV DistalFull                                                        +---------+---------------+---------+-----------+----------+--------------+   PFV      Full                                                        +---------+---------------+---------+-----------+----------+--------------+ POP      Full           Yes      Yes                                 +---------+---------------+---------+-----------+----------+--------------+ PTV      Full                                                        +---------+---------------+---------+-----------+----------+--------------+ PERO     Full                                                        +---------+---------------+---------+-----------+----------+--------------+     Summary: BILATERAL: - No evidence of deep vein thrombosis seen in the lower extremities, bilaterally. - No evidence of superficial venous thrombosis in the lower extremities, bilaterally. -No evidence of popliteal cyst, bilaterally.   *See table(s) above for measurements and observations. Electronically signed by Deitra Mayo MD on 05/27/2020 at 1:48:00 PM.    Final     12-lead ECG on arrival shows NSR at 65 bpm (personally reviewed) No previous EKG's in EPIC  Telemetry NSR 70-80s (personally reviewed)  Assessment and Plan:  1. Cryptogenic stroke The patient presents with cryptogenic stroke.  The patient does not have a TEE planned for this AM.  I spoke at length with the patient about monitoring for afib with an implantable loop recorder.  Risks, benefits, and alteratives to implantable loop recorder were discussed with the patient today.   At this time, the patient is very clear in their decision to proceed with implantable  loop recorder.   Wound care was reviewed with the patient (keep incision clean and dry for 3 days).  Wound check scheduled and entered in AVS. Please call with questions.   Shirley Friar, PA-C 05/30/2020 12:18 PM

## 2020-05-30 NOTE — PMR Pre-admission (Shared)
PMR Admission Coordinator Pre-Admission Assessment  Patient: [name] is an 79 y.o., female MRN: [id number] DOB: Oct 27, 1940 Height:   Weight:                Insurance Information HMO:     PPO: yes     PCP:      IPA:      80/20:      OTHER:  PRIMARY: [employer]      Policy#: [id number]      Subscriber: pt CM Name: Lynelle Smoke      Phone#: [phone]     Fax#: EPIC access Pre-Cert#: [id number] auth for CIR provided by Tammy with [employer].  Update due weekly, and [facility] has epic access      Employer: n/a Benefits:  Phone #: [phone]     Name: n/a Eff. Date: 06/15/19     Deduct: $0      Out of Pocket Max: $3400 (met $126.76)      Life Max: n/a  CIR: $295/day for days 1-6      SNF: 20 full days Outpatient:      Co-Pay: $20/visit Home Health: 100%      Co-Pay:  DME: 80%     Co-Pay: 20% Providers:  SECONDARY:       Policy#:       Phone#:   Development worker, community:       Phone#:   The Therapist, art Information Summary" for patients in Inpatient Rehabilitation Facilities with attached "Privacy Act [place] Records" was provided and verbally reviewed with: Patient and Family  Emergency Contact Information Contact Information    Name Relation Home Work Mobile   Enke,[name] Spouse [phone]  [phone]     Current Medical History  Patient Admitting Diagnosis: CVA   History of Present Illness: [name] is a 79 y.o. 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  During her ED stay left lower extremity weakness had resolved and she did not receive tPA.  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  2D echo showed EF 65% with no wall and mild aortic valve sclerosis.  BLE Dopplers were negative for DVT.  Patient had recurrent LLE weakness and  Dr. 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  Loop recorder recommended to rule out A fib and DAPT x3 months to be followed by ASA alone.  Patient with resultant right sided weakness with numbness, posterior lean with sitting and inability to stand.  Assumed to be NWB Left wrist due to triquetral avulsion fracture.  Pt states wrist pain started after fall ~2d PTA.  Also notes an episode of LLE numbness occurring >several days PTA but this occurred after resting left lateral knee against hand of cabinet while sitting at her bar. Fall occurred 2d prior to admission, pt developed Left side weakness on day of admission shortly before a scheduled orthopedica appt.    Patient has been reporting right shoulder pain since fall and has difficulty with extension at shoulder; post cortisone injection reporting improved pain.  Therapy evaluations completed and patient  showing excellent progress, with recommendations for CIR.   Complete NIHSS TOTAL: 2 Glasgow Coma Scale Score: 15  Past Medical History  Past Medical History:  Diagnosis Date  . Blood transfusion without reported diagnosis    with hip fracture-79 years old  . Cancer [facility]) 1983   Kidney Cancer  . Diverticulosis   . GERD (gastroesophageal reflux disease)   . Personal history of kidney cancer     Family History  family history includes Colon cancer in her mother; Coronary artery disease in her father.  Prior Rehab/Hospitalizations:  Has the patient had prior rehab or hospitalizations prior to admission? No  Has the patient had major surgery during 100 days prior to admission? Yes  Current Medications   Current Facility-Administered Medications:  .  acetaminophen (TYLENOL)  tablet 650 mg, 650 mg, Oral, Q6H PRN, Samuella Cota, MD, 650 mg at 05/29/20 1854 .  aspirin EC tablet 81 mg, 81 mg, Oral, Daily, 81 mg at 05/30/20 0844 **OR** [DISCONTINUED] aspirin suppository 300 mg, 300 mg, Rectal, Daily, Rosalin Hawking, MD .  atorvastatin (LIPITOR) tablet 40 mg, 40 mg, Oral, Daily, Rosalin Hawking, MD, 40 mg at 05/30/20 0845 .  bupivacaine (MARCAINE) 0.5 % injection 10 mL, 10 mL, Infiltration, Once, Lisette Abu, PA-C .  clopidogrel (PLAVIX) tablet 75 mg, 75 mg, Oral, Daily, Rosalin Hawking, MD, 75 mg at 05/30/20 0844 .  enoxaparin (LOVENOX) injection 40 mg, 40 mg, Subcutaneous, Q24H, Rosalin Hawking, MD, 40 mg at 05/29/20 1854 .  methocarbamol (ROBAXIN) tablet 500 mg, 500 mg, Oral, Q8H PRN, Lavina Hamman, MD .  methylPREDNISolone acetate (DEPO-MEDROL) injection 40 mg, 40 mg, Intra-articular, Once, Lisette Abu, PA-C .  ondansetron [facility]) injection 4 mg, 4 mg, Intravenous, Q6H PRN, Lavina Hamman, MD .  pantoprazole (PROTONIX) EC tablet 40 mg, 40 mg, Oral, Daily, Adefeso, Oladapo, DO, 40 mg at 05/30/20 0844 .  polyethylene glycol (MIRALAX / GLYCOLAX) packet 17 g, 17 g, Oral, Daily, Lavina Hamman, MD, 17 g at 05/30/20 0845 .  senna-docusate (Senokot-S) tablet 1 tablet, 1 tablet, Oral, BID, Lavina Hamman, MD, 1 tablet at 05/30/20 0844 .  traMADol (ULTRAM) tablet 50 mg, 50 mg, Oral, Q6H PRN, Lavina Hamman, MD, 50 mg at 05/30/20 1022  Patients Current Diet:  Diet Order            Diet Heart Room service appropriate? Yes; Fluid consistency: Thin  Diet effective now                 Precautions / Restrictions Precautions Precautions: Fall Restrictions Weight Bearing Restrictions: Yes LUE Weight Bearing: Weight bear through elbow only Other Position/Activity Restrictions: Assumed NWB on L wrist   Has the patient had 2 or more falls or a fall with injury in the past year?Yes  Prior Activity Level Community (5-7x/wk): independent prior to admit, no DME,  driving, active  Prior Functional Level Prior Function Level of Independence: Independent Comments: Was independent with ambulation prior to admission  Self Care: Did the patient need help bathing, dressing, using the toilet or eating?  Independent  Indoor Mobility: Did the patient need assistance with walking from room to room (with or without device)? Independent  Stairs: Did the patient need assistance with internal or external stairs (with or without device)? Independent  Functional Cognition: Did the patient need help planning regular tasks such as shopping or remembering to take medications? [place] / Equipment Home Equipment: Walker - 2 wheels  Prior Device Use: Indicate devices/aids used by the patient prior to current illness, exacerbation or injury? None of the above  Current Functional Level Cognition  Arousal/Alertness: Awake/alert Overall Cognitive Status: Impaired/Different from baseline Current Attention Level: Sustained Orientation Level: Oriented X4 Safety/Judgement: Decreased awareness of deficits,Decreased awareness of safety General Comments: requires frequent cues to maintain weight bearing precautions for LUE Attention: Focused,Sustained Focused Attention: Appears intact Sustained Attention: Appears intact Memory: Impaired Memory Impairment: Retrieval deficit,Decreased recall of new information (Immediate: 5/5; Delayed: 4/5; with cues: 1/1) Awareness: Appears intact Problem Solving: Appears intact Executive Function:  (Clock drawing:L 0/4) Organizing: Impaired Organizing Impairment: Verbal complex (Backward digit span: 1/2)    Extremity Assessment (includes Sensation/Coordination)  Upper Extremity Assessment: LUE deficits/detail LUE Deficits / Details: splint MCP to forearm at this time. Daughter asking questions about taking it off. Pt and daughter educated splint to remain per orders at thistime  Lower Extremity  Assessment: Defer to PT evaluation,LLE deficits/detail LLE Deficits / Details: see PT notes- recommended tennis shoe due to ankle weakness and prevent lateral rolling of ankle-lateral side of knee area reports numbness    ADLs  Overall ADL's : Needs assistance/impaired Eating/Feeding: Set up,Sitting Upper Body Bathing: Minimal assistance Lower Body Bathing: Maximal assistance Upper Body Dressing : Minimal assistance Toilet Transfer: +2 for physical assistance,Moderate assistance Toileting- Clothing Manipulation and Hygiene: Maximal assistance Functional mobility during ADLs: Moderate assistance,Cueing for safety,Cueing for sequencing General ADL Comments: session focus on bed mobility, exercises and neuromuscular re-education    Mobility  Overal bed mobility: Needs Assistance Bed Mobility: Supine to Sit,Sit to Supine Supine to sit: Mod assist Sit to supine: Mod assist General bed mobility comments: increased time and cues in order to motor plan with weight bearing precautions    Transfers  Overall transfer level: Needs assistance Equipment used: Left platform walker Transfers: Sit to/from Stand Sit to Stand: Min assist Stand pivot transfers: Mod assist,+2 physical assistance    Ambulation / Gait / Stairs / Wheelchair Mobility  Ambulation/Gait Ambulation/Gait assistance: Herbalist (Feet): 80 Feet Assistive device: Left platform walker Gait Pattern/deviations: Step-to pattern General Gait Details: pt with short step-to gait, intermittent L foot drag which is exacerbated by fatigue Gait velocity: reduced Gait velocity interpretation: <1.31 ft/sec, indicative of household ambulator    Posture / Balance Dynamic Sitting Balance Sitting balance - Comments: min-modA due to posterior lean Balance Overall balance assessment: Needs assistance Sitting-balance support: No upper extremity supported,Feet supported Sitting balance-Leahy Scale: Fair Sitting balance -  Comments: min-modA due to posterior lean Standing balance support: Bilateral upper extremity supported Standing balance-Leahy Scale: Poor Standing balance comment: minA initially with BUE support of platform walker    Special needs/care consideration n/a     Previous Home Environment (from acute therapy documentation) Living Arrangements: Spouse/significant other  Lives With: Spouse Available Help at Discharge: Family Type of Home: House Home Layout: One level Home Access: Stairs to enter Entrance Stairs-Rails: None Entrance Stairs-Number of Steps: 1 (~9 inch step) Additional Comments: daughter currently present from [place] and will give care through holidays as needed. Daughter works as a Pharmacist, hospital. spouse is 38 yo and requires some assistance from daugther as well  Discharge Living Setting Plans for Discharge Living Setting: Patient's home Type of Home at Discharge: House Discharge Home Layout: One level Discharge Home Access: Stairs to enter Entrance Stairs-Rails: None Entrance Stairs-Number of Steps: 1 Discharge Bathroom Shower/Tub: Tub/shower unit Discharge Bathroom Toilet: Standard Discharge Bathroom Accessibility: Yes How Accessible: Accessible via walker Does the  patient have any problems obtaining your medications?: No  Social/Family/Support Systems Patient Roles: Spouse Anticipated Caregiver: spouse Lucila Maine) and daughter Lattie Haw, from [place]) Anticipated Caregiver's Contact Information: Lattie Haw [phone] Ability/Limitations of Caregiver: Lucila Maine can provide supervision only Caregiver Availability: 24/7 Discharge Plan Discussed with Primary Caregiver: Yes Is Caregiver In Agreement with Plan?: Yes Does Caregiver/Family have Issues with Lodging/Transportation while Pt is in Rehab?: No   Goals Patient/Family Goal for Rehab: PT/OT supervision to mod I, SLP independent Expected length of stay: 10-14 days Pt/Family Agrees to Admission and  willing to participate: Yes Program Orientation Provided & Reviewed with Pt/Caregiver Including Roles  & Responsibilities: Yes  Barriers to Discharge: Insurance for SNF coverage   Decrease burden of Care through IP rehab admission: n/a   Possible need for SNF placement upon discharge: Not anticipated  Patient Condition: This patient's medical and functional status has changed since the consult dated: 05/28/20 in which the Rehabilitation Physician determined and documented that the patient's condition is appropriate for intensive rehabilitative care in an inpatient rehabilitation facility. See "History of Present Illness" (above) for medical update. Functional changes are: pt min +2 with mobility and ADLs. Patient's medical and functional status update has been discussed with the Rehabilitation physician and patient remains appropriate for inpatient rehabilitation. Will admit to inpatient rehab Saturday.  Preadmission Screen Completed By:  Michel Santee, PT, 05/30/2020 12:58 PM ______________________________________________________________________   Discussed status with Dr. Dagoberto Ligas on 05/30/20 at 12:58 PM  and received approval for admission Saturday.  Admission Coordinator:  Michel Santee, PT, DPT time 12:58 PM Sudie Grumbling 05/30/20

## 2020-05-30 NOTE — Discharge Instructions (Signed)

## 2020-05-30 NOTE — Progress Notes (Signed)
Physical Therapy Treatment Patient Details Name: [name] MRN: [id number] DOB: April 04, 1941 Today's Date: 05/30/2020    History of Present Illness Pt is an 79 y/o female admitted following a fall and LLE numbness and weakness. Found to have L triquestral alvusion fx and is s/p splinting. 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 PMH includes kidney cancer.    PT Comments    Pt tolerates treatment well despite reports of new onset neck pain overnight. Pt continues to require frequent cues to maintain WB precautions to L wrist. Pt also requires assistance for all transfers and ambulation. Pt often requires encouragement to attempt tasks initially prior to requesting assistance from PT or family, family education on this topic will be beneficial at next session. Pt will continue to benefit from acute PT POC to improve mobility and aide in a return to independence. PT continues to recommend CIR admission.   Follow Up Recommendations  CIR     Equipment Recommendations  Other (comment) (L platform walker)    Recommendations for Other Services       Precautions / Restrictions Precautions Precautions: Fall Restrictions Weight Bearing Restrictions: Yes LUE Weight Bearing: Weight bear through elbow only Other Position/Activity Restrictions: Assumed NWB on L wrist    Mobility  Bed Mobility                  Transfers Overall transfer level: Needs assistance Equipment used: Left platform walker Transfers: Sit to/from Stand Sit to Stand: Min assist         General transfer comment: pt requires minA to power up, continues to require cues for hand placement to maintain WB precautions  Ambulation/Gait Ambulation/Gait assistance: Min assist Gait Distance (Feet): 60 Feet Assistive device: Left platform walker Gait Pattern/deviations: Step-to pattern Gait velocity: reduced Gait velocity interpretation: <1.8 ft/sec, indicate of risk for  recurrent falls General Gait Details: pt with step-to gait, continues to demonstrate some L foot drag with fatigue. PT encourages looking upright during ambulation as the pt has a tendency to look down toward feet   Stairs             Wheelchair Mobility    Modified Rankin (Stroke Patients Only) Modified Rankin (Stroke Patients Only) Pre-Morbid Rankin Score: No symptoms Modified Rankin: Moderately severe disability     Balance Overall balance assessment: Needs assistance Sitting-balance support: No upper extremity supported;Feet supported Sitting balance-Leahy Scale: Fair     Standing balance support: Bilateral upper extremity supported Standing balance-Leahy Scale: Poor Standing balance comment: reliant on UE support of RW                            Cognition Arousal/Alertness: Awake/alert Behavior During Therapy: WFL for tasks assessed/performed Overall Cognitive Status: Impaired/Different from baseline Area of Impairment: Safety/judgement;Awareness                     Memory: Decreased recall of precautions   Safety/Judgement: Decreased awareness of deficits            Exercises      General Comments General comments (skin integrity, edema, etc.): VSS on RA      Pertinent Vitals/Pain Pain Assessment: Faces Faces Pain Scale: Hurts even more Pain Location: neck Pain Descriptors / Indicators: Grimacing Pain Intervention(s): Monitored during session    Home Living  Prior Function            PT Goals (current goals can now be found in the care plan section) Acute Rehab PT Goals Patient Stated Goal: to go home Progress towards PT goals: Progressing toward goals    Frequency    Min 4X/week      PT Plan Current plan remains appropriate    Co-evaluation              AM-PAC PT "6 Clicks" Mobility   Outcome Measure  Help needed turning from your back to your side while in a flat bed  without using bedrails?: A Little Help needed moving from lying on your back to sitting on the side of a flat bed without using bedrails?: A Lot Help needed moving to and from a bed to a chair (including a wheelchair)?: A Little Help needed standing up from a chair using your arms (e.g., wheelchair or bedside chair)?: A Little Help needed to walk in hospital room?: A Little Help needed climbing 3-5 steps with a railing? : A Lot 6 Click Score: 16    End of Session   Activity Tolerance: Patient tolerated treatment well Patient left: in chair;with call bell/phone within reach;with chair alarm set;with family/visitor present Nurse Communication: Mobility status PT Visit Diagnosis: Unsteadiness on feet (R26.81);History of falling (Z91.81);Muscle weakness (generalized) (M62.81)     Time: 5009-3818 PT Time Calculation (min) (ACUTE ONLY): 29 min  Charges:  $Gait Training: 8-22 mins $Therapeutic Activity: 8-22 mins                     Zenaida Niece, PT, DPT Acute Rehabilitation Pager: [phone]    Zenaida Niece 05/30/2020, 2:05 PM

## 2020-05-30 NOTE — Progress Notes (Addendum)
I have seen the patient and discussed loop recorder with her.   She is willing to proceed and is an appropriate candidate for loop recorder pending discharge timing. Full note to follow pending discharge timing.   Legrand Como [address]" Justice, PA-C  05/30/2020 11:41 AM

## 2020-05-30 NOTE — Plan of Care (Signed)
  Problem: Education: Goal: Knowledge of General Education information will improve Description: Including pain rating scale, medication(s)/side effects and non-pharmacologic comfort measures Outcome: Progressing   Problem: Health Behavior/Discharge Planning: Goal: Ability to manage health-related needs will improve Outcome: Progressing   Problem: Clinical Measurements: Goal: Ability to maintain clinical measurements within normal limits will improve Outcome: Progressing Goal: Will remain free from infection Outcome: Progressing Goal: Diagnostic test results will improve Outcome: Progressing Goal: Respiratory complications will improve Outcome: Progressing Goal: Cardiovascular complication will be avoided Outcome: Progressing   Problem: Activity: Goal: Risk for activity intolerance will decrease Outcome: Progressing   Problem: Nutrition: Goal: Adequate nutrition will be maintained Outcome: Progressing   Problem: Coping: Goal: Level of anxiety will decrease Outcome: Progressing   Problem: Elimination: Goal: Will not experience complications related to bowel motility Outcome: Progressing Goal: Will not experience complications related to urinary retention Outcome: Progressing   Problem: Pain Managment: Goal: General experience of comfort will improve Outcome: Progressing   Problem: Safety: Goal: Ability to remain free from injury will improve Outcome: Progressing   Problem: Skin Integrity: Goal: Risk for impaired skin integrity will decrease Outcome: Progressing   Problem: Education: Goal: Knowledge of secondary prevention will improve Outcome: Progressing Goal: Knowledge of patient specific risk factors addressed and post discharge goals established will improve Outcome: Progressing   Problem: Coping: Goal: Will verbalize positive feelings about self Outcome: Progressing Goal: Will identify appropriate support needs Outcome: Progressing   Problem: Health  Behavior/Discharge Planning: Goal: Ability to manage health-related needs will improve Outcome: Progressing   Problem: Self-Care: Goal: Ability to communicate needs accurately will improve Outcome: Progressing   Problem: Nutrition: Goal: Risk of aspiration will decrease Outcome: Progressing   Problem: Intracerebral Hemorrhage Tissue Perfusion: Goal: Complications of Intracerebral Hemorrhage will be minimized Outcome: Progressing   Problem: Ischemic Stroke/TIA Tissue Perfusion: Goal: Complications of ischemic stroke/TIA will be minimized Outcome: Progressing

## 2020-05-31 ENCOUNTER — Inpatient Hospital Stay (HOSPITAL_COMMUNITY)
Admit: 2020-05-31 | Discharge: 2020-06-06 | DRG: 057 | Disposition: A | Discharge status: Home / Self Care | Payer: PPO | Source: Intra-hospital | Attending: Physical Medicine & Rehabilitation | Admitting: Physical Medicine & Rehabilitation

## 2020-05-31 ENCOUNTER — Encounter (HOSPITAL_COMMUNITY): Payer: Self-pay | Admitting: Physical Medicine & Rehabilitation

## 2020-05-31 ENCOUNTER — Other Ambulatory Visit: Payer: Self-pay

## 2020-05-31 DIAGNOSIS — Z7982 Long term (current) use of aspirin: Secondary | ICD-10-CM | POA: Diagnosis not present

## 2020-05-31 DIAGNOSIS — K219 Gastro-esophageal reflux disease without esophagitis: Secondary | ICD-10-CM | POA: Diagnosis not present

## 2020-05-31 DIAGNOSIS — I6350 Cerebral infarction due to unspecified occlusion or stenosis of unspecified cerebral artery: Secondary | ICD-10-CM | POA: Diagnosis not present

## 2020-05-31 DIAGNOSIS — K5900 Constipation, unspecified: Secondary | ICD-10-CM

## 2020-05-31 DIAGNOSIS — I639 Cerebral infarction, unspecified: Secondary | ICD-10-CM | POA: Diagnosis not present

## 2020-05-31 DIAGNOSIS — Z90710 Acquired absence of both cervix and uterus: Secondary | ICD-10-CM

## 2020-05-31 DIAGNOSIS — S62112D Displaced fracture of triquetrum [cuneiform] bone, left wrist, subsequent encounter for fracture with routine healing: Secondary | ICD-10-CM

## 2020-05-31 DIAGNOSIS — S62102A Fracture of unspecified carpal bone, left wrist, initial encounter for closed fracture: Secondary | ICD-10-CM | POA: Diagnosis not present

## 2020-05-31 DIAGNOSIS — Z8249 Family history of ischemic heart disease and other diseases of the circulatory system: Secondary | ICD-10-CM | POA: Diagnosis not present

## 2020-05-31 DIAGNOSIS — I63031 Cerebral infarction due to thrombosis of right carotid artery: Secondary | ICD-10-CM | POA: Diagnosis not present

## 2020-05-31 DIAGNOSIS — Z905 Acquired absence of kidney: Secondary | ICD-10-CM | POA: Diagnosis not present

## 2020-05-31 DIAGNOSIS — M2170 Unequal limb length (acquired), unspecified site: Secondary | ICD-10-CM | POA: Diagnosis present

## 2020-05-31 DIAGNOSIS — Z85528 Personal history of other malignant neoplasm of kidney: Secondary | ICD-10-CM | POA: Diagnosis not present

## 2020-05-31 DIAGNOSIS — E785 Hyperlipidemia, unspecified: Secondary | ICD-10-CM | POA: Diagnosis present

## 2020-05-31 DIAGNOSIS — I6300 Cerebral infarction due to thrombosis of unspecified precerebral artery: Secondary | ICD-10-CM | POA: Diagnosis not present

## 2020-05-31 DIAGNOSIS — K5909 Other constipation: Secondary | ICD-10-CM | POA: Diagnosis present

## 2020-05-31 DIAGNOSIS — M542 Cervicalgia: Secondary | ICD-10-CM | POA: Diagnosis present

## 2020-05-31 DIAGNOSIS — G4700 Insomnia, unspecified: Secondary | ICD-10-CM | POA: Diagnosis not present

## 2020-05-31 DIAGNOSIS — G8929 Other chronic pain: Secondary | ICD-10-CM

## 2020-05-31 DIAGNOSIS — Z885 Allergy status to narcotic agent status: Secondary | ICD-10-CM | POA: Diagnosis not present

## 2020-05-31 DIAGNOSIS — D329 Benign neoplasm of meninges, unspecified: Secondary | ICD-10-CM | POA: Diagnosis not present

## 2020-05-31 DIAGNOSIS — M25511 Pain in right shoulder: Secondary | ICD-10-CM | POA: Diagnosis not present

## 2020-05-31 DIAGNOSIS — W19XXXD Unspecified fall, subsequent encounter: Secondary | ICD-10-CM | POA: Diagnosis present

## 2020-05-31 DIAGNOSIS — Z79899 Other long term (current) drug therapy: Secondary | ICD-10-CM

## 2020-05-31 DIAGNOSIS — S6292XA Unspecified fracture of left wrist and hand, initial encounter for closed fracture: Secondary | ICD-10-CM | POA: Diagnosis not present

## 2020-05-31 DIAGNOSIS — I69354 Hemiplegia and hemiparesis following cerebral infarction affecting left non-dominant side: Principal | ICD-10-CM

## 2020-05-31 DIAGNOSIS — Z800 Family history of malignant neoplasm of digestive organs: Secondary | ICD-10-CM | POA: Diagnosis not present

## 2020-05-31 DIAGNOSIS — G629 Polyneuropathy, unspecified: Secondary | ICD-10-CM | POA: Diagnosis present

## 2020-05-31 DIAGNOSIS — J029 Acute pharyngitis, unspecified: Secondary | ICD-10-CM | POA: Diagnosis not present

## 2020-05-31 DIAGNOSIS — I10 Essential (primary) hypertension: Secondary | ICD-10-CM | POA: Diagnosis not present

## 2020-05-31 DIAGNOSIS — S62113A Displaced fracture of triquetrum [cuneiform] bone, unspecified wrist, initial encounter for closed fracture: Secondary | ICD-10-CM | POA: Diagnosis present

## 2020-05-31 DIAGNOSIS — Z7902 Long term (current) use of antithrombotics/antiplatelets: Secondary | ICD-10-CM | POA: Diagnosis not present

## 2020-05-31 MED ORDER — ACETAMINOPHEN 325 MG PO TABS
325.0000 mg | ORAL_TABLET | ORAL | Status: DC | PRN
  Administered 2020-05-31 – 2020-06-04 (×6): 650 mg via ORAL
  Filled 2020-05-31 (×6): qty 2

## 2020-05-31 MED ORDER — PROCHLORPERAZINE MALEATE 5 MG PO TABS: 5.0000 mg | ORAL_TABLET | Freq: Four times a day (QID) | ORAL | Status: DC | PRN

## 2020-05-31 MED ORDER — CYCLOBENZAPRINE HCL 5 MG PO TABS: 5.0000 mg | ORAL_TABLET | Freq: Three times a day (TID) | ORAL | 0 refills | Status: DC | PRN

## 2020-05-31 MED ORDER — CLOPIDOGREL BISULFATE 75 MG PO TABS: 75.0000 mg | ORAL_TABLET | Freq: Every day | ORAL | 0 refills | Status: DC

## 2020-05-31 MED ORDER — POLYETHYLENE GLYCOL 3350 17 G PO PACK
17.0000 g | PACK | Freq: Every day | ORAL | Status: DC
  Administered 2020-06-01 – 2020-06-06 (×5): 17 g via ORAL
  Filled 2020-05-31 (×5): qty 1

## 2020-05-31 MED ORDER — TRAMADOL HCL 50 MG PO TABS: 50.0000 mg | ORAL_TABLET | Freq: Four times a day (QID) | ORAL | 0 refills | Status: DC | PRN

## 2020-05-31 MED ORDER — POLYETHYLENE GLYCOL 3350 17 G PO PACK
17.0000 g | PACK | Freq: Every day | ORAL | Status: DC | PRN
Start: 2020-05-31 — End: 2020-06-06

## 2020-05-31 MED ORDER — PANTOPRAZOLE SODIUM 40 MG PO TBEC
40.0000 mg | DELAYED_RELEASE_TABLET | Freq: Every day | ORAL | Status: DC
  Administered 2020-06-01 – 2020-06-06 (×6): 40 mg via ORAL
  Filled 2020-05-31 (×6): qty 1

## 2020-05-31 MED ORDER — ATORVASTATIN CALCIUM 40 MG PO TABS: 40.0000 mg | ORAL_TABLET | Freq: Every day | ORAL | 0 refills | Status: DC

## 2020-05-31 MED ORDER — CYCLOBENZAPRINE HCL 10 MG PO TABS
5.0000 mg | ORAL_TABLET | Freq: Three times a day (TID) | ORAL | Status: DC
  Administered 2020-05-31: 5 mg via ORAL
  Filled 2020-05-31: qty 1

## 2020-05-31 MED ORDER — ALUM & MAG HYDROXIDE-SIMETH 200-200-20 MG/5ML PO SUSP: 30.0000 mL | ORAL | Status: DC | PRN

## 2020-05-31 MED ORDER — ASPIRIN 81 MG PO TBEC: 81.0000 mg | DELAYED_RELEASE_TABLET | Freq: Every day | ORAL | 0 refills | Status: DC

## 2020-05-31 MED ORDER — ATORVASTATIN CALCIUM 40 MG PO TABS
40.0000 mg | ORAL_TABLET | Freq: Every day | ORAL | Status: DC
  Administered 2020-06-01 – 2020-06-06 (×6): 40 mg via ORAL
  Filled 2020-05-31 (×6): qty 1

## 2020-05-31 MED ORDER — BISACODYL 10 MG RE SUPP
10.0000 mg | Freq: Every day | RECTAL | Status: DC | PRN
  Administered 2020-06-03: 10 mg via RECTAL

## 2020-05-31 MED ORDER — CLOPIDOGREL BISULFATE 75 MG PO TABS
75.0000 mg | ORAL_TABLET | Freq: Every day | ORAL | Status: DC
  Administered 2020-06-01 – 2020-06-06 (×6): 75 mg via ORAL
  Filled 2020-05-31 (×6): qty 1

## 2020-05-31 MED ORDER — ENOXAPARIN SODIUM 40 MG/0.4ML ~~LOC~~ SOLN
40.0000 mg | SUBCUTANEOUS | Status: DC
  Administered 2020-05-31 – 2020-06-05 (×6): 40 mg via SUBCUTANEOUS
  Filled 2020-05-31 (×7): qty 0.4

## 2020-05-31 MED ORDER — DIPHENHYDRAMINE HCL 12.5 MG/5ML PO ELIX
12.5000 mg | ORAL_SOLUTION | Freq: Four times a day (QID) | ORAL | Status: DC | PRN
  Administered 2020-06-01 – 2020-06-03 (×3): 25 mg via ORAL
  Filled 2020-05-31 (×3): qty 10

## 2020-05-31 MED ORDER — SENNOSIDES-DOCUSATE SODIUM 8.6-50 MG PO TABS
1.0000 | ORAL_TABLET | Freq: Two times a day (BID) | ORAL | Status: DC
  Administered 2020-05-31 – 2020-06-06 (×12): 1 via ORAL
  Filled 2020-05-31 (×12): qty 1

## 2020-05-31 MED ORDER — PROCHLORPERAZINE 25 MG RE SUPP: 12.5000 mg | Freq: Four times a day (QID) | RECTAL | Status: DC | PRN

## 2020-05-31 MED ORDER — POLYETHYLENE GLYCOL 3350 17 G PO PACK: 17.0000 g | PACK | Freq: Every day | ORAL | 0 refills | Status: DC

## 2020-05-31 MED ORDER — PROCHLORPERAZINE EDISYLATE 10 MG/2ML IJ SOLN: 5.0000 mg | Freq: Four times a day (QID) | INTRAMUSCULAR | Status: DC | PRN

## 2020-05-31 MED ORDER — TRAMADOL HCL 50 MG PO TABS: 50.0000 mg | ORAL_TABLET | Freq: Four times a day (QID) | ORAL | Status: DC | PRN

## 2020-05-31 MED ORDER — FLEET ENEMA 7-19 GM/118ML RE ENEM: 1.0000 | ENEMA | Freq: Once | RECTAL | Status: DC | PRN

## 2020-05-31 MED ORDER — SORBITOL 70 % SOLN
30.0000 mL | Freq: Once | Status: DC
  Filled 2020-05-31 (×2): qty 30

## 2020-05-31 MED ORDER — ASPIRIN EC 81 MG PO TBEC
81.0000 mg | DELAYED_RELEASE_TABLET | Freq: Every day | ORAL | Status: DC
  Administered 2020-06-01 – 2020-06-06 (×6): 81 mg via ORAL
  Filled 2020-05-31 (×6): qty 1

## 2020-05-31 MED ORDER — GUAIFENESIN-DM 100-10 MG/5ML PO SYRP
5.0000 mL | ORAL_SOLUTION | Freq: Four times a day (QID) | ORAL | Status: DC | PRN
  Administered 2020-06-01: 10 mL via ORAL
  Filled 2020-05-31: qty 10

## 2020-05-31 MED ORDER — TRAZODONE HCL 50 MG PO TABS: 25.0000 mg | ORAL_TABLET | Freq: Every evening | ORAL | Status: DC | PRN

## 2020-05-31 MED ORDER — SENNOSIDES-DOCUSATE SODIUM 8.6-50 MG PO TABS: 1.0000 | ORAL_TABLET | Freq: Two times a day (BID) | ORAL | 0 refills | Status: AC

## 2020-05-31 MED ORDER — CYCLOBENZAPRINE HCL 5 MG PO TABS
5.0000 mg | ORAL_TABLET | Freq: Three times a day (TID) | ORAL | Status: DC | PRN
  Administered 2020-05-31 – 2020-06-05 (×5): 5 mg via ORAL
  Filled 2020-05-31 (×7): qty 1

## 2020-05-31 NOTE — Discharge Summary (Signed)
[facility] Discharge Summary   Patient: [name]:[id number]  PCP: Hulan Fess, MD  Date of admission: 05/26/2020   Date of discharge:  05/31/2020     Discharge Diagnoses:  Principal Problem:   Cerebral ischemia Active Problems:   Acute CVA (cerebrovascular accident) ([place])   Left leg numbness   GERD (gastroesophageal reflux disease)   Left leg weakness   Triquetral fracture  Admitted From: home Disposition:  CIR   Recommendations for Outpatient Follow-up:  1. PCP: please follow up with PCP and neurology as recommended 2. Follow up LABS/TEST: None   Follow-up Information    [facility]. Schedule an appointment as soon as possible for a visit in 4 week(s).   Specialty: Neurology Contact information: [address] [place] [phone]       Hulan Fess, MD. Schedule an appointment as soon as possible for a visit in 1 week(s).   Specialty: Family Medicine Contact information: [place] [address] [phone]              Diet recommendation: Cardiac diet  Activity: The patient is advised to gradually reintroduce usual activities, as tolerated  Discharge Condition: stable  Code Status: Full code   History of present illness: As per the H and P dictated on admission, "[name] is a 79 y.o. female with medical history significant hip fracture at age of 26, chronic pain syndrome, kidney cancer s/p right nephrectomy (per patient) who presents to the emergency department via EMS due to 2 to 3-day onset of left leg numbness (from knee to foot).  Patient sustained a fall yesterday which resulted in a left arm injury that required a volar splint.  At baseline, patient has leg length difference with left leg being shorter than right and she uses an orthotic heel lifts shoe. This morning, patient states that the leg numbness was worse to the extent that she was unable to use  husband's walker to ambulate, so EMS was activated.  Patient was taken to the ED for further evaluation, patient was noted dragging left foot when walking by ED medical record. She denies facial droop, slurred speech, headache, chest pain or shortness of breath"  Hospital Course:  Summary of her active problems in the hospital is as following. 1.Multifocal acute ischemia in the right ACA territory likely embolic in nature CT head shows chronic right cerebellar infarct. MRI brain shows multifocal ischemic lesions on the right hemisphere in the [place] territory. MRA shows right ACA occlusion. Echocardiogram shows 60 to 65% EF. No emboli in the cardiac echocardiogram. Dopplers negative for DVT. SVT/PAC on telemetry so far, no A. fib. Loop recorder on 12/17 LDL 143 not on any medication prior to admission. Currently on 81 mg aspirin daily +75 mg Plavix daily. DAPT for 3 months followed by aspirin alone. PT OT recommends CIR. Passedswallowing evaluation.  2.Hyperlipidemia LDL 143. Currently on 40 mg daily Lipitor. Continue.  3.Fall Left wrist fracture Fall occurred secondary to stroke. X-ray shows triquetral avulsion fracture. Currently splint splint. She will need orthopedic follow-up pain 7 to 10 days after discharge for this injury Pain tolerable with Tylenol but frequency is not enough. We will add tramadol.  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 Appreciate orthopedics for shoulder injection.  5.  Neck pain. As needed Robaxin.  6.  Constipation. Continue bowel regimen.  Pain control  - [facility] Controlled Substance Reporting  System database was reviewed. - 5 day supply was provided. - Patient was instructed, not to drive, operate heavy machinery, perform activities at heights, swimming or participation in water activities or provide baby sitting services while on Pain, Sleep and Anxiety  Medications; until her outpatient Physician has advised to do so again.  - Also recommended to not to take more than prescribed Pain, Sleep and Anxiety Medications.  Patient was seen by physical therapy, who recommended CIR,  was arranged. On the day of the discharge the patient's vitals were stable, and no other acute medical condition were reported by patient. The patient was felt safe to be discharge at [facility] with therapy.  Consultants: Neurology Procedures: Echocardiogram  Loop recorder insertion  Discharge Exam: General: Appear in no distress, no Rash; Oral Mucosa Clear, moist. no Abnormal Neck Mass Or lumps, Conjunctiva normal  Cardiovascular: S1 and S2 Present, no Murmur Respiratory: good respiratory effort, Bilateral Air entry present and CTA, no Crackles, no wheezes Abdomen: Bowel Sound present, Soft and no tenderness Extremities: no Pedal edema Neurology: alert and oriented to time, place, and person affect appropriate. no new focal deficit  There were no vitals filed for this visit. Vitals:   05/31/20 0751 05/31/20 1149  BP: 105/78 (!) 129/55  Pulse: 71 66  Resp: 18 18  Temp: 98.2 F (36.8 C) 97.8 F (36.6 C)  SpO2: 96% 96%    DISCHARGE MEDICATION: Allergies as of 05/31/2020      Reactions   Codeine Other (See Comments)   Pt unsure if really allergic to this      Medication List    STOP taking these medications   estradiol 0.5 MG tablet Commonly known as: ESTRACE     TAKE these medications   acetaminophen 650 MG CR tablet Commonly known as: TYLENOL Take 650 mg by mouth daily.   aspirin 81 MG EC tablet Take 1 tablet (81 mg total) by mouth daily. Swallow whole. Start taking on: June 01, 2020   atorvastatin 40 MG tablet Commonly known as: LIPITOR Take 1 tablet (40 mg total) by mouth daily. Start taking on: June 01, 2020   clopidogrel 75 MG tablet Commonly known as: PLAVIX Take 1 tablet (75 mg total) by mouth daily. Start taking on: June 01, 2020   cyclobenzaprine 5 MG tablet Commonly known as: FLEXERIL Take 1 tablet (5 mg total) by mouth 3 (three) times daily as needed for muscle spasms.   MAGNESIUM PO Take 1 tablet by mouth every evening.   multivitamin with minerals tablet Take 1 tablet by mouth daily.   omeprazole 40 MG capsule Commonly known as: PRILOSEC Take 40 mg by mouth daily.   polyethylene glycol 17 g packet Commonly known as: MIRALAX / GLYCOLAX Take 17 g by mouth daily. Start taking on: June 01, 2020   senna-docusate 8.6-50 MG tablet Commonly known as: Senokot-S Take 1 tablet by mouth 2 (two) times daily.   traMADol 50 MG tablet Commonly known as: ULTRAM Take 1 tablet (50 mg total) by mouth every 6 (six) hours as needed for severe pain or moderate pain.   vitamin C 500 MG tablet Commonly known as: ASCORBIC ACID Take 500 mg by mouth daily.      Allergies  Allergen Reactions  . Codeine Other (See Comments)    Pt unsure if really allergic to this   Discharge Instructions    Ambulatory referral to Neurology   Complete by: As directed    Follow up with stroke clinic NP (  Venancio Poisson or Cecille Rubin, if both not available, consider Zachery Dauer, or Ahern) at [facility] in about 4 weeks. Thanks.   Diet - low sodium heart healthy   Complete by: As directed    Increase activity slowly   Complete by: As directed       The results of significant diagnostics from this hospitalization (including imaging, microbiology, ancillary and laboratory) are listed below for reference.    Significant Diagnostic Studies: DG Wrist Complete Left  Result Date: 05/26/2020 CLINICAL DATA:  Fall, pain EXAM: LEFT WRIST - COMPLETE 3+ VIEW COMPARISON:  None. FINDINGS: There is a small fragment dorsal to the mid carpus seen on lateral view. No other fracture or dislocation. Thumb basal arthrosis. Dorsal soft tissue edema. IMPRESSION: There is a small fragment dorsal to the mid carpus seen on lateral view,  suspicious for triquetral avulsion fracture. No other fracture or dislocation. Dorsal soft tissue edema. Electronically Signed   By: Eddie Candle M.D.   On: 05/26/2020 15:47   CT HEAD WO CONTRAST  Result Date: 05/26/2020 CLINICAL DATA:  Left leg numbness EXAM: CT HEAD WITHOUT CONTRAST TECHNIQUE: Contiguous axial images were obtained from the base of the skull through the vertex without intravenous contrast. COMPARISON:  None. FINDINGS: Brain: No acute territorial infarction or hemorrhage is visualized. 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 Chronic infarct in the right cerebellum. Nonenlarged ventricles. Mild white matter hypodensity likely chronic small vessel ischemic change Vascular: No hyperdense vessels.  Carotid vascular calcification Skull: Normal. Negative for fracture Sinuses/Orbits: No acute finding. Other: None IMPRESSION: 1. No CT evidence for acute intracranial abnormality. Mild chronic small vessel ischemic change of the white matter. 2. 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 3. Chronic right cerebellar infarct. Electronically Signed   By: Donavan Foil M.D.   On: 05/26/2020 16:47   MR ANGIO HEAD WO CONTRAST  Result Date: 05/27/2020 CLINICAL DATA:  Left leg numbness, abnormal MRI brain EXAM: MRA HEAD WITHOUT CONTRAST TECHNIQUE: Angiographic images of the Circle of Willis were obtained using MRA technique without intravenous contrast. COMPARISON:  None. FINDINGS: Intracranial internal carotid arteries are patent with atherosclerotic irregularity and moderate stenosis of the right supraclinoid ICA. Proximal middle cerebral arteries are patent. There is occlusion of the right A2 ACA. Included intracranial vertebral arteries, basilar artery, posterior cerebral arteries  are patent. IMPRESSION: Right A2 ACA occlusion. Areas of infarction on prior study could reflect watershed ischemia. Moderate right supraclinoid ICA stenosis. Electronically Signed   By: Macy Mis M.D.   On: 05/27/2020 11:38   MR ANGIO NECK W WO CONTRAST  Result Date: 05/27/2020 CLINICAL DATA:  Left leg numbness EXAM: MRI HEAD WITHOUT AND WITH CONTRAST MRA NECK WITHOUT AND WITH CONTRAST TECHNIQUE: Multiplanar, multiecho pulse sequences of the brain and surrounding structures were obtained without and with intravenous contrast. Angiographic images of the neck were obtained using MRA technique with and without intravenous contrast. Carotid stenosis measurements (when applicable) are obtained utilizing NASCET criteria, using the distal internal carotid diameter as the denominator. CONTRAST:  75mL GADAVIST GADOBUTROL 1 MMOL/ML IV SOLN COMPARISON:  None. FINDINGS: MRI HEAD FINDINGS Brain: Multifocal abnormal diffusion restriction within the right hemisphere, predominantly within the anterior frontal lobe and the medial parietal lobe. No acute or chronic hemorrhage. There is multifocal hyperintense T2-weighted signal  within the white matter. Parenchymal volume and CSF spaces are normal. There are old bilateral cerebellar infarcts. 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 Vascular: Major flow voids are preserved. Skull and upper cervical spine: Normal calvarium and skull base. Visualized upper cervical spine and soft tissues are normal. Sinuses/Orbits:No paranasal sinus fluid levels or advanced mucosal thickening. No mastoid or middle ear effusion. Normal orbits. MRA NECK FINDINGS Normal carotid and vertebral arteries. IMPRESSION: 1. Multifocal acute ischemia within the right hemisphere, predominantly within the anterior frontal lobe and medial parietal lobe. No hemorrhage or mass effect. 2. 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 3. Normal MRA of the neck. Electronically  Signed   By: Ulyses Jarred M.D.   On: 05/27/2020 01:16   MR BRAIN W WO CONTRAST  Result Date: 05/27/2020 CLINICAL DATA:  Left leg numbness EXAM: MRI HEAD WITHOUT AND WITH CONTRAST MRA NECK WITHOUT AND WITH CONTRAST TECHNIQUE: Multiplanar, multiecho pulse sequences of the brain and surrounding structures were obtained without and with intravenous contrast. Angiographic images of the neck were obtained using MRA technique with and without intravenous contrast. Carotid stenosis measurements (when applicable) are obtained utilizing NASCET criteria, using the distal internal carotid diameter as the denominator. CONTRAST:  70mL GADAVIST GADOBUTROL 1 MMOL/ML IV SOLN COMPARISON:  None. FINDINGS: MRI HEAD FINDINGS Brain: Multifocal abnormal diffusion restriction within the right hemisphere, predominantly within the anterior frontal lobe and the medial parietal lobe. No acute or chronic hemorrhage. There is multifocal hyperintense T2-weighted signal within the white matter. Parenchymal volume and CSF spaces are normal. There are old bilateral cerebellar infarcts. 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 Vascular: Major flow voids are preserved. Skull and upper cervical spine: Normal calvarium and skull base. Visualized upper cervical spine and soft tissues are normal. Sinuses/Orbits:No paranasal sinus fluid levels or advanced mucosal thickening. No mastoid or middle ear effusion. Normal orbits. MRA NECK FINDINGS Normal carotid and vertebral arteries. IMPRESSION: 1. Multifocal acute ischemia within the right hemisphere, predominantly within the anterior frontal lobe and medial parietal lobe. No hemorrhage or mass effect. 2. 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 3. Normal MRA of the neck. Electronically Signed   By: Ulyses Jarred M.D.   On: 05/27/2020 01:16   DG Shoulder Left Port  Result Date: 05/28/2020 CLINICAL DATA:  Left shoulder pain after fall. EXAM: LEFT SHOULDER  COMPARISON:  None. FINDINGS: There is no evidence of fracture or dislocation. There is no evidence of arthropathy or other focal bone abnormality. Soft tissues are unremarkable. IMPRESSION: Negative. Electronically Signed   By: Marijo Conception M.D.   On: 05/28/2020 15:39   DG Shoulder Right Port  Result Date: 05/28/2020 CLINICAL DATA:  Chronic right shoulder pain. EXAM: PORTABLE RIGHT SHOULDER COMPARISON:  None. FINDINGS: There is no evidence of fracture or dislocation. Moderate degenerative changes seen involving the right acromioclavicular joint. Soft tissues are unremarkable. 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 No acute abnormality seen in the right shoulder. Electronically Signed   By: Marijo Conception M.D.   On: 05/28/2020 15:39   DG Swallowing Func-Speech Pathology  Result Date: 05/29/2020 Objective Swallowing Evaluation: Type of Study: Bedside Swallow Evaluation  Patient Details Name: [name] MRN: [id number] Date of Birth: 1941/06/09 Today's Date: 05/29/2020 Time: SLP Start Time (ACUTE ONLY): 0930 -SLP Stop Time (ACUTE ONLY): 0945 SLP Time Calculation (min) (ACUTE ONLY): 15 min Past Medical History: Past Medical History: Diagnosis Date . Blood transfusion  without reported diagnosis   with hip fracture-79 years old . Cancer [facility]) 1983  Kidney Cancer . Diverticulosis  . GERD (gastroesophageal reflux disease)  . Personal history of kidney cancer  Past Surgical History: Past Surgical History: Procedure Laterality Date . ABDOMINAL HYSTERECTOMY   . COLON SURGERY    Severe sigmoid diverticulitis . COLONOSCOPY   . FOOT SURGERY   . HIP SURGERY   . Kidney cancer    Kidney removed . POLYPECTOMY   HPI: Pt is an 79 y.o. female with medical history significant hip fracture at age of 1, chronic pain syndrome, kidney cancer s/p right nephrectomy (per patient) who presented to the ED due to 2 to 3-day onset of left leg numbness (from knee to foot).  MRI brain:  Multifocal acute ischemia within the right hemisphere, predominantly within the anterior frontal lobe and medial parietal lobe. No hemorrhage or mass effect.  No data recorded Assessment / Plan / Recommendation CHL IP CLINICAL IMPRESSIONS 05/29/2020 Clinical Impression Pt was seen in radiology suite for modified barium swallow study. Trials of puree solids, regular texture solids, a 40mm barium tablet, and thin liquids via cup and straw were administered. Pt's oropharyngeal swallow mechanism was within normal limits. Throat clearing and a single cough were intermittently noted during the study; however, no instances of penetration or aspiration were demonstrated. It is recommended that a regular texture diet with thin liquids be continued at this time. Further skilled SLP services are not clinically indicated for swallowing, but SLP will follow for cognitive-linguistic treatment. SLP Visit Diagnosis Dysphagia, unspecified (R13.10) Attention and concentration deficit following -- Frontal lobe and executive function deficit following -- Impact on safety and function No limitations   CHL IP TREATMENT RECOMMENDATION 05/29/2020 Treatment Recommendations No treatment recommended at this time   No flowsheet data found. CHL IP DIET RECOMMENDATION 05/29/2020 SLP Diet Recommendations Regular solids;Thin liquid Liquid Administration via Cup;Straw Medication Administration Whole meds with liquid Compensations -- Postural Changes Seated upright at 90 degrees   CHL IP OTHER RECOMMENDATIONS 05/29/2020 Recommended Consults -- Oral Care Recommendations Oral care BID Other Recommendations --   CHL IP FOLLOW UP RECOMMENDATIONS 05/29/2020 Follow up Recommendations Inpatient Rehab   CHL IP FREQUENCY AND DURATION 05/29/2020 Speech Therapy Frequency (ACUTE ONLY) min 2x/week Treatment Duration 2 weeks      CHL IP ORAL PHASE 05/29/2020 Oral Phase WFL Oral - Pudding Teaspoon -- Oral - Pudding Cup -- Oral - Honey Teaspoon -- Oral - Honey Cup  -- Oral - Nectar Teaspoon -- Oral - Nectar Cup -- Oral - Nectar Straw -- Oral - Thin Teaspoon -- Oral - Thin Cup -- Oral - Thin Straw -- Oral - Puree -- Oral - Mech Soft -- Oral - Regular -- Oral - Multi-Consistency -- Oral - Pill -- Oral Phase - Comment --  CHL IP PHARYNGEAL PHASE 05/29/2020 Pharyngeal Phase WFL Pharyngeal- Pudding Teaspoon -- Pharyngeal -- Pharyngeal- Pudding Cup -- Pharyngeal -- Pharyngeal- Honey Teaspoon -- Pharyngeal -- Pharyngeal- Honey Cup -- Pharyngeal -- Pharyngeal- Nectar Teaspoon -- Pharyngeal -- Pharyngeal- Nectar Cup -- Pharyngeal -- Pharyngeal- Nectar Straw -- Pharyngeal -- Pharyngeal- Thin Teaspoon -- Pharyngeal -- Pharyngeal- Thin Cup -- Pharyngeal -- Pharyngeal- Thin Straw -- Pharyngeal -- Pharyngeal- Puree -- Pharyngeal -- Pharyngeal- Mechanical Soft -- Pharyngeal -- Pharyngeal- Regular -- Pharyngeal -- Pharyngeal- Multi-consistency -- Pharyngeal -- Pharyngeal- Pill -- Pharyngeal -- Pharyngeal Comment --  CHL IP CERVICAL ESOPHAGEAL PHASE 05/29/2020 Cervical Esophageal Phase WFL Pudding Teaspoon -- Pudding Cup -- Honey Teaspoon --  Honey Cup -- Nectar Teaspoon -- Nectar Cup -- Nectar Straw -- Thin Teaspoon -- Thin Cup -- Thin Straw -- Puree -- Mechanical Soft -- Regular -- Multi-consistency -- Pill -- Cervical Esophageal Comment -- Shanika I. Hardin Negus, [place], [place] Office number [phone] Pager [phone] Horton Marshall 05/29/2020, 10:24 AM              ECHOCARDIOGRAM COMPLETE  Result Date: 05/27/2020    ECHOCARDIOGRAM REPORT   Patient Name:   [name] Date of Exam: 05/27/2020 Medical Rec #:  [id number]      Height:       64.5 in Accession #:    [id number]     Weight:       155.0 lb Date of Birth:  [birth year]      BSA:          1.765 m Patient Age:    34 years       BP:           155/75 mmHg Patient Gender: F              HR:           67 bpm. Exam Location:  Inpatient Procedure: 2D Echo Indications:    stroke 434.91  History:         Patient has no prior history of Echocardiogram examinations.  Sonographer:    Johny Chess Referring Phys: [id number] OLADAPO ADEFESO IMPRESSIONS  1. Left ventricular ejection fraction, by estimation, is 60 to 65%. The left ventricle has normal function. The left ventricle has no regional wall motion abnormalities. Left ventricular diastolic parameters are consistent with Grade I diastolic dysfunction (impaired relaxation).  2. Right ventricular systolic function is normal. The right ventricular size is normal. There is normal pulmonary artery systolic pressure.  3. The mitral valve is normal in structure. Trivial mitral valve regurgitation.  4. The aortic valve is tricuspid. There is mild calcification of the aortic valve. There is mild thickening of the aortic valve. Aortic valve regurgitation is not visualized. Mild aortic valve sclerosis is present, with no evidence of aortic valve stenosis.  5. The inferior vena cava is normal in size with greater than 50% respiratory variability, suggesting right atrial pressure of 3 mmHg. Comparison(s): No prior Echocardiogram. Conclusion(s)/Recommendation(s): No intracardiac source of embolism detected on this transthoracic study. A transesophageal echocardiogram is recommended to exclude cardiac source of embolism if clinically indicated. FINDINGS  Left Ventricle: Left ventricular ejection fraction, by estimation, is 60 to 65%. The left ventricle has normal function. The left ventricle has no regional wall motion abnormalities. The left ventricular internal cavity size was normal in size. There is  borderline concentric left ventricular hypertrophy. Left ventricular diastolic parameters are consistent with Grade I diastolic dysfunction (impaired relaxation). Right Ventricle: The right ventricular size is normal. No increase in right ventricular wall thickness. Right ventricular systolic function is normal. There is normal pulmonary artery systolic pressure. The tricuspid  regurgitant velocity is 1.98 m/s, and  with an assumed right atrial pressure of 3 mmHg, the estimated right ventricular systolic pressure is 66.0 mmHg. Left Atrium: Left atrial size was normal in size. Right Atrium: Right atrial size was normal in size. Pericardium: There is no evidence of pericardial effusion. Mitral Valve: The mitral valve is normal in structure. There is mild thickening of the mitral valve leaflet(s). Mild mitral annular calcification. Trivial mitral valve regurgitation. Tricuspid Valve: The tricuspid valve is normal in structure. Tricuspid valve regurgitation is  trivial. Aortic Valve: The aortic valve is tricuspid. There is mild calcification of the aortic valve. There is mild thickening of the aortic valve. There is mild aortic valve annular calcification. Aortic valve regurgitation is not visualized. Mild aortic valve sclerosis is present, with no evidence of aortic valve stenosis. Pulmonic Valve: The pulmonic valve was normal in structure. Pulmonic valve regurgitation is not visualized. Aorta: The aortic root is normal in size and structure. Venous: The inferior vena cava is normal in size with greater than 50% respiratory variability, suggesting right atrial pressure of 3 mmHg. IAS/Shunts: No atrial level shunt detected by color flow Doppler.  LEFT VENTRICLE PLAX 2D LVIDd:         3.60 cm Diastology LVIDs:         2.10 cm LV e' medial:    6.09 cm/s LV PW:         1.00 cm LV E/e' medial:  13.9 LV IVS:        1.00 cm LV e' lateral:   8.16 cm/s                        LV E/e' lateral: 10.4  RIGHT VENTRICLE             IVC RV S prime:     17.80 cm/s  IVC diam: 1.30 cm TAPSE (M-mode): 2.3 cm LEFT ATRIUM             Index       RIGHT ATRIUM          Index LA diam:        3.40 cm 1.93 cm/m  RA Area:     9.99 cm LA Vol (A2C):   47.9 ml 27.14 ml/m RA Volume:   19.60 ml 11.11 ml/m LA Vol (A4C):   41.7 ml 23.63 ml/m LA Biplane Vol: 45.0 ml 25.50 ml/m  AORTIC VALVE LVOT Vmax:   93.80 cm/s LVOT  Vmean:  62.700 cm/s LVOT VTI:    0.224 m MITRAL VALVE                TRICUSPID VALVE MV Area (PHT): 2.45 cm     TR Peak grad:   15.7 mmHg MV Decel Time: 310 msec     TR Vmax:        198.00 cm/s MV E velocity: 84.60 cm/s MV A velocity: 104.00 cm/s  SHUNTS MV E/A ratio:  0.81         Systemic VTI: 0.22 m Gwyndolyn Kaufman MD Electronically signed by Gwyndolyn Kaufman MD Signature Date/Time: 05/27/2020/11:11:34 AM    Final    VAS [place] LOWER EXTREMITY VENOUS (DVT)  Result Date: 05/27/2020  Lower Venous DVT Study Indications: Stroke.  Comparison Study: no prior Performing Technologist: Abram Sander RVS  Examination Guidelines: A complete evaluation includes B-mode imaging, spectral Doppler, color Doppler, and power Doppler as needed of all accessible portions of each vessel. Bilateral testing is considered an integral part of a complete examination. Limited examinations for reoccurring indications may be performed as noted. The reflux portion of the exam is performed with the patient in reverse Trendelenburg.  +---------+---------------+---------+-----------+----------+--------------+ RIGHT    CompressibilityPhasicitySpontaneityPropertiesThrombus Aging +---------+---------------+---------+-----------+----------+--------------+ CFV      Full           Yes      Yes                                 +---------+---------------+---------+-----------+----------+--------------+  SFJ      Full                                                        +---------+---------------+---------+-----------+----------+--------------+ FV Prox  Full                                                        +---------+---------------+---------+-----------+----------+--------------+ FV Mid   Full                                                        +---------+---------------+---------+-----------+----------+--------------+ FV DistalFull                                                         +---------+---------------+---------+-----------+----------+--------------+ PFV      Full                                                        +---------+---------------+---------+-----------+----------+--------------+ POP      Full           Yes      Yes                                 +---------+---------------+---------+-----------+----------+--------------+ PTV      Full                                                        +---------+---------------+---------+-----------+----------+--------------+ PERO     Full                                                        +---------+---------------+---------+-----------+----------+--------------+   +---------+---------------+---------+-----------+----------+--------------+ LEFT     CompressibilityPhasicitySpontaneityPropertiesThrombus Aging +---------+---------------+---------+-----------+----------+--------------+ CFV      Full           Yes      Yes                                 +---------+---------------+---------+-----------+----------+--------------+ SFJ      Full                                                        +---------+---------------+---------+-----------+----------+--------------+  FV Prox  Full                                                        +---------+---------------+---------+-----------+----------+--------------+ FV Mid   Full                                                        +---------+---------------+---------+-----------+----------+--------------+ FV DistalFull                                                        +---------+---------------+---------+-----------+----------+--------------+ PFV      Full                                                        +---------+---------------+---------+-----------+----------+--------------+ POP      Full           Yes      Yes                                  +---------+---------------+---------+-----------+----------+--------------+ PTV      Full                                                        +---------+---------------+---------+-----------+----------+--------------+ PERO     Full                                                        +---------+---------------+---------+-----------+----------+--------------+     Summary: BILATERAL: - No evidence of deep vein thrombosis seen in the lower extremities, bilaterally. - No evidence of superficial venous thrombosis in the lower extremities, bilaterally. -No evidence of popliteal cyst, bilaterally.   *See table(s) above for measurements and observations. Electronically signed by Deitra Mayo MD on 05/27/2020 at 1:48:00 PM.    Final     Microbiology: Recent Results (from the past 240 hour(s))  Resp Panel by RT-PCR (Flu A&B, Covid) Nasopharyngeal Swab     Status: None   Collection Time: 05/26/20  6:06 PM   Specimen: Nasopharyngeal Swab; Nasopharyngeal(NP) swabs in vial transport medium  Result Value Ref Range Status   SARS Coronavirus 2 by RT PCR NEGATIVE NEGATIVE Final    Comment: (NOTE) SARS-CoV-2 target nucleic acids are NOT DETECTED.  The SARS-CoV-2 RNA is generally detectable in upper respiratory specimens during the acute phase of infection. The lowest concentration of SARS-CoV-2 viral copies this assay can detect is 138 copies/mL. A negative result does not preclude SARS-Cov-2  infection and should not be used as the sole basis for treatment or other patient management decisions. A negative result may occur with  improper specimen collection/handling, submission of specimen other than nasopharyngeal swab, presence of viral mutation(s) within the areas targeted by this assay, and inadequate number of viral copies(<138 copies/mL). A negative result must be combined with clinical observations, patient history, and epidemiological information. The expected result is  Negative.  Fact Sheet for Patients:  EntrepreneurPulse.com.au  Fact Sheet for Healthcare Providers:  IncredibleEmployment.be  This test is no t yet approved or cleared by the [place] FDA and  has been authorized for detection and/or diagnosis of SARS-CoV-2 by FDA under an Emergency Use Authorization (EUA). This EUA will remain  in effect (meaning this test can be used) for the duration of the COVID-19 declaration under Section 564(b)(1) of the Act, 21 U.S.C.section 360bbb-3(b)(1), unless the authorization is terminated  or revoked sooner.       Influenza A by PCR NEGATIVE NEGATIVE Final   Influenza B by PCR NEGATIVE NEGATIVE Final    Comment: (NOTE) The Xpert Xpress SARS-CoV-2/FLU/RSV plus assay is intended as an aid in the diagnosis of influenza from Nasopharyngeal swab specimens and should not be used as a sole basis for treatment. Nasal washings and aspirates are unacceptable for Xpert Xpress SARS-CoV-2/FLU/RSV testing.  Fact Sheet for Patients: EntrepreneurPulse.com.au  Fact Sheet for Healthcare Providers: IncredibleEmployment.be  This test is not yet approved or cleared by the [place] FDA and has been authorized for detection and/or diagnosis of SARS-CoV-2 by FDA under an Emergency Use Authorization (EUA). This EUA will remain in effect (meaning this test can be used) for the duration of the COVID-19 declaration under Section 564(b)(1) of the Act, 21 U.S.C. section 360bbb-3(b)(1), unless the authorization is terminated or revoked.  Performed at [facility], [place] [address], [place], [place] [id number]      Labs: CBC: Recent Labs  Lab 05/26/20 1639 05/27/20 0240 05/29/20 0201  WBC 9.2 5.6 6.9  NEUTROABS 7.0  --  4.1  HGB 13.7 12.5 12.9  HCT 43.6 38.3 40.1  MCV 94.0 92.7 91.3  PLT 339 287 676   Basic Metabolic Panel: Recent Labs  Lab 05/26/20 1639 05/27/20 0240  05/29/20 0201  NA 138 138 138  K 4.3 3.7 3.8  CL 102 104 103  CO2 26 26 26   GLUCOSE 102* 93 112*  BUN 9 9 13   CREATININE 0.82 0.86 0.93  CALCIUM 9.3 9.0 8.9  MG  --  2.1 2.1  PHOS  --  3.2  --    Liver Function Tests: Recent Labs  Lab 05/26/20 1639 05/27/20 0240 05/29/20 0201  AST 26 19 18   ALT 20 17 14   ALKPHOS 69 59 60  BILITOT 0.6 0.6 0.8  PROT 6.8 5.7* 6.0*  ALBUMIN 3.7 3.1* 3.0*   CBG: No results for input(s): GLUCAP in the last 168 hours.  Time spent: 35 minutes  Signed:  Berle Mull  [facility]  05/31/2020 1:04 PM

## 2020-05-31 NOTE — Progress Notes (Signed)
Pt admitted to floor. Escorted via w/c with pt daughter, RN and NT. She is alert and oriented to person and place and time and situation. She shows no signs of distress. No resp distress. Skin is warm and dry. Pt orientated to room and staff and all safety precautions.

## 2020-06-01 ENCOUNTER — Inpatient Hospital Stay (HOSPITAL_COMMUNITY): Payer: PPO | Admitting: Speech Pathology

## 2020-06-01 ENCOUNTER — Inpatient Hospital Stay (HOSPITAL_COMMUNITY): Payer: PPO | Admitting: Occupational Therapy

## 2020-06-01 ENCOUNTER — Inpatient Hospital Stay (HOSPITAL_COMMUNITY): Payer: PPO

## 2020-06-01 DIAGNOSIS — I6350 Cerebral infarction due to unspecified occlusion or stenosis of unspecified cerebral artery: Secondary | ICD-10-CM

## 2020-06-01 LAB — CBC WITH DIFFERENTIAL/PLATELET
Abs Immature Granulocytes: 0.02 10*3/uL (ref 0.00–0.07)
Basophils Absolute: 0 10*3/uL (ref 0.0–0.1)
Basophils Relative: 1 %
Eosinophils Absolute: 0.1 10*3/uL (ref 0.0–0.5)
Eosinophils Relative: 1 %
HCT: 38.6 % (ref 36.0–46.0)
Hemoglobin: 12.1 g/dL (ref 12.0–15.0)
Immature Granulocytes: 0 %
Lymphocytes Relative: 31 %
Lymphs Abs: 1.8 10*3/uL (ref 0.7–4.0)
MCH: 28.9 pg (ref 26.0–34.0)
MCHC: 31.3 g/dL (ref 30.0–36.0)
MCV: 92.1 fL (ref 80.0–100.0)
Monocytes Absolute: 0.7 10*3/uL (ref 0.1–1.0)
Monocytes Relative: 12 %
Neutro Abs: 3.2 10*3/uL (ref 1.7–7.7)
Neutrophils Relative %: 55 %
Platelets: 319 10*3/uL (ref 150–400)
RBC: 4.19 MIL/uL (ref 3.87–5.11)
RDW: 12.4 % (ref 11.5–15.5)
WBC: 5.8 10*3/uL (ref 4.0–10.5)
nRBC: 0 % (ref 0.0–0.2)

## 2020-06-01 LAB — COMPREHENSIVE METABOLIC PANEL
ALT: 21 U/L (ref 0–44)
AST: 26 U/L (ref 15–41)
Albumin: 2.8 g/dL — ABNORMAL LOW (ref 3.5–5.0)
Alkaline Phosphatase: 68 U/L (ref 38–126)
Anion gap: 11 (ref 5–15)
BUN: 18 mg/dL (ref 8–23)
CO2: 26 mmol/L (ref 22–32)
Calcium: 8.9 mg/dL (ref 8.9–10.3)
Chloride: 102 mmol/L (ref 98–111)
Creatinine, Ser: 0.87 mg/dL (ref 0.44–1.00)
GFR, Estimated: >60 mL/min (ref >60)
Glucose, Bld: 106 mg/dL — ABNORMAL HIGH (ref 70–99)
Potassium: 4 mmol/L (ref 3.5–5.1)
Sodium: 139 mmol/L (ref 135–145)
Total Bilirubin: 0.5 mg/dL (ref 0.3–1.2)
Total Protein: 6 g/dL — ABNORMAL LOW (ref 6.5–8.1)

## 2020-06-01 MED ORDER — LIDOCAINE 5 % EX PTCH
2.0000 | MEDICATED_PATCH | CUTANEOUS | Status: DC
  Administered 2020-06-01 – 2020-06-05 (×5): 2 via TRANSDERMAL
  Filled 2020-06-01 (×5): qty 2

## 2020-06-01 NOTE — Progress Notes (Signed)
Inpatient Rehabilitation  Patient information reviewed and entered into eRehab system by Ayaka Andes M. Lyndy Russman, M.A., CCC/SLP, PPS Coordinator.  Information including medical coding, functional ability and quality indicators will be reviewed and updated through discharge.    

## 2020-06-01 NOTE — PMR Pre-admission (Signed)
PMR Admission Coordinator Pre-Admission Assessment  Patient: [name] is an 79 y.o., female MRN: [id number] DOB: 07/07/1940 Height: _0  (162.6 cm) Weight: 69.2 kg              Insurance Information HMO:     PPO: yes     PCP:      IPA:      80/20:      OTHER:  PRIMARY: [employer]      Policy#: [id number]      Subscriber: pt CM Name: [name]      Phone#: [phone]     Fax#: EPIC access Pre-Cert#: [id number] auth for CIR provided by Tammy with [employer].  Update due weekly, and [employer] has epic access      Employer: n/a Benefits:  Phone #: [phone]     Name: n/a Eff. Date: 06/15/19     Deduct: $0      Out of Pocket Max: $3400 (met $126.76)      Life Max: n/a  CIR: $295/day for days 1-6      SNF: 20 full days Outpatient:      Co-Pay: $20/visit Home Health: 100%      Co-Pay:  DME: 80%     Co-Pay: 20% Providers:  SECONDARY:       Policy#:       Phone#:   Development worker, community:       Phone#:   The Therapist, art Information Summary" for patients in Inpatient Rehabilitation Facilities with attached "Privacy Act [place] Records" was provided and verbally reviewed with: Patient and Family  Emergency Contact Information Contact Information    Name Relation Home Work Mobile   [name],[name] Spouse [phone]  [phone]     Current Medical History  Patient Admitting Diagnosis: CVA   History of Present Illness: [name] is a 79 y.o. 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  During her ED stay left lower extremity weakness had resolved and she did not receive tPA.  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  2D echo showed EF 65% with no wall and mild aortic valve sclerosis.  BLE Dopplers were negative for DVT.  Patient had recurrent LLE weakness and  Dr. 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  Loop recorder recommended to rule out A fib and DAPT x3 months to be followed by ASA alone.  Patient with resultant right sided weakness with numbness, posterior lean with sitting and inability to stand.  Assumed to be NWB Left wrist due to triquetral avulsion fracture.  Pt states wrist pain started after fall ~2d PTA.  Also notes an episode of LLE numbness occurring >several days PTA but this occurred after resting left lateral knee against hand of cabinet while sitting at her bar. Fall occurred 2d prior to admission, pt developed Left side weakness on day of admission shortly before a scheduled orthopedica appt.    Patient has been reporting right shoulder pain since fall and has difficulty with extension at shoulder; post cortisone injection reporting improved pain.  Therapy evaluations completed  and patient showing excellent progress, with recommendations for CIR.        Past Medical History  Past Medical History:  Diagnosis Date  . Blood transfusion without reported diagnosis    with hip fracture-79 years old  . Cancer [facility]) 1983   Kidney Cancer  . Diverticulosis   . GERD (gastroesophageal reflux disease)   . Personal history of kidney cancer     Family History  family history includes Colon cancer in her mother; Coronary artery disease in her father.  Prior Rehab/Hospitalizations:  Has the patient had prior rehab or hospitalizations prior to admission? No  Has the patient had major surgery during 100 days prior to admission? Yes  Current Medications   Current Facility-Administered Medications:  .  acetaminophen (TYLENOL) tablet 325-650 mg,  325-650 mg, Oral, Q4H PRN, Jacquelynn Cree, PA-C, 650 mg at 06/01/20 0755 .  alum & mag hydroxide-simeth (MAALOX/MYLANTA) 200-200-20 MG/5ML suspension 30 mL, 30 mL, Oral, Q4H PRN, Love, Pamela S, PA-C .  aspirin EC tablet 81 mg, 81 mg, Oral, Daily, Love, Pamela S, PA-C, 81 mg at 06/01/20 0745 .  atorvastatin (LIPITOR) tablet 40 mg, 40 mg, Oral, Daily, Love, Pamela S, PA-C, 40 mg at 06/01/20 0745 .  bisacodyl (DULCOLAX) suppository 10 mg, 10 mg, Rectal, Daily PRN, Love, Pamela S, PA-C .  clopidogrel (PLAVIX) tablet 75 mg, 75 mg, Oral, Daily, Love, Pamela S, PA-C, 75 mg at 06/01/20 0745 .  cyclobenzaprine (FLEXERIL) tablet 5 mg, 5 mg, Oral, TID PRN, Raeya Merritts, MD, 5 mg at 06/01/20 0755 .  diphenhydrAMINE (BENADRYL) 12.5 MG/5ML elixir 12.5-25 mg, 12.5-25 mg, Oral, Q6H PRN, Love, Pamela S, PA-C .  enoxaparin (LOVENOX) injection 40 mg, 40 mg, Subcutaneous, Q24H, Love, Pamela S, PA-C, 40 mg at 05/31/20 1853 .  guaiFENesin-dextromethorphan (ROBITUSSIN DM) 100-10 MG/5ML syrup 5-10 mL, 5-10 mL, Oral, Q6H PRN, Love, Pamela S, PA-C .  lidocaine (LIDODERM) 5 % 2 patch, 2 patch, Transdermal, Q24H, Kyser Wandel, MD .  pantoprazole (PROTONIX) EC tablet 40 mg, 40 mg, Oral, Daily, Love, Pamela S, PA-C, 40 mg at 06/01/20 0745 .  polyethylene glycol (MIRALAX / GLYCOLAX) packet 17 g, 17 g, Oral, Daily, Love, Pamela S, PA-C, 17 g at 06/01/20 0745 .  polyethylene glycol (MIRALAX / GLYCOLAX) packet 17 g, 17 g, Oral, Daily PRN, Love, Pamela S, PA-C .  prochlorperazine (COMPAZINE) tablet 5-10 mg, 5-10 mg, Oral, Q6H PRN **OR** prochlorperazine (COMPAZINE) injection 5-10 mg, 5-10 mg, Intramuscular, Q6H PRN **OR** prochlorperazine (COMPAZINE) suppository 12.5 mg, 12.5 mg, Rectal, Q6H PRN, Love, Pamela S, PA-C .  senna-docusate (Senokot-S) tablet 1 tablet, 1 tablet, Oral, BID, Love, Pamela S, PA-C, 1 tablet at 06/01/20 0745 .  sodium phosphate (FLEET) 7-19 GM/118ML enema 1 enema, 1 enema, Rectal, Once PRN, Love, Pamela S,  PA-C .  sorbitol 70 % solution 30 mL, 30 mL, Oral, Once, Leylanie Woodmansee, MD .  traMADol (ULTRAM) tablet 50 mg, 50 mg, Oral, Q6H PRN, Love, Pamela S, PA-C .  traZODone (DESYREL) tablet 25-50 mg, 25-50 mg, Oral, QHS PRN, Love, Pamela S, PA-C  Patients Current Diet:  Diet Order            Diet Heart Room service appropriate? Yes; Fluid consistency: Thin  Diet effective now                 Precautions / Restrictions Restrictions Weight Bearing Restrictions: Yes LUE Weight Bearing: Weight bearing as tolerated   Has the patient had 2 or more falls or  a fall with injury in the past year?Yes  Prior Activity Level    Prior Functional Level    Self Care: Did the patient need help bathing, dressing, using the toilet or eating?  Independent  Indoor Mobility: Did the patient need assistance with walking from room to room (with or without device)? Independent  Stairs: Did the patient need assistance with internal or external stairs (with or without device)? Independent  Functional Cognition: Did the patient need help planning regular tasks such as shopping or remembering to take medications? Independent  Home Assistive Devices / Equipment Home Assistive Devices/Equipment: None  Prior Device Use: Indicate devices/aids used by the patient prior to current illness, exacerbation or injury? None of the above  Current Functional Level Cognition  Orientation Level: Oriented X4    Extremity Assessment (includes Sensation/Coordination)          ADLs       Mobility       Transfers       Ambulation / Gait / Stairs / Office manager / Balance      Special needs/care consideration n/a     Previous Home Environment (from acute therapy documentation) Living Arrangements: Spouse/significant other Home Care Services: No  Discharge Living Setting Does the patient have any problems obtaining your medications?: No  Social/Family/Support Systems      Goals     Decrease burden of Care through IP rehab admission: n/a   Possible need for SNF placement upon discharge: Not anticipated  Patient Condition: This patient's medical and functional status has changed since the consult dated: 05/28/20 in which the Rehabilitation Physician determined and documented that the patient's condition is appropriate for intensive rehabilitative care in an inpatient rehabilitation facility. See "History of Present Illness" (above) for medical update. Functional changes are: pt min +2 with mobility and ADLs. Patient's medical and functional status update has been discussed with the Rehabilitation physician and patient remains appropriate for inpatient rehabilitation. Will admit to inpatient rehab Saturday.  Preadmission Screen Completed By:  Courtney Heys, MD, 06/01/2020 10:31 AM ______________________________________________________________________   Discussed status with Dr. Dagoberto Ligas on 06/01/20 at 10:31 AM  and received approval for admission Saturday.  Admission Coordinator:  Courtney Heys, PT, DPT time 10:31 AM Sudie Grumbling 06/01/20

## 2020-06-01 NOTE — Evaluation (Signed)
Physical Therapy Assessment and Plan  Patient Details  Name: [name] MRN: [id number] Date of Birth: 1940-11-05  PT Diagnosis: Abnormality of gait and Hemiparesis non-dominant Rehab Potential: Good ELOS: 5-7 days   Today's Date: 06/01/2020 PT Individual Time: 1300-1415 PT Individual Time Calculation (min): 75 min    Hospital Problem: Principal Problem:   Stroke (cerebrum) ([place]) Active Problems:   Triquetral fracture   Past Medical History:  Past Medical History:  Diagnosis Date  . Blood transfusion without reported diagnosis    with hip fracture-79 years old  . Cancer [facility]) 1983   Kidney Cancer  . Diverticulosis   . GERD (gastroesophageal reflux disease)   . Personal history of kidney cancer    Past Surgical History:  Past Surgical History:  Procedure Laterality Date  . ABDOMINAL HYSTERECTOMY    . COLON SURGERY     Severe sigmoid diverticulitis  . COLONOSCOPY    . FOOT SURGERY    . HIP SURGERY    . Kidney cancer     Kidney removed  . 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  During her ED stay LLE weakness had resolved and she did not receive TPA.  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  2D echo showed EF of 65% with no wall abnormality and AV sclerosis.  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  MBS done 12/16 showed normal swallow function and regular diet/thins liquids  recommended.  Loop recorder was placed to rule out A. fib  by Dr. Quentin Ore on 12/17.  Dr. Erlinda Hong recommended DAPT x3 months followed by ASA alone.  Left wrist placed in splint due to triquetral avulsion fracture.  She has reported acute on chronic right shoulder pain since fall and shoulder was injected with Marcaine/Depo-Medrol on 12/17. herapy has been ongoing and patient was limited by left-sided weakness with numbness, tendency, NWB status on left wrist and decreased safety awareness affecting ADLs and mobility Patient currently requires min with mobility secondary to muscle weakness, decreased cardiorespiratoy endurance and impaired timing and sequencing. And significantly impaired balance/gait function. Prior to hospitalization, patient was independent  with mobility and lived with Spouse in a House home.  Home access is 1Stairs to enter.  Patient will benefit from skilled PT intervention to maximize safe functional mobility, minimize fall risk and decrease caregiver burden for planned discharge home with intermittent assist.  Husband is not able to provide physical assist.  Anticipate patient will benefit from follow up OP at discharge if transportation is available to her for this service.   PT - End of Session Activity Tolerance: Tolerates 30+ min activity with multiple rests Endurance Deficit: Yes Endurance Deficit Description: requires rest breaks between functional tasks due to fatigue w/exertion.  Rest breaks brief PT Assessment Rehab Potential (ACUTE/IP ONLY): Good PT Barriers to Discharge: Weight bearing restrictions PT Barriers to Discharge Comments: fails to adhere to NWB on LUE w/functional tasks, frequent reminders needed PT Patient demonstrates impairments in  the following area(s): Balance;Endurance;Safety;Sensory PT Transfers Functional Problem(s): Bed to Chair;Car;Furniture PT Locomotion Functional Problem(s): Ambulation;Stairs PT Plan PT Intensity: Minimum of 1-2 x/day ,45 to 90  minutes PT Frequency: 5 out of 7 days PT Duration Estimated Length of Stay: 5-7 days PT Treatment/Interventions: Ambulation/gait training;DME/adaptive equipment instruction;Neuromuscular re-education;Stair training;UE/LE Strength taining/ROM;Balance/vestibular training;Discharge planning;Therapeutic Activities;Disease management/prevention;Functional mobility training;Patient/family education;Therapeutic Exercise PT Transfers Anticipated Outcome(s): mod i PT Locomotion Anticipated Outcome(s): mod i PT Recommendation Follow Up Recommendations: Outpatient PT Patient destination: Home Equipment Recommended: To be determined Equipment Details: may need platform RW, will continue to assess depending on progress   PT Evaluation Precautions/Restrictions Precautions Precautions: Fall Required Braces or Orthoses: Splint/Cast Splint/Cast: L wrist soft slplint Restrictions Weight Bearing Restrictions: Yes Other Position/Activity Restrictions: Assumed NWB on L wrist General   Vital SignsTherapy Vitals Temp: 97.8 F (36.6 C) Pulse Rate: 77 Resp: 18 BP: (!) 143/72 Patient Position (if appropriate): Lying Oxygen Therapy SpO2: 97 % O2 Device: Room Air Pain Pain Assessment Pain Scale: (P) 0-10 Home Living/Prior Functioning Home Living Available Help at Discharge: Family Type of Home: House Home Access: Stairs to enter Technical brewer of Steps: 1 Entrance Stairs-Rails: None Home Layout: One level Bathroom Shower/Tub: Multimedia programmer: Handicapped height Bathroom Accessibility: Yes  Lives With: Spouse Prior Function Level of Independence: Independent with basic ADLs;Independent with homemaking with wheelchair;Independent with transfers;Independent with gait;Independent with homemaking with ambulation  Able to Take Stairs?: Yes Driving: Yes Vocation: Retired Leisure: Hobbies-yes (Comment) Comments: art - paints Vision/Perception     Cognition Overall  Cognitive Status: Impaired/Different from baseline Arousal/Alertness: Awake/alert Orientation Level: Oriented X4 Attention: Sustained Focused Attention: Appears intact Sustained Attention: Appears intact Memory: Appears intact Sensation Sensation Light Touch: Impaired by gross assessment (LLE below knee) Proprioception: Impaired by gross assessment (L great toe) Coordination Gross Motor Movements are Fluid and Coordinated: Yes Fine Motor Movements are Fluid and Coordinated: Yes Motor  Motor Motor: Hemiplegia Motor - Skilled Clinical Observations: mild LLE weakness w/testing, movement slowed vs R but no ataxia   Trunk/Postural Assessment  Cervical Assessment Cervical Assessment: Within Functional Limits Thoracic Assessment Thoracic Assessment: Within Functional Limits Lumbar Assessment Lumbar Assessment: Within Functional Limits Postural Control Postural Control: Deficits on evaluation Righting Reactions: delayed L UE/LLE w/standing balance tasks  Balance Balance Balance Assessed: Yes Static Sitting Balance Static Sitting - Balance Support: Feet supported Static Sitting - Level of Assistance: 5: Stand by assistance Dynamic Sitting Balance Dynamic Sitting - Balance Support: Feet supported Dynamic Sitting - Level of Assistance: 5: Stand by assistance Dynamic Sitting - Balance Activities: Forward lean/weight shifting;Lateral lean/weight shifting;Reaching across midline Sitting balance - Comments: very mild post tendency w/overhad reach Static Standing Balance Static Standing - Balance Support: No upper extremity supported Static Standing - Level of Assistance: 4: Min assist Static Standing - Comment/# of Minutes: mild increased sway Dynamic Standing Balance Dynamic Standing - Balance Support: During functional activity Dynamic Standing - Level of Assistance: 4: Min assist (w/gait and reaching to floor) Dynamic Standing - Comments: pt w/mild R lean standing w/feet together,  unable to stand tandem/increased R lean Extremity Assessment  RUE Assessment RUE Assessment: Within Functional Limits LUE Assessment LUE Assessment: Exceptions to [facility] Passive Range of Motion (PROM) Comments: limited at R shoulder w/pain w/abd/elevation pt states is premorbid. L wrist immobilized due to fracture Active Range of Motion (AROM) Comments: see ot eval for details General Strength Comments: limited at R shoulder w/abd/elevation pt states is premorbid. R wrist immobilized RLE Assessment RLE Assessment: Exceptions to [facility]  Passive Range of Motion (PROM) Comments: WNL Active Range of Motion (AROM) Comments: WNL General Strength Comments: WNL LLE Assessment Passive Range of Motion (PROM) Comments: wfl Active Range of Motion (AROM) Comments: wfl General Strength Comments: grossly 4/5 throughout  Care Tool Care Tool Bed Mobility Roll left and right activity   Roll left and right assist level: Independent    Sit to lying activity   Sit to lying assist level: Independent    Lying to sitting edge of bed activity   Lying to sitting edge of bed assist level: Supervision/Verbal cueing (TO ADHERE TO [facility])     Care Tool Transfers Sit to stand transfer   Sit to stand assist level: Minimal Assistance - Patient > 75% (NO ad)    Chair/bed transfer   Chair/bed transfer assist level: Minimal Assistance - Patient > 75%     Physiological scientist transfer assist level: Minimal Assistance - Patient > 75%      Care Tool Locomotion Ambulation   Assist level: Minimal Assistance - Patient > 75% (NO ad) Assistive device: No Device Max distance: 50  Walk 10 feet activity   Assist level: Minimal Assistance - Patient > 75% Assistive device: No Device   Walk 50 feet with 2 turns activity   Assist level: Minimal Assistance - Patient > 75% Assistive device: No Device  Walk 150 feet activity Walk 150 feet activity did not occur: Safety/medical concerns      Walk  10 feet on uneven surfaces activity   Assist level: Moderate Assistance - Patient - 50 - 74% Assistive device: Other (comment) (NO DEVICE, MIN ASSIST W/PFRW)  Stairs   Assist level: Minimal Assistance - Patient > 75% Stairs assistive device: 1 hand rail Max number of stairs: 4  Walk up/down 1 step activity   Walk up/down 1 step (curb) assist level: Minimal Assistance - Patient > 75% Walk up/down 1 step or curb assistive device: 1 hand rail    Walk up/down 4 steps activity Walk up/down 4 steps assist level: Minimal Assistance - Patient > 75% Walk up/down 4 steps assistive device: 1 hand rail  Walk up/down 12 steps activity Walk up/down 12 steps activity did not occur: Safety/medical concerns      Pick up small objects from floor   Pick up small object from the floor assist level: Minimal Assistance - Patient > 75% Pick up small object from the floor assistive device: NO DEVICE  Wheelchair Will patient use wheelchair at discharge?: No          Wheel 50 feet with 2 turns activity      Wheel 150 feet activity        Refer to Care Plan for Long Term Goals  SHORT TERM GOAL WEEK 1 PT Short Term Goal 1 (Week 1): STGS=LTGS  Recommendations for other services: None   Skilled Therapeutic Intervention  Evaluation completed (see details above and below) with education on PT POC and goals and individual treatment initiated with focus on functional mobility/transfers, LE strength, dynamic standing balance/coordination, ambulation, stair navigation, simulated car transfers, and improved endurance with activity. Pt/daughter w/mult questions at beginning of session.  Describe one full step down from house into carport no rail.  Considering adding "1/2 step" w/plan to order online.  Discussed allowing therapist to assess mobility and revisit.  Daughter also w/mult questions re:  HHPT/OPT/LOS etc.  Explained purpose of eval and team conference  Pt transported  to Pt gyms for full assessment and  treatment interventions inclduing: Static/dynamic balance activities: standing reach/feet together/feet semitandem, backing, repeated Sit to stand all w/min assist overall. Gait w/and w/o AD as pt was I with all mobility PTA and discussion of balance training to reduce risk of falls/purpose of IPR to maximize recovery/improve balance/gait/reduce risk of falls.   Discussion of PT goals/recommendation for 5-7 day LOS due to risk of falls. At end of session, pt transported to room.  Daughter w/questions regarding current level of function/IPR LOS to be determined in team conference/discussed info w/her and all in agreement. Pt left oob in wc w/alarm belt set and needs in reach  Mobility Bed Mobility Bed Mobility: Rolling Right;Rolling Left;Sit to Supine;Supine to Sit Rolling Right: Independent Rolling Left: Independent Supine to Sit: Supervision/Verbal cueing Sit to Supine: Supervision/Verbal cueing Transfers Transfers: Sit to Stand;Stand to Sit;Stand Pivot Transfers Sit to Stand: Minimal Assistance - Patient > 75% Stand to Sit: Minimal Assistance - Patient > 75% Stand Pivot Transfers: Minimal Assistance - Patient > 75% Stand Pivot Transfer Details (indicate cue type and reason): pt fearful of falling, moves slowly, HHA for safety, mild difficulty advancing LLE Locomotion  Gait Ambulation: (P) Yes Gait Assistance: (P) Minimal Assistance - Patient > 75% Gait Distance (Feet): (P) 50 Feet Assistive device: (P) None Gait Assistance Details: (P) flexed posture, step to gait pattern, mild decreased celarance LLE, mild L trendelenberg, mild r knee flexion in stance due to leng length discreapancy Gait Gait: (P) Yes Gait Pattern: (P) Impaired Gait velocity: (P) reduced Stairs / Additional Locomotion Stairs: Yes Stairs Assistance: Minimal Assistance - Patient > 75% Stair Management Technique: One rail Left Number of Stairs: 4 Height of Stairs: 5 Ramp: Minimal Assistance - Patient >75%  (platform walker) Curb: Minimal Assistance - Patient >75% (using handrail) Wheelchair Mobility Wheelchair Mobility: No   Discharge Criteria: Patient will be discharged from PT if patient refuses treatment 3 consecutive times without medical reason, if treatment goals not met, if there is a change in medical status, if patient makes no progress towards goals or if patient is discharged from hospital.  The above assessment, treatment plan, treatment alternatives and goals were discussed and mutually agreed upon: by patient  Jerrilyn Cairo 06/01/2020, 4:36 PM

## 2020-06-01 NOTE — Evaluation (Signed)
Speech Language Pathology Assessment and Plan  Patient Details  Name: [name] MRN: [id number] Date of Birth: [birth year]  SLP Diagnosis: Cognitive Impairments  Rehab Potential: Excellent ELOS: 14 days, possibly shorter for SLP    Today's Date: 06/01/2020 SLP Individual Time: 0830-0930 SLP Individual Time Calculation (min): 60 min   Hospital Problem: Principal Problem:   Stroke (cerebrum) ([place]) Active Problems:   Triquetral fracture  Past Medical History:  Past Medical History:  Diagnosis Date  . Blood transfusion without reported diagnosis    with hip fracture-79 years old  . Cancer [facility]) 1983   Kidney Cancer  . Diverticulosis   . GERD (gastroesophageal reflux disease)   . Personal history of kidney cancer    Past Surgical History:  Past Surgical History:  Procedure Laterality Date  . ABDOMINAL HYSTERECTOMY    . COLON SURGERY     Severe sigmoid diverticulitis  . COLONOSCOPY    . FOOT SURGERY    . HIP SURGERY    . Kidney cancer     Kidney removed  . 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  During her ED stay LLE weakness had resolved and she did not receive TPA.  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  2D echo showed EF of 65% with no wall abnormality and AV sclerosis.  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  MBS done 12/16 showed normal swallow function and regular  diet/thins liquids recommended.  Loop recorder was placed to rule out A. fib  by Dr. Quentin Ore on 12/17.  Dr. Erlinda Hong recommended DAPT x3 months followed by ASA alone.  Left wrist placed in splint due to triquetral avulsion fracture.  She has reported acute on chronic right shoulder pain since fall and shoulder was injected with Marcaine/Depo-Medrol on 12/17. Therapy has been ongoing and patient was limited by left-sided weakness with numbness, tendency, NWB status on left wrist and decreased safety awareness affecting ADLs and mobility.  CIR was recommended due to functional decline.  SLP evaluation was completed on 06/01/20 with results as follows:   Cognitive-linguistic evaluation: Pt was given portions of the [employer]ogn[employer] standardized cognitive assessment with results all falling within average range with the exception of attention subtest.  Functionally, attention did not appear to be a limiting factor in any tasks assessed.  Suspect that higher level deficits such as working memory could be contributing to errors on subtest; however, pt reports to be near her baseline.   As a result, pt would benefit from at least 1-2 additional ST treatment sessions for ongoing diagnostic treatment of higher level ADLs such as medication and financial management in order to reduce burden of care and maximize functional independence in the home environment.    Skilled Therapeutic Interventions          Cognitive-linguistic evaluation completed with results and recommendations reviewed with patient.     SLP Assessment  Patient will need skilled [place] Pathology  Services during CIR admission    Recommendations  Patient destination: Home Follow up Recommendations: Other (comment) (To be determined pending progress made while inpatient) Equipment Recommended: None recommended by SLP    SLP Frequency 3 to 5 out of 7 days   SLP Duration  SLP Intensity  SLP Treatment/Interventions 14 days, possibly shorter for  SLP  Minumum of 1-2 x/day, 30 to 90 minutes  Cognitive remediation/compensation;Cueing hierarchy;Patient/family education;Internal/external aids;Environmental controls;Functional tasks    Pain Pain Assessment Pain Scale: 0-10 Pain Score: 3  Pain Type: Acute pain Pain Location: Neck Pain Descriptors / Indicators: Aching Pain Intervention(s): Repositioned  Prior Functioning Cognitive/Linguistic Baseline: Within functional limits Type of Home: House  Lives With: Spouse Available Help at Discharge: Family  SLP Evaluation Cognition Overall Cognitive Status: Impaired/Different from baseline Arousal/Alertness: Awake/alert Orientation Level: Oriented X4 Attention: Sustained Sustained Attention: Appears intact Memory: Appears intact Awareness: Appears intact Problem Solving: Appears intact Executive Function:  (working memory)  Agricultural engineer Comprehension Overall Auditory Comprehension: Appears within functional limits for tasks assessed Expression Expression Primary Mode of Expression: Verbal Verbal Expression Overall Verbal Expression: Appears within functional limits for tasks assessed Oral Motor Oral Motor/Sensory Function Overall Oral Motor/Sensory Function: Within functional limits Motor Speech Overall Motor Speech: Appears within functional limits for tasks assessed  Care Tool Care Tool Cognition Expression of Ideas and Wants Expression of Ideas and Wants: Without difficulty (complex and basic) - expresses complex messages without difficulty and with speech that is clear and easy to understand   Understanding Verbal and Non-Verbal Content Understanding Verbal and Non-Verbal Content: Understands (complex and basic) - clear comprehension without cues or repetitions   Memory/Recall Ability *first 3 days only Memory/Recall Ability *first 3 days only: Current season;Location of own room;Staff names and faces;That he or she is in a hospital/hospital unit     Short Term Goals: Week 1: SLP Short Term Goal 1 (Week 1): Pt will complete complex ADLs (specifically medication and money management) with mod I.  Refer to Care Plan for Long Term Goals  Recommendations for other services: None   Discharge Criteria: Patient will be discharged from SLP if patient refuses treatment 3 consecutive times without medical reason, if treatment goals not met, if there is a change in medical status, if patient makes no progress towards goals or if patient is discharged from hospital.  The above assessment, treatment plan, treatment alternatives and goals were discussed and mutually agreed upon: by patient  Emilio Math 06/01/2020, 12:18 PM

## 2020-06-01 NOTE — Evaluation (Signed)
Occupational Therapy Assessment and Plan  Patient Details  Name: [name] MRN: [id number] Date of Birth: Mar 13, 1941  OT Diagnosis: hemiplegia affecting non-dominant side, muscle weakness (generalized) and left wrist fracture Rehab Potential:   ELOS: 5-7 days   Today's Date: 06/01/2020 OT Individual Time: 1100-1210 OT Individual Time Calculation (min): 70 min     Hospital Problem: Principal Problem:   Stroke (cerebrum) ([place]) Active Problems:   Triquetral fracture   Past Medical History:  Past Medical History:  Diagnosis Date  . Blood transfusion without reported diagnosis    with hip fracture-79 years old  . Cancer [facility]) 1983   Kidney Cancer  . Diverticulosis   . GERD (gastroesophageal reflux disease)   . Personal history of kidney cancer    Past Surgical History:  Past Surgical History:  Procedure Laterality Date  . ABDOMINAL HYSTERECTOMY    . COLON SURGERY     Severe sigmoid diverticulitis  . COLONOSCOPY    . FOOT SURGERY    . HIP SURGERY    . Kidney cancer     Kidney removed  . POLYPECTOMY      Assessment & Plan Clinical Impression: Patient is a 79 y.o. 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  During her ED stay LLE weakness had resolved and she did not receive TPA.  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  2D echo showed EF of 65% with no wall abnormality and AV sclerosis.  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  MBS done 12/16 showed normal swallow function and  regular diet/thins liquids recommended.  Loop recorder was placed to rule out A. fib  by Dr. Quentin Ore on 12/17.  Dr. Erlinda Hong recommended DAPT x3 months followed by ASA alone.  Left wrist placed in splint due to triquetral avulsion fracture.  She has reported acute on chronic right shoulder pain since fall and shoulder was injected with Marcaine/Depo-Medrol on 12/17. herapy has been ongoing and patient was limited by left-sided weakness with numbness, tendency, NWB status on left wrist and decreased safety awareness affecting ADLs and mobility.  CIR was recommended due to functional decline.  Pt reports R shoulder is better after cortisone shot- not eating well- feels full/bloated.  LBM Thursday- wants to continue Tramadol/flexeril prn for neck pain.  .  Patient transferred to CIR on 05/31/2020 .    Patient currently requires min with basic self-care skills and IADL secondary to muscle weakness, decreased cardiorespiratoy endurance, impaired timing and sequencing, decreased coordination and decreased motor planning and decreased standing balance, decreased postural control and hemiplegia.  Prior to hospitalization, patient could complete ADL/home management with modified independent .  Patient will benefit from skilled intervention to decrease level of assist with basic self-care skills, increase independence with basic self-care skills and increase level of independence with iADL prior to discharge home with care partner.  Anticipate patient will require intermittent supervision and follow up outpatient.  OT - End of Session Activity Tolerance: Tolerates 30+ min activity with multiple rests Endurance Deficit: Yes Endurance Deficit Description: fatigue with adl  tasks OT Assessment OT Patient demonstrates impairments in the following area(s): Balance;Endurance;Motor OT Basic ADL's Functional Problem(s): Grooming;Bathing;Dressing;Toileting OT Advanced ADL's Functional Problem(s): Simple Meal  Preparation;Light Housekeeping OT Transfers Functional Problem(s): Toilet;Tub/Shower OT Additional Impairment(s): Fuctional Use of Upper Extremity OT Plan OT Intensity: Minimum of 1-2 x/day, 45 to 90 minutes OT Frequency: 5 out of 7 days OT Duration/Estimated Length of Stay: 5-7 days OT Treatment/Interventions: Balance/vestibular training;Neuromuscular re-education;Self Care/advanced ADL retraining;DME/adaptive equipment instruction;Patient/family education;UE/LE Coordination activities;Discharge planning;Functional mobility training;Therapeutic Activities OT Self Feeding Anticipated Outcome(s): independent OT Basic Self-Care Anticipated Outcome(s): mod I/CS OT Toileting Anticipated Outcome(s): mod I OT Bathroom Transfers Anticipated Outcome(s): mod I/CS OT Recommendation Patient destination: Home Follow Up Recommendations: Outpatient OT Equipment Recommended: None recommended by OT Equipment Details: patient owns shower seat   OT Evaluation Precautions/Restrictions  Precautions Precautions: Fall Precaution Comments: please have patient wear her shoes for transfer/ambulation (left with lift for leg length discrepancy) Required Braces or Orthoses: Splint/Cast Splint/Cast: L wrist soft slplint Restrictions Weight Bearing Restrictions: Yes LUE Weight Bearing: Non weight bearing Other Position/Activity Restrictions: Assumed NWB on L wrist General   Vital Signs Therapy Vitals Temp: 97.8 F (36.6 C) Pulse Rate: 77 Resp: 18 BP: (!) 143/72 Patient Position (if appropriate): Lying Oxygen Therapy SpO2: 97 % O2 Device: Room Air Pain Pain Assessment Pain Scale: 0-10 Pain Score: 2  Pain Type: Acute pain Pain Orientation: Right;Left Pain Descriptors / Indicators: Aching Pain Intervention(s): Repositioned Home Living/Prior Functioning Home Living Family/patient expects to be discharged to:: Private residence Living Arrangements: Spouse/significant other Available Help at  Discharge: Family Type of Home: House Home Access: Stairs to enter Technical brewer of Steps: 1 Entrance Stairs-Rails: None Home Layout: One level Bathroom Shower/Tub: Walk-in Radio producer: Handicapped height Bathroom Accessibility: Yes Additional Comments: daughter currently present from [place] and will give care through holidays as needed. Daughter works as a Pharmacist, hospital. spouse is 48 yo and requires some assistance from daugther as well  Lives With: Spouse Prior Function Level of Independence: Independent with basic ADLs,Independent with homemaking with wheelchair,Independent with transfers,Independent with gait,Independent with homemaking with ambulation  Able to Take Stairs?: Yes Driving: Yes Vocation: Retired Leisure: Hobbies-yes (Comment) Comments: art - paints Vision Baseline Vision/History: Wears glasses Patient Visual Report: No change from baseline Vision Assessment?: Yes Eye Alignment: Within Functional Limits Ocular Range of Motion: Within Functional Limits Alignment/Gaze Preference: Within Defined Limits Tracking/Visual Pursuits: Able to track stimulus in all quads without difficulty Saccades: Within functional limits Visual Fields: No apparent deficits Perception  Perception: Within Functional Limits Praxis Praxis: Intact Cognition Overall Cognitive Status: Within Functional Limits for tasks assessed Arousal/Alertness: Awake/alert Orientation Level: Person;Place;Situation Person: Oriented Place: Oriented Situation: Oriented Year: 2021 Month: December Day of Week: Correct Memory: Appears intact Immediate Memory Recall: Sock;Blue;Bed Memory Recall Sock: Without Cue Memory Recall Blue: Without Cue Memory Recall Bed: Without Cue Attention: Sustained Focused Attention: Appears intact Sustained Attention: Appears intact Awareness: Appears intact Problem Solving: Appears intact Executive Function:  (working memory) Safety/Judgment: Engineering geologist Light Touch: Impaired by gross assessment (LLE below knee) Proprioception: Impaired by gross assessment (L great toe) Additional Comments: left hand/forearm in soft cast - digits moving without difficulty, light touch and temperature intact Coordination Gross Motor Movements are Fluid and Coordinated: Yes Fine Motor Movements are Fluid and Coordinated: Yes (within limits of cast - no resistance) Finger Nose Finger Test: WFL bilateral Motor  Motor Motor: Hemiplegia Motor - Skilled Clinical Observations: mild LLE weakness w/testing, movement slowed vs R but no ataxia  Trunk/Postural Assessment  Cervical Assessment Cervical Assessment: Within Functional Limits Thoracic Assessment Thoracic Assessment: Within Functional Limits Lumbar Assessment Lumbar Assessment: Within Functional Limits Postural Control Postural Control: Deficits on evaluation Righting Reactions: delayed L UE/LLE w/standing balance tasks  Balance Balance Balance Assessed: Yes Static Sitting Balance Static Sitting - Balance Support: Feet supported Static Sitting - Level of Assistance: 5: Stand by assistance Dynamic Sitting Balance Dynamic Sitting - Balance Support: Feet supported Dynamic Sitting - Level of Assistance: 5: Stand by assistance Dynamic Sitting - Balance Activities: Forward lean/weight shifting;Lateral lean/weight shifting;Reaching across midline Sitting balance - Comments: very mild post tendency w/overhad reach Static Standing Balance Static Standing - Balance Support: No upper extremity supported Static Standing - Level of Assistance: 4: Min assist Static Standing - Comment/# of Minutes: mild increased sway Dynamic Standing Balance Dynamic Standing - Balance Support: During functional activity Dynamic Standing - Level of Assistance: 4: Min assist Dynamic Standing - Comments: pt w/mild R lean standing w/feet together, unable to stand tandem/increased R  lean Extremity/Trunk Assessment RUE Assessment RUE Assessment: Within Functional Limits Active Range of Motion (AROM) Comments: shoulder 3/4 to full, distal [facility] General Strength Comments: injection to right shoulder yesterday effective - patient notes history of bilateral rotator cuff tears LUE Assessment LUE Assessment: Exceptions to [facility] Passive Range of Motion (PROM) Comments: shoulder 3/4 to full, no pain during assessment, left soft cast intact forearm/wrist/hand, digits moving without difficulty Active Range of Motion (AROM) Comments: see ot eval for details General Strength Comments: limited at R shoulder w/abd/elevation pt states is premorbid. R wrist immobilized  Care Tool Care Tool Self Care Eating   Eating Assist Level: Set up assist    Oral Care    Oral Care Assist Level: Supervision/Verbal cueing    Bathing   Body parts bathed by patient: Right arm;Left arm;Chest;Abdomen;Front perineal area;Buttocks;Face     Assist Level: Minimal Assistance - Patient > 75%    Upper Body Dressing(including orthotics)   What is the patient wearing?: Pull over shirt;Bra   Assist Level: Minimal Assistance - Patient > 75%    Lower Body Dressing (excluding footwear)   What is the patient wearing?: Underwear/pull up;Pants Assist for lower body dressing: Contact Guard/Touching assist    Putting on/Taking off footwear   What is the patient wearing?: Shoes Assist for footwear: Minimal Assistance - Patient > 75%       Care Tool Toileting Toileting activity   Assist for toileting: Contact Guard/Touching assist     Care Tool Bed Mobility Roll left and right activity   Roll left and right assist level: Independent    Sit to lying activity   Sit to lying assist level: Independent    Lying to sitting edge of bed activity   Lying to sitting edge of bed assist level: Supervision/Verbal cueing (TO ADHERE TO [facility])     Care Tool Transfers Sit to stand transfer   Sit to stand assist  level: Minimal Assistance - Patient > 75% (NO ad)    Chair/bed transfer   Chair/bed transfer assist level: Minimal Assistance - Patient > 75%     Toilet transfer         Care Tool Cognition Expression of Ideas and Wants Expression of Ideas and Wants: Without difficulty (complex and basic) - expresses complex messages without difficulty and with speech that is clear and easy to understand   Understanding Verbal and Non-Verbal Content Understanding Verbal and Non-Verbal Content: Understands (complex and basic) - clear comprehension without cues or  repetitions   Memory/Recall Ability *first 3 days only Memory/Recall Ability *first 3 days only: Current season;That he or she is in a hospital/hospital unit    Refer to Care Plan for [place] 1 OT Short Term Goal 1 (Week 1): STG = LTG due to anticipated LOS  Recommendations for other services: None    Skilled Therapeutic Intervention    Patient in bed alert and eager to start therapy process, daughter present for session with many questions about recovery and plan.  Reviewed role of OT, evaluation process, therapy plan, safety with mobility, adl training, goals for therapy, schedule and next steps.  Both patient and daughter receptive and demonstrate understanding.  Evaluation completed as documented above.  Patient presents with mild left side weakness, left wrist/hand fracture limiting functional use at this time, balance deficits impacting her ability to complete adl and iadl tasks independently at this time.  She is an excellent candidate for IP rehab.  She was happy to complete adl training and functional mobility activities - overall CG/min A for self care and balance activities.  She remained seated in w/c at close of session, seat alarm set and call bell in reach.  Relayed information to PT for next session.    ADL ADL Eating: Set up Where Assessed-Eating: Bed level Grooming:  Setup;Supervision/safety Where Assessed-Grooming: Wheelchair;Sitting at sink Upper Body Bathing: Minimal assistance Where Assessed-Upper Body Bathing: Wheelchair;Sitting at sink Lower Body Bathing: Minimal assistance Where Assessed-Lower Body Bathing: Wheelchair;Sitting at sink Upper Body Dressing: Minimal assistance Where Assessed-Upper Body Dressing: Wheelchair Lower Body Dressing: Contact guard Where Assessed-Lower Body Dressing: Wheelchair Toileting: Contact guard Where Assessed-Toileting: Glass blower/designer: Therapist, music Method: Counselling psychologist: Grab bars Mobility  Bed Mobility Bed Mobility: Rolling Right;Rolling Left;Sit to Supine;Supine to Sit Rolling Right: Independent Rolling Left: Independent Supine to Sit: Supervision/Verbal cueing Sit to Supine: Supervision/Verbal cueing Transfers Sit to Stand: Minimal Assistance - Patient > 75% Stand to Sit: Minimal Assistance - Patient > 75%   Discharge Criteria: Patient will be discharged from OT if patient refuses treatment 3 consecutive times without medical reason, if treatment goals not met, if there is a change in medical status, if patient makes no progress towards goals or if patient is discharged from hospital.  The above assessment, treatment plan, treatment alternatives and goals were discussed and mutually agreed upon: by patient  Carlos Levering 06/01/2020, 4:52 PM

## 2020-06-01 NOTE — Progress Notes (Signed)
Inpatient Rehabilitation Medication Review by a Pharmacist  A complete drug regimen review was completed for this patient to identify any potential clinically significant medication issues.  Clinically significant medication issues were identified:  no   Type of Medication Issue Identified Description of Issue Urgent (address now) Non-Urgent (address on AM team rounds) Plan Plan Accepted by Provider? (Yes / No / Pending AM Rounds)  Drug Interaction(s) (clinically significant)       Duplicate Therapy       Allergy       No Medication Administration End Date       Incorrect Dose  Discharge summary states to schedule acetaminophen 650 mg daily but currently ordered as needed.  Non-urgent Patient was receiving as needed while inpatient, appropriate to continue as ordered. NA  Additional Drug Therapy Needed  Discharge summary states to resume home meds: magnesium, multivitamin, vitamin C Non-urgent Sent secure chat to Reesa Chew, [place] Pending response  Other         Name of provider notified for urgent issues identified: NA  Provider Method of Notification: NA   For non-urgent medication issues to be resolved on team rounds tomorrow morning a [facility] Secure Chat Handoff was sent to: Reesa Chew, [place]   Pharmacist comments: NA  Time spent performing this drug regimen review (minutes):  [place], PharmD PGY-1 Pharmacy Resident 06/01/2020 7:42 AM Please see AMION for all pharmacy numbers

## 2020-06-01 NOTE — Progress Notes (Signed)
RN received report from [place], Therapist, sports.  Patient to room via wheelchair with therapy staff in stable condition, then assisted to use restroom using platform walker. Pt with moderate sized BM and assisted to bed using platform walker with standby assist.  Splint noted to left hand/wrist, C/D/I.  Agree with previous RN assessment. Patient denies current pain or needs, call light placed within reach, daughter stepped out but to return.

## 2020-06-01 NOTE — Progress Notes (Signed)
Pt transferred to [facility] room 9. Report given to nurse Melissa. Pt successfully escorted to new room. All belongings sent with pt and pt daughter.

## 2020-06-01 NOTE — H&P (Signed)
Physical Medicine and Rehabilitation Admission H&P    Chief Complaint  Patient presents with  . Stroke with functional deficits.    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  During her ED stay LLE weakness had resolved and she did not receive TPA.  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  2D echo showed EF of 65% with no wall abnormality and AV sclerosis.  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  MBS done 12/16 showed normal swallow function and regular diet/thins liquids recommended.  Loop recorder was placed to rule out A. fib  by Dr. Quentin Ore on 12/17.  Dr. Erlinda Hong recommended DAPT x3 months followed by ASA alone.  Left wrist placed in splint due to triquetral avulsion fracture.  She has reported acute on chronic right shoulder pain since fall and shoulder was injected with Marcaine/Depo-Medrol on 12/17. herapy has been ongoing and patient was limited by left-sided weakness with numbness, tendency, NWB status on left wrist and decreased safety awareness affecting ADLs and mobility.  CIR was recommended due to functional decline.  Pt reports R shoulder is better after cortisone shot- not eating well- feels full/bloated.  LBM Thursday- wants to continue Tramadol/flexeril prn for neck pain.    Review of Systems  Constitutional: Negative for chills and fever.  HENT: Negative for hearing loss and tinnitus.   Eyes: Negative for blurred vision and double vision.   Respiratory: Negative for cough and shortness of breath.   Cardiovascular: Negative for chest pain and palpitations.  Gastrointestinal: Positive for heartburn. Negative for constipation and nausea.  Genitourinary: Negative for dysuria and urgency.  Musculoskeletal: Positive for joint pain, myalgias and neck pain.  Skin: Negative for rash.  Neurological: Positive for sensory change and weakness. Negative for dizziness and headaches.  Psychiatric/Behavioral: The patient is nervous/anxious and has insomnia (due to neck pain).   All other systems reviewed and are negative.    Past Medical History:  Diagnosis Date  . Blood transfusion without reported diagnosis    with hip fracture-79 years old  . Cancer [facility]) 1983   Kidney Cancer  . Diverticulosis   . GERD (gastroesophageal reflux disease)   . Personal history of kidney cancer     Past Surgical History:  Procedure Laterality Date  . ABDOMINAL HYSTERECTOMY    . COLON SURGERY     Severe sigmoid diverticulitis  . COLONOSCOPY    . FOOT SURGERY    . HIP SURGERY    . Kidney cancer     Kidney removed  . POLYPECTOMY      Family History  Problem Relation Age of Onset  . Colon cancer Mother   . Coronary artery disease Father   . Breast cancer Neg Hx     Social History:  Married. Independent PTA. Retired from [employer]. She reports that she has never smoked. She has never used smokeless tobacco. She reports that she does not drink alcohol and does not use drugs.    Allergies  Allergen Reactions  . Codeine  Other (See Comments)    Pt unsure if really allergic to this    Medications Prior to Admission  Medication Sig Dispense Refill  . acetaminophen (TYLENOL) 650 MG CR tablet Take 650 mg by mouth daily.    Marland Kitchen aspirin EC 81 MG EC tablet Take 1 tablet (81 mg total) by mouth daily. Swallow whole. 30 tablet 0  . atorvastatin (LIPITOR) 40 MG tablet Take 1 tablet (40 mg total) by mouth daily. 30 tablet 0  . clopidogrel  (PLAVIX) 75 MG tablet Take 1 tablet (75 mg total) by mouth daily. 90 tablet 0  . cyclobenzaprine (FLEXERIL) 5 MG tablet Take 1 tablet (5 mg total) by mouth 3 (three) times daily as needed for muscle spasms. 30 tablet 0  . MAGNESIUM PO Take 1 tablet by mouth every evening.    . Multiple Vitamins-Minerals (MULTIVITAMIN WITH MINERALS) tablet Take 1 tablet by mouth daily.    Marland Kitchen omeprazole (PRILOSEC) 40 MG capsule Take 40 mg by mouth daily.    . polyethylene glycol (MIRALAX / GLYCOLAX) 17 g packet Take 17 g by mouth daily. 14 each 0  . senna-docusate (SENOKOT-S) 8.6-50 MG tablet Take 1 tablet by mouth 2 (two) times daily. 30 tablet 0  . traMADol (ULTRAM) 50 MG tablet Take 1 tablet (50 mg total) by mouth every 6 (six) hours as needed for severe pain or moderate pain. 30 tablet 0  . vitamin C (ASCORBIC ACID) 500 MG tablet Take 500 mg by mouth daily.      Drug Regimen Review  Drug regimen was reviewed and remains appropriate with no significant issues identified  Home: Home Living Family/patient expects to be discharged to:: Private residence Living Arrangements: Spouse/significant other   Functional History:    Functional Status:  Mobility:          ADL:    Cognition: Cognition Orientation Level: Oriented X4     Blood pressure (!) 156/70, pulse 68, temperature 98.2 F (36.8 C), temperature source Oral, resp. rate 18, height 5\' 4"  (1.626 m), weight 69.2 kg, SpO2 98 %. Physical Exam Vitals and nursing note reviewed. Exam conducted with a chaperone present.  Constitutional:      Appearance: Normal appearance.     Comments: Anxious appearing (husband in room with congested cough)   Seen with daughter in room- sitting up in bedside chair, appropriate, NAD  HENT:     Head: Normocephalic and atraumatic.     Comments: Smile equal- tongue midline    Right Ear: External ear normal.     Left Ear: External ear normal.     Nose: Nose normal. No congestion.     Mouth/Throat:      Mouth: Mucous membranes are dry.     Pharynx: Oropharynx is clear. No oropharyngeal exudate.  Eyes:     General:        Right eye: No discharge.        Left eye: No discharge.     Extraocular Movements: Extraocular movements intact.     Comments: No nystagmus  Neck:     Comments: Tight scalenes and levators/upper traps B/L Cardiovascular:     Rate and Rhythm: Normal rate and regular rhythm.     Heart sounds: Normal heart sounds. No murmur heard.   Pulmonary:     Comments: CTA B/L- no W/R/R- good air movement Abdominal:     Comments: Soft, NT, ND, (+)BS - hypoactive BS  Musculoskeletal:     Comments: RUE- biceps, triceps, WE, 4+/5 and  grip and finger abd 5-/5 LUE- biceps, triceps 4+/5, WE- NT due ot fx, grip and finger abd 5-./5 RLE- HF, KE, KF ,DF and PF 5/5 LLE- 5-/5 in same muscles B/L   Skin:    General: Skin is warm and dry.     Comments: L wrist in splint wrapped with ACE wrap- fingers moving and hand warm   Neurological:     Mental Status: She is alert and oriented to person, place, and time.     Comments: Light touch decreased from L knee down to foot Intact on R side- LUE- intact to light touch as well Slowed processing- needed daughter to fill in info for her.   Psychiatric:        Mood and Affect: Mood normal.        Behavior: Behavior normal.     Results for orders placed or performed during the hospital encounter of 05/31/20 (from the past 48 hour(s))  Comprehensive metabolic panel     Status: Abnormal   Collection Time: 06/01/20  5:17 AM  Result Value Ref Range   Sodium 139 135 - 145 mmol/L   Potassium 4.0 3.5 - 5.1 mmol/L   Chloride 102 98 - 111 mmol/L   CO2 26 22 - 32 mmol/L   Glucose, Bld 106 (H) 70 - 99 mg/dL    Comment: Glucose reference range applies only to samples taken after fasting for at least 8 hours.   BUN 18 8 - 23 mg/dL   Creatinine, Ser 0.87 0.44 - 1.00 mg/dL   Calcium 8.9 8.9 - 10.3 mg/dL   Total Protein 6.0 (L) 6.5 - 8.1 g/dL    Albumin 2.8 (L) 3.5 - 5.0 g/dL   AST 26 15 - 41 U/L   ALT 21 0 - 44 U/L   Alkaline Phosphatase 68 38 - 126 U/L   Total Bilirubin 0.5 0.3 - 1.2 mg/dL   GFR, Estimated >60 >60 mL/min    Comment: (NOTE) Calculated using the CKD-EPI Creatinine Equation (2021)    Anion gap 11 5 - 15    Comment: Performed at [facility], [place] [address], [place], [place] [address]  CBC WITH DIFFERENTIAL     Status: None   Collection Time: 06/01/20  5:17 AM  Result Value Ref Range   WBC 5.8 4.0 - 10.5 K/uL   RBC 4.19 3.87 - 5.11 MIL/uL   Hemoglobin 12.1 12.0 - 15.0 g/dL   HCT 38.6 36.0 - 46.0 %   MCV 92.1 80.0 - 100.0 fL   MCH 28.9 26.0 - 34.0 pg   MCHC 31.3 30.0 - 36.0 g/dL   RDW 12.4 11.5 - 15.5 %   Platelets 319 150 - 400 K/uL   nRBC 0.0 0.0 - 0.2 %   Neutrophils Relative % 55 %   Neutro Abs 3.2 1.7 - 7.7 K/uL   Lymphocytes Relative 31 %   Lymphs Abs 1.8 0.7 - 4.0 K/uL   Monocytes Relative 12 %   Monocytes Absolute 0.7 0.1 - 1.0 K/uL   Eosinophils Relative 1 %   Eosinophils Absolute 0.1 0.0 - 0.5 K/uL   Basophils Relative 1 %   Basophils Absolute 0.0 0.0 - 0.1 K/uL   Immature Granulocytes 0 %   Abs Immature Granulocytes 0.02 0.00 - 0.07 K/uL    Comment: Performed at [facility], [address] [place], [place] [address]   No results found.     Medical Problem List and Plan: 1.  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  Antithrombotics: -DVT/anticoagulation:  Pharmaceutical: Lovenox  -antiplatelet therapy: DAPT 3. Pain Management: ultram and flexeril prn- added lidoderm patches to neck 9pm to 9am and kpad for heat 4. Mood:  LCSW to follow for evaluation and support.   -antipsychotic agents: N/A 5. Neuropsych: This patient is capable of making decisions on her own behalf. 6. Skin/Wound Care:  Routine pressure relief measures.  7. Fluids/Electrolytes/Nutrition: Monitor I/O. Check lytes in am. 8. Dyslipidemia: Continue Lipitor.  9. L wrist- triquetral avulsion fx- NWB on LUE- can use platform walker 10. Constipation- will give Sorbitol if no BM by Sunday.  11. 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        Courtney Heys, MD 06/01/2020

## 2020-06-02 ENCOUNTER — Inpatient Hospital Stay (HOSPITAL_COMMUNITY): Payer: PPO | Admitting: Physical Therapy

## 2020-06-02 ENCOUNTER — Inpatient Hospital Stay (HOSPITAL_COMMUNITY): Payer: PPO | Admitting: Occupational Therapy

## 2020-06-02 ENCOUNTER — Encounter (HOSPITAL_COMMUNITY): Payer: Self-pay | Admitting: Cardiology

## 2020-06-02 ENCOUNTER — Inpatient Hospital Stay (HOSPITAL_COMMUNITY): Payer: PPO

## 2020-06-02 ENCOUNTER — Inpatient Hospital Stay (HOSPITAL_COMMUNITY): Payer: PPO | Admitting: Speech Pathology

## 2020-06-02 LAB — CBC
HCT: 39 % (ref 36.0–46.0)
Hemoglobin: 12.6 g/dL (ref 12.0–15.0)
MCH: 30 pg (ref 26.0–34.0)
MCHC: 32.3 g/dL (ref 30.0–36.0)
MCV: 92.9 fL (ref 80.0–100.0)
Platelets: 332 10*3/uL (ref 150–400)
RBC: 4.2 MIL/uL (ref 3.87–5.11)
RDW: 12.5 % (ref 11.5–15.5)
WBC: 6.8 10*3/uL (ref 4.0–10.5)
nRBC: 0 % (ref 0.0–0.2)

## 2020-06-02 MED ORDER — LIDOCAINE 5 % EX PTCH: 2.0000 | MEDICATED_PATCH | CUTANEOUS | 0 refills | Status: DC

## 2020-06-02 MED ORDER — MAGNESIUM OXIDE 400 (241.3 MG) MG PO TABS
200.0000 mg | ORAL_TABLET | Freq: Every evening | ORAL | Status: DC
  Administered 2020-06-02 – 2020-06-05 (×4): 200 mg via ORAL
  Filled 2020-06-02 (×4): qty 1

## 2020-06-02 MED ORDER — MENTHOL 3 MG MT LOZG
1.0000 | LOZENGE | OROMUCOSAL | Status: DC | PRN
  Administered 2020-06-02 – 2020-06-04 (×2): 3 mg via ORAL
  Filled 2020-06-02 (×3): qty 9

## 2020-06-02 NOTE — Progress Notes (Signed)
Physical Medicine and Rehabilitation Consult   Reason for Consult:  Stroke with functional deficits Referring Physician: Dr. Berle Mull     HPI: [name] is a 79 y.o. 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  During her ED stay left lower extremity weakness had resolved and she did not receive tPA.  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  2D echo showed EF 65% with no wall and mild aortic valve sclerosis.  BLE Dopplers were negative for DVT.  Patient had recurrent LLE weakness and  Dr. 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 Loop recorder recommended to rule out A fib and DAPT x3 months to be followed by ASA alone. Work up/therapy evaluations underway. Patient with resultant right sided weakness with numbness, posterior lean with sitting and inability to stand.  Assumed to be NWB Left wrist due to triquetral avulsion fracture. Marland Kitchen CIR recommended due to functional decline.    Pt states wrist pain started after fall ~2d PTA.  Also notes an episode of LLE numbness occurring >several days PTA but this occurred after resting left lateral knee against hand of cabinet while sitting at her bar.    Fall occurred 2d prior to admission, pt developed Left side weakness on day of admission shortly before a scheduled orthopedica appt   Patient has been reporting right shoulder pain since fall and has difficulty with extension at shoulder. Daughter in room with multiple questions  regarding "mini stroke". 30 minutes spent with patient and daughter discussing results and recommendations, work up that is ongoing with loop recorder pending, therapies available/CIR, insurance process in addition to records review and exam.      Review of Systems  Constitutional: Negative for chills and fever.  HENT: Negative for hearing loss.   Eyes: Negative for blurred vision and double vision.  Respiratory: Negative for cough and shortness of breath.   Cardiovascular: Negative for chest pain and palpitations.  Gastrointestinal: Negative for abdominal pain, heartburn and nausea.  Genitourinary: Negative for dysuria, frequency and urgency.  Musculoskeletal: Positive for back pain (worse since fall), joint pain and myalgias.  Skin: Negative for rash.  Neurological: Positive for sensory change, speech change, weakness and headaches (worse). Negative for dizziness.  Psychiatric/Behavioral: Negative for depression and suicidal ideas. The patient is not nervous/anxious and does not have insomnia.           Past Medical History:  Diagnosis Date  . Blood transfusion without reported diagnosis      with hip fracture-79 years old  . Cancer [facility]) 1983    Kidney Cancer  . Diverticulosis    . GERD (gastroesophageal reflux disease)    . Personal history of kidney cancer             Past Surgical History:  Procedure Laterality Date  . ABDOMINAL HYSTERECTOMY      . COLON SURGERY        Severe sigmoid diverticulitis  . COLONOSCOPY      . FOOT SURGERY      .  HIP SURGERY      . Kidney cancer        Kidney removed  . POLYPECTOMY               Family History  Problem Relation Age of Onset  . Colon cancer Mother    . Coronary artery disease Father    . Breast cancer Neg Hx        Social History:  Married. Retired from [employer] --used to work in Publishing rights manager. She reports that she has never smoked. She has never used smokeless tobacco. She reports that she does not drink  alcohol and does not use drugs.      Allergies  Allergen Reactions  . Codeine Other (See Comments)      Pt unsure if really allergic to this            Medications Prior to Admission  Medication Sig Dispense Refill  . acetaminophen (TYLENOL) 650 MG CR tablet Take 650 mg by mouth daily.      Marland Kitchen estradiol (ESTRACE) 0.5 MG tablet Take 0.5 mg by mouth every evening.      Marland Kitchen MAGNESIUM PO Take 1 tablet by mouth every evening.      . Multiple Vitamins-Minerals (MULTIVITAMIN WITH MINERALS) tablet Take 1 tablet by mouth daily.      Marland Kitchen omeprazole (PRILOSEC) 40 MG capsule Take 40 mg by mouth daily.      . vitamin C (ASCORBIC ACID) 500 MG tablet Take 500 mg by mouth daily.          Home: Home Living Family/patient expects to be discharged to:: Private residence Living Arrangements: Spouse/significant other Available Help at Discharge: Family Type of Home: House Home Access: Stairs to enter [employer] of Steps: 1 (~9 inch step) Entrance Stairs-Rails: None Home Layout: One level Home Equipment: Environmental consultant - 2 wheels  Functional History: Prior Function Level of Independence: Independent Comments: Was independent with ambulation prior to admission Functional Status:  Mobility: Bed Mobility Overal bed mobility: Needs Assistance Bed Mobility: Supine to Sit,Sit to Supine Supine to sit: Max assist Sit to supine: Max assist General bed mobility comments: Max A for LE and trunk assist. Increased time to perform. Pt with posterior lean in sitting, so unsafe to attempt further mobility from higher stretcher height.   ADL:   Cognition: Cognition Overall Cognitive Status: Impaired/Different from baseline Orientation Level: Oriented to person,Oriented to place,Oriented to situation Cognition Arousal/Alertness: Awake/alert Behavior During Therapy: WFL for tasks assessed/performed Overall Cognitive Status: Impaired/Different from baseline Area of Impairment: Problem  solving,Safety/judgement Safety/Judgement: Decreased awareness of deficits Problem Solving: Slow processing     Blood pressure (!) 161/71, pulse 66, temperature 98.4 F (36.9 C), temperature source Oral, resp. rate 18, SpO2 99 %. Physical Exam Vitals and nursing note reviewed.  Constitutional:      Appearance: Normal appearance.  HENT:     Head: Normocephalic and atraumatic.  Eyes:     Extraocular Movements: Extraocular movements intact.     Conjunctiva/sclera: Conjunctivae normal.     Pupils: Pupils are equal, round, and reactive to light.  Cardiovascular:     Rate and Rhythm: Normal rate and regular rhythm.     Pulses: Normal pulses.     Heart sounds: Normal heart sounds. No murmur heard.    Pulmonary:     Effort: Pulmonary effort is normal. No respiratory distress.     Breath sounds: Normal breath sounds. No stridor.  Abdominal:     General: Abdomen is flat.  Bowel sounds are normal. There is no distension.     Palpations: Abdomen is soft.  Musculoskeletal:     Comments: LLE leg length discrepancy with tight heel cord.   Left wrist splint, no pain with left finger/thumb movement  Skin:    General: Skin is warm and dry.  Neurological:     Mental Status: She is alert and oriented to person, place, and time.     Comments: Mild left facial weakness with dysarthria. Able to follow simple commands without difficulty. Left sided weakness.   Motor 5/5 R Delt, Bi, tri, grip, HF, KE, ADF  3- Left delt , bi, tri, 2- finger flexion and ext, WE not assessed due to splint  3- Left hip flex, knee ext, 2- ankle DF/PF  Psychiatric:        Mood and Affect: Mood normal.        Behavior: Behavior normal.    sensation intact LT and proprio LLE   Intact LT LUE   Lab Results Last 24 Hours       Results for orders placed or performed during the hospital encounter of 05/26/20 (from the past 24 hour(s))  Hemoglobin A1c     Status: Abnormal    Collection Time: 05/28/20  3:34 AM   Result Value Ref Range    Hgb A1c MFr Bld 5.7 (H) 4.8 - 5.6 %    Mean Plasma Glucose 116.89 mg/dL  Lipid panel     Status: Abnormal    Collection Time: 05/28/20  3:34 AM  Result Value Ref Range    Cholesterol 234 (H) 0 - 200 mg/dL    Triglycerides 135 <150 mg/dL    HDL 68 >40 mg/dL    Total CHOL/HDL Ratio 3.4 RATIO    VLDL 27 0 - 40 mg/dL    LDL Cholesterol 139 (H) 0 - 99 mg/dL       Imaging Results (Last 48 hours)  DG Wrist Complete Left   Result Date: 05/26/2020 CLINICAL DATA:  Fall, pain EXAM: LEFT WRIST - COMPLETE 3+ VIEW COMPARISON:  None. FINDINGS: There is a small fragment dorsal to the mid carpus seen on lateral view. No other fracture or dislocation. Thumb basal arthrosis. Dorsal soft tissue edema. IMPRESSION: There is a small fragment dorsal to the mid carpus seen on lateral view, suspicious for triquetral avulsion fracture. No other fracture or dislocation. Dorsal soft tissue edema. Electronically Signed   By: Eddie Candle M.D.   On: 05/26/2020 15:47    CT HEAD WO CONTRAST   Result Date: 05/26/2020 CLINICAL DATA:  Left leg numbness EXAM: CT HEAD WITHOUT CONTRAST TECHNIQUE: Contiguous axial images were obtained from the base of the skull through the vertex without intravenous contrast. COMPARISON:  None. FINDINGS: Brain: No acute territorial infarction or hemorrhage is visualized. 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 Chronic infarct in the right cerebellum. Nonenlarged ventricles. Mild white matter hypodensity likely chronic small vessel ischemic change Vascular: No hyperdense vessels.  Carotid vascular calcification Skull: Normal. Negative for fracture Sinuses/Orbits: No acute finding. Other: None IMPRESSION: 1. No CT evidence for acute intracranial abnormality. Mild chronic small vessel ischemic change of the white matter. 2. 16 x 14 x 13 mm densely  calcified mass along the left skull base, inferior to left frontal lobe, likely representing calcified meningioma. 3. Chronic right cerebellar infarct. Electronically Signed   By: Madie Reno.D.  On: 05/26/2020 16:47    MR ANGIO HEAD WO CONTRAST   Result Date: 05/27/2020 CLINICAL DATA:  Left leg numbness, abnormal MRI brain EXAM: MRA HEAD WITHOUT CONTRAST TECHNIQUE: Angiographic images of the Circle of Willis were obtained using MRA technique without intravenous contrast. COMPARISON:  None. FINDINGS: Intracranial internal carotid arteries are patent with atherosclerotic irregularity and moderate stenosis of the right supraclinoid ICA. Proximal middle cerebral arteries are patent. There is occlusion of the right A2 ACA. Included intracranial vertebral arteries, basilar artery, posterior cerebral arteries are patent. IMPRESSION: Right A2 ACA occlusion. Areas of infarction on prior study could reflect watershed ischemia. Moderate right supraclinoid ICA stenosis. Electronically Signed   By: Macy Mis M.D.   On: 05/27/2020 11:38    MR ANGIO NECK W WO CONTRAST   Result Date: 05/27/2020 CLINICAL DATA:  Left leg numbness EXAM: MRI HEAD WITHOUT AND WITH CONTRAST MRA NECK WITHOUT AND WITH CONTRAST TECHNIQUE: Multiplanar, multiecho pulse sequences of the brain and surrounding structures were obtained without and with intravenous contrast. Angiographic images of the neck were obtained using MRA technique with and without intravenous contrast. Carotid stenosis measurements (when applicable) are obtained utilizing NASCET criteria, using the distal internal carotid diameter as the denominator. CONTRAST:  49mL GADAVIST GADOBUTROL 1 MMOL/ML IV SOLN COMPARISON:  None. FINDINGS: MRI HEAD FINDINGS Brain: Multifocal abnormal diffusion restriction within the right hemisphere, predominantly within the anterior frontal lobe and the medial parietal lobe. No acute or chronic hemorrhage. There is multifocal hyperintense  T2-weighted signal within the white matter. Parenchymal volume and CSF spaces are normal. There are old bilateral cerebellar infarcts. 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 Vascular: Major flow voids are preserved. Skull and upper cervical spine: Normal calvarium and skull base. Visualized upper cervical spine and soft tissues are normal. Sinuses/Orbits:No paranasal sinus fluid levels or advanced mucosal thickening. No mastoid or middle ear effusion. Normal orbits. MRA NECK FINDINGS Normal carotid and vertebral arteries. IMPRESSION: 1. Multifocal acute ischemia within the right hemisphere, predominantly within the anterior frontal lobe and medial parietal lobe. No hemorrhage or mass effect. 2. 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 3. Normal MRA of the neck. Electronically Signed   By: Ulyses Jarred M.D.   On: 05/27/2020 01:16    MR BRAIN W WO CONTRAST   Result Date: 05/27/2020 CLINICAL DATA:  Left leg numbness EXAM: MRI HEAD WITHOUT AND WITH CONTRAST MRA NECK WITHOUT AND WITH CONTRAST TECHNIQUE: Multiplanar, multiecho pulse sequences of the brain and surrounding structures were obtained without and with intravenous contrast. Angiographic images of the neck were obtained using MRA technique with and without intravenous contrast. Carotid stenosis measurements (when applicable) are obtained utilizing NASCET criteria, using the distal internal carotid diameter as the denominator. CONTRAST:  3mL GADAVIST GADOBUTROL 1 MMOL/ML IV SOLN COMPARISON:  None. FINDINGS: MRI HEAD FINDINGS Brain: Multifocal abnormal diffusion restriction within the right hemisphere, predominantly within the anterior frontal lobe and the medial parietal lobe. No acute or chronic hemorrhage. There is multifocal hyperintense T2-weighted signal within the white matter. Parenchymal volume and CSF spaces are normal. There are old bilateral cerebellar infarcts. There is a right planum sphenoidale meningioma  measuring 1.5 cm. Vascular: Major flow voids are preserved. Skull and upper cervical spine: Normal calvarium and skull base. Visualized upper cervical spine and soft tissues are normal. Sinuses/Orbits:No paranasal sinus fluid levels or advanced mucosal thickening. No mastoid or middle ear effusion. Normal orbits. MRA NECK FINDINGS Normal carotid and vertebral arteries. IMPRESSION: 1. Multifocal  acute ischemia within the right hemisphere, predominantly within the anterior frontal lobe and medial parietal lobe. No hemorrhage or mass effect. 2. 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 3. Normal MRA of the neck. Electronically Signed   By: Ulyses Jarred M.D.   On: 05/27/2020 01:16    ECHOCARDIOGRAM COMPLETE   Result Date: 05/27/2020    ECHOCARDIOGRAM REPORT   Patient Name:   [name] Date of Exam: 05/27/2020 Medical Rec #:  [id number]      Height:       64.5 in Accession #:    [id number]     Weight:       155.0 lb Date of Birth:  May 27, 1941      BSA:          1.765 m Patient Age:    65 years       BP:           155/75 mmHg Patient Gender: F              HR:           67 bpm. Exam Location:  Inpatient Procedure: 2D Echo Indications:    stroke 434.91  History:        Patient has no prior history of Echocardiogram examinations.  Sonographer:    Johny Chess Referring Phys: [id number] OLADAPO ADEFESO IMPRESSIONS  1. Left ventricular ejection fraction, by estimation, is 60 to 65%. The left ventricle has normal function. The left ventricle has no regional wall motion abnormalities. Left ventricular diastolic parameters are consistent with Grade I diastolic dysfunction (impaired relaxation).  2. Right ventricular systolic function is normal. The right ventricular size is normal. There is normal pulmonary artery systolic pressure.  3. The mitral valve is normal in structure. Trivial mitral valve regurgitation.  4. The aortic valve is tricuspid. There is mild calcification of the aortic valve. There is  mild thickening of the aortic valve. Aortic valve regurgitation is not visualized. Mild aortic valve sclerosis is present, with no evidence of aortic valve stenosis.  5. The inferior vena cava is normal in size with greater than 50% respiratory variability, suggesting right atrial pressure of 3 mmHg. Comparison(s): No prior Echocardiogram. Conclusion(s)/Recommendation(s): No intracardiac source of embolism detected on this transthoracic study. A transesophageal echocardiogram is recommended to exclude cardiac source of embolism if clinically indicated. FINDINGS  Left Ventricle: Left ventricular ejection fraction, by estimation, is 60 to 65%. The left ventricle has normal function. The left ventricle has no regional wall motion abnormalities. The left ventricular internal cavity size was normal in size. There is  borderline concentric left ventricular hypertrophy. Left ventricular diastolic parameters are consistent with Grade I diastolic dysfunction (impaired relaxation). Right Ventricle: The right ventricular size is normal. No increase in right ventricular wall thickness. Right ventricular systolic function is normal. There is normal pulmonary artery systolic pressure. The tricuspid regurgitant velocity is 1.98 m/s, and  with an assumed right atrial pressure of 3 mmHg, the estimated right ventricular systolic pressure is 15.4 mmHg. Left Atrium: Left atrial size was normal in size. Right Atrium: Right atrial size was normal in size. Pericardium: There is no evidence of pericardial effusion. Mitral Valve: The mitral valve is normal in structure. There is mild thickening of the mitral valve leaflet(s). Mild mitral annular calcification. Trivial mitral valve regurgitation. Tricuspid Valve: The tricuspid valve is normal in structure. Tricuspid valve regurgitation is trivial. Aortic Valve: The aortic valve is tricuspid. There is mild calcification of the aortic valve.  There is mild thickening of the aortic valve. There  is mild aortic valve annular calcification. Aortic valve regurgitation is not visualized. Mild aortic valve sclerosis is present, with no evidence of aortic valve stenosis. Pulmonic Valve: The pulmonic valve was normal in structure. Pulmonic valve regurgitation is not visualized. Aorta: The aortic root is normal in size and structure. Venous: The inferior vena cava is normal in size with greater than 50% respiratory variability, suggesting right atrial pressure of 3 mmHg. IAS/Shunts: No atrial level shunt detected by color flow Doppler.  LEFT VENTRICLE PLAX 2D LVIDd:         3.60 cm Diastology LVIDs:         2.10 cm LV e' medial:    6.09 cm/s LV PW:         1.00 cm LV E/e' medial:  13.9 LV IVS:        1.00 cm LV e' lateral:   8.16 cm/s                        LV E/e' lateral: 10.4  RIGHT VENTRICLE             IVC RV S prime:     17.80 cm/s  IVC diam: 1.30 cm TAPSE (M-mode): 2.3 cm LEFT ATRIUM             Index       RIGHT ATRIUM          Index LA diam:        3.40 cm 1.93 cm/m  RA Area:     9.99 cm LA Vol (A2C):   47.9 ml 27.14 ml/m RA Volume:   19.60 ml 11.11 ml/m LA Vol (A4C):   41.7 ml 23.63 ml/m LA Biplane Vol: 45.0 ml 25.50 ml/m  AORTIC VALVE LVOT Vmax:   93.80 cm/s LVOT Vmean:  62.700 cm/s LVOT VTI:    0.224 m MITRAL VALVE                TRICUSPID VALVE MV Area (PHT): 2.45 cm     TR Peak grad:   15.7 mmHg MV Decel Time: 310 msec     TR Vmax:        198.00 cm/s MV E velocity: 84.60 cm/s MV A velocity: 104.00 cm/s  SHUNTS MV E/A ratio:  0.81         Systemic VTI: 0.22 m Gwyndolyn Kaufman MD Electronically signed by Gwyndolyn Kaufman MD Signature Date/Time: 05/27/2020/11:11:34 AM    Final     VAS [place] LOWER EXTREMITY VENOUS (DVT)   Result Date: 05/27/2020  Lower Venous DVT Study Indications: Stroke.  Comparison Study: no prior Performing Technologist: Abram Sander RVS  Examination Guidelines: A complete evaluation includes B-mode imaging, spectral Doppler, color Doppler, and power Doppler as needed of  all accessible portions of each vessel. Bilateral testing is considered an integral part of a complete examination. Limited examinations for reoccurring indications may be performed as noted. The reflux portion of the exam is performed with the patient in reverse Trendelenburg.  +---------+---------------+---------+-----------+----------+--------------+ RIGHT    CompressibilityPhasicitySpontaneityPropertiesThrombus Aging +---------+---------------+---------+-----------+----------+--------------+ CFV      Full           Yes      Yes                                 +---------+---------------+---------+-----------+----------+--------------+ SFJ      Full                                                        +---------+---------------+---------+-----------+----------+--------------+  FV Prox  Full                                                        +---------+---------------+---------+-----------+----------+--------------+ FV Mid   Full                                                        +---------+---------------+---------+-----------+----------+--------------+ FV DistalFull                                                        +---------+---------------+---------+-----------+----------+--------------+ PFV      Full                                                        +---------+---------------+---------+-----------+----------+--------------+ POP      Full           Yes      Yes                                 +---------+---------------+---------+-----------+----------+--------------+ PTV      Full                                                        +---------+---------------+---------+-----------+----------+--------------+ PERO     Full                                                        +---------+---------------+---------+-----------+----------+--------------+    +---------+---------------+---------+-----------+----------+--------------+ LEFT     CompressibilityPhasicitySpontaneityPropertiesThrombus Aging +---------+---------------+---------+-----------+----------+--------------+ CFV      Full           Yes      Yes                                 +---------+---------------+---------+-----------+----------+--------------+ SFJ      Full                                                        +---------+---------------+---------+-----------+----------+--------------+ FV Prox  Full                                                        +---------+---------------+---------+-----------+----------+--------------+  FV Mid   Full                                                        +---------+---------------+---------+-----------+----------+--------------+ FV DistalFull                                                        +---------+---------------+---------+-----------+----------+--------------+ PFV      Full                                                        +---------+---------------+---------+-----------+----------+--------------+ POP      Full           Yes      Yes                                 +---------+---------------+---------+-----------+----------+--------------+ PTV      Full                                                        +---------+---------------+---------+-----------+----------+--------------+ PERO     Full                                                        +---------+---------------+---------+-----------+----------+--------------+     Summary: BILATERAL: - No evidence of deep vein thrombosis seen in the lower extremities, bilaterally. - No evidence of superficial venous thrombosis in the lower extremities, bilaterally. -No evidence of popliteal cyst, bilaterally.   *See table(s) above for measurements and observations. Electronically signed by Deitra Mayo MD on  05/27/2020 at 1:48:00 PM.    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 Does the need for close, 24 hr/day medical supervision in concert with the patient's rehab needs make it unreasonable for this patient to be served in a less intensive setting? Yes 2. Co-Morbidities requiring supervision/potential complications: Left triquetral fracture 3. Due to bladder management, bowel management, safety, skin/wound care, disease management, medication administration, pain management and patient education, does the patient require 24 hr/day rehab nursing? Yes 4. Does the patient require coordinated care of a physician, rehab nurse, therapy disciplines of PT, OT, SLP to address physical and functional deficits in the context of the above medical diagnosis(es)? Yes Addressing deficits in the following areas: balance, endurance, locomotion, strength, transferring, bowel/bladder control, bathing, dressing, toileting, cognition and psychosocial support 5. Can the patient actively participate in an intensive therapy program of at least 3 hrs of therapy per day at least 5 days per week? Yes 6. The potential for patient to make measurable gains while  on inpatient rehab is good 7. Anticipated functional outcomes upon discharge from inpatient rehab are min assist  with PT, min assist with OT, modified independent with SLP. 8. Estimated rehab length of stay to reach the above functional goals is: 18-21d 9. Anticipated discharge destination: Home 10. Overall Rehab/Functional Prognosis: good   RECOMMENDATIONS: This patient's condition is appropriate for continued rehabilitative care in the following setting: CIR Patient has agreed to participate in recommended program. Yes Note that insurance prior authorization may be required for reimbursement for recommended care.   Comment: needs to complete acute care w/u '   Pamela S Love, PA-C 05/28/2020

## 2020-06-02 NOTE — Progress Notes (Signed)
[name]   Subjective/Complaints: Complains of some sore throat- received benadryl yesterday which helped. Offered lozenges and she is agreeable. WBC stable. Discussed with Gregary Signs OT that patient is progressing very well. She asks if WB status has potential to be downgraded as she is not having pain.   ROS: +sore throat  Objective:   No results found. Recent Labs    06/01/20 0517 06/02/20 0607  WBC 5.8 6.8  HGB 12.1 12.6  HCT 38.6 39.0  PLT 319 332   Recent Labs    06/01/20 0517  NA 139  K 4.0  CL 102  CO2 26  GLUCOSE 106*  BUN 18  CREATININE 0.87  CALCIUM 8.9    Intake/Output Summary (Last 24 hours) at 06/02/2020 1047 Last data filed at 06/02/2020 0815 Gross per 24 hour  Intake 657 ml  Output --  Net 657 ml        Physical Exam: Vital Signs Blood pressure (!) 133/58, pulse 75, temperature 98.7 F (37.1 C), resp. rate 17, height 5\' 4"  (1.626 m), weight 69.2 kg, SpO2 95 %. Gen: no distress, normal appearing HEENT: oral mucosa pink and moist, NCAT Cardio: Reg rate Chest: normal effort, normal rate of breathing Abd: soft, non-distended Ext: no edema Skin: intact Musculoskeletal:     Comments: RUE- biceps, triceps, WE, 4+/5 and grip and finger abd 5-/5 LUE- biceps, triceps 4+/5, WE- NT due ot fx, grip and finger abd 5-./5 RLE- HF, KE, KF ,DF and PF 5/5 LLE- 5-/5 in same muscles B/L   Skin:    General: Skin is warm and dry.     Comments: L wrist in splint wrapped with ACE wrap- fingers moving and hand warm   Neurological:     Mental Status: She is alert and oriented to person, place, and time.     Comments: Light touch decreased from L knee down to foot Intact on R side- LUE- intact to light touch as well Slowed processing- needed daughter to fill in info for her.   Psychiatric:        Mood and Affect: Mood normal.        Behavior: Behavior normal.   Assessment/Plan: 1. Functional deficits which  require 3+ hours per day of interdisciplinary therapy in a comprehensive inpatient rehab setting.  Physiatrist is providing close team supervision and 24 hour management of active medical problems listed below.  Physiatrist and rehab team continue to assess barriers to discharge/monitor patient progress toward functional and medical goals  Care Tool:  Bathing    Body parts bathed by patient: Right arm,Left arm,Chest,Abdomen,Front perineal area,Buttocks,Face         Bathing assist Assist Level: Minimal Assistance - Patient > 75%     Upper Body Dressing/Undressing Upper body dressing   What is the patient wearing?: Pull over shirt    Upper body assist Assist Level: Minimal Assistance - Patient > 75%    Lower Body Dressing/Undressing Lower body dressing      What is the patient wearing?: Underwear/pull up,Pants     Lower body assist Assist for lower body dressing: Contact Guard/Touching assist     Toileting Toileting    Toileting assist Assist for toileting: Minimal Assistance - Patient > 75%     Transfers Chair/bed transfer  Transfers assist     Chair/bed transfer assist level: Minimal Assistance - Patient > 75%     Locomotion Ambulation   Ambulation assist      Assist  level: Minimal Assistance - Patient > 75% (NO ad) Assistive device: No Device Max distance: 50   Walk 10 feet activity   Assist     Assist level: Minimal Assistance - Patient > 75% Assistive device: No Device   Walk 50 feet activity   Assist    Assist level: Minimal Assistance - Patient > 75% Assistive device: No Device    Walk 150 feet activity   Assist Walk 150 feet activity did not occur: Safety/medical concerns         Walk 10 feet on uneven surface  activity   Assist     Assist level: Moderate Assistance - Patient - 50 - 74% Assistive device: Other (comment) (NO DEVICE, MIN ASSIST W/PFRW)   Wheelchair     Assist Will patient use wheelchair at  discharge?: No             Wheelchair 50 feet with 2 turns activity    Assist            Wheelchair 150 feet activity     Assist          Blood pressure (!) 133/58, pulse 75, temperature 98.7 F (37.1 C), resp. rate 17, height 5\' 4"  (1.626 m), weight 69.2 kg, SpO2 95 %.   Medical Problem List and Plan: 1.  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  Antithrombotics: -DVT/anticoagulation:  Pharmaceutical: Lovenox             -antiplatelet therapy: DAPT 3. Pain Management: ultram and flexeril prn- added lidoderm patches to neck 9pm to 9am and kpad for heat. Continue current regimen.  4. Mood:  LCSW to follow for evaluation and support.              -antipsychotic agents: N/A 5. Neuropsych: This patient is capable of making decisions on her own behalf. 6. Skin/Wound Care: Routine pressure relief measures.  7. Fluids/Electrolytes/Nutrition: Monitor I/O.  Electrolytes are stable- continue to monitor weekly.  8. Dyslipidemia: Continue Lipitor.  9. L wrist- triquetral avulsion fx- NWB on LUE- can use platform walker. Ordered XR 12/20 to assess healing.  10. Constipation- having regular BM- continue 3 prunes daily 11. 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    LOS: 2 days A FACE TO FACE EVALUATION WAS PERFORMED  Clide Deutscher Jiovanni Heeter 06/02/2020, 10:47 AM

## 2020-06-02 NOTE — Progress Notes (Addendum)
Physical Therapy Session Note  Patient Details  Name: [name] MRN: [id number] Date of Birth: August 09, 1940  Today's Date: 06/02/2020 PT Individual Time: 0835-0930 PT Individual Time Calculation (min): 55 min   Short Term Goals: Week 1:  PT Short Term Goal 1 (Week 1): STGS=LTGS  Skilled Therapeutic Interventions/Progress Updates:    pt received in bed and agreeable to therapy. Pt denied pain at start and end of session. Pt directed in supine>sit supervision, Sit to stand to platform walker supervision with VC for hand placement. Pt then directed in gait training with platform walker for 200' to gym at [facility] with noted L trunk lean pt reports this due to leg length discrepancy, VC to attempt midline posture and trunk extension with fair effect. Pt directed in BERG balance test without AD and  demonstrates increased fall risk as noted by score of  35 /56 on Berg Balance Scale.  (<36= high risk for falls, close to 100%; 37-45 significant >80%; 46-51 moderate >50%; 52-55 lower >25%). Pt directed in gait to stairs with platform walker 60' CGA, directed in ascending/descending x4 stairs with one handrail CGA with VC for sequencing and education on performing stairs at home, pt reports she only needs to navigate one step to enter home but has no handrail, pt unable to complete one step without handrail during session and required use of one on Rt. Pt then directed in gait training with platform walker to return to room 200' CGA VC for stride length and posture with good effect. Pt requested to remain in recliner at end of session, setup room for this. Pt directed in x5 Sit to stand to platform walker at [facility] quickly improving to supervision with VC for hand placement and BLE setup. Pt left in recliner, alarm set, All needs in reach and in good condition. Call light in hand.  PT educated pt on BERG scores and meaning and pt verbalized understanding and reports having a better insight to deficits.   Therapy  Documentation Precautions:  Precautions Precautions: Fall Precaution Comments: please have patient wear her shoes for transfer/ambulation (left with lift for leg length discrepancy) Required Braces or Orthoses: Splint/Cast Splint/Cast: L wrist soft slplint Restrictions Weight Bearing Restrictions: Yes LUE Weight Bearing: Non weight bearing Other Position/Activity Restrictions: Assumed NWB on L wrist General:   Vital Signs:  Pain:   Mobility:   Locomotion :    Trunk/Postural Assessment :    Balance: Balance Balance Assessed: Yes Standardized Balance Assessment Standardized Balance Assessment: Berg Balance Test Berg Balance Test Sit to Stand: Able to stand  independently using hands Standing Unsupported: Able to stand 2 minutes with supervision Sitting with Back Unsupported but Feet Supported on Floor or Stool: Able to sit safely and securely 2 minutes Stand to Sit: Uses backs of legs against chair to control descent Transfers: Able to transfer safely, definite need of hands Standing Unsupported with Eyes Closed: Able to stand 10 seconds with supervision Standing Ubsupported with Feet Together: Able to place feet together independently and stand for 1 minute with supervision From Standing, Reach Forward with Outstretched Arm: Can reach forward >12 cm safely (5") From Standing Position, Pick up Object from Floor: Able to pick up shoe, needs supervision From Standing Position, Turn to Look Behind Over each Shoulder: Turn sideways only but maintains balance Turn 360 Degrees: Able to turn 360 degrees safely but slowly Standing Unsupported, Alternately Place Feet on Step/Stool: Able to complete >2 steps/needs minimal assist Standing Unsupported, One Foot in Front:  Able to take small step independently and hold 30 seconds Standing on One Leg: Tries to lift leg/unable to hold 3 seconds but remains standing independently Total Score: 35 Exercises:   Other Treatments:       Therapy/Group: Individual Therapy  Junie Panning 06/02/2020, 10:00 AM

## 2020-06-02 NOTE — Discharge Summary (Signed)
Physician Discharge Summary  Patient ID: [name] MRN: [id number] DOB/AGE: 06/22/1940 79 y.o.  Admit date: 05/31/2020 Discharge date: 06/06/2020  Discharge Diagnoses:  Principal Problem:   Stroke (cerebrum) [facility]) Active Problems:   Triquetral fracture   Chronic neck pain   Constipation   Discharged Condition: stable   Significant Diagnostic Studies: DG Wrist Complete Left  Result Date: 06/02/2020 CLINICAL DATA:  Left wrist fracture. EXAM: LEFT WRIST - COMPLETE 3+ VIEW COMPARISON:  Radiographs 05/26/2020. FINDINGS: The wrist has been splinted, obscuring bone detail. Small ossific density dorsal to the carpus on the lateral view is unchanged. This could reflect a small avulsion fracture or sequela of remote trauma. No other evidence of acute fracture or dislocation. There are moderately advanced degenerative changes at the scaphotrapeziotrapezoidal and 1st carpometacarpal articulations. IMPRESSION: Stable small ossific density dorsal to the carpus may reflect a small avulsion fracture or sequela of remote trauma. No other acute findings. Electronically Signed   By: Richardean Sale M.D.   On: 06/02/2020 16:01      Labs:  Basic Metabolic Panel: BMP Latest Ref Rng & Units 06/01/2020 05/29/2020 05/27/2020  Glucose 70 - 99 mg/dL 106(H) 112(H) 93  BUN 8 - 23 mg/dL 18 13 9   Creatinine 0.44 - 1.00 mg/dL 0.87 0.93 0.86  Sodium 135 - 145 mmol/L 139 138 138  Potassium 3.5 - 5.1 mmol/L 4.0 3.8 3.7  Chloride 98 - 111 mmol/L 102 103 104  CO2 22 - 32 mmol/L 26 26 26   Calcium 8.9 - 10.3 mg/dL 8.9 8.9 9.0    CBC: Recent Labs  Lab 06/01/20 0517 06/02/20 0607  WBC 5.8 6.8  NEUTROABS 3.2  --   HGB 12.1 12.6  HCT 38.6 39.0  MCV 92.1 92.9  PLT 319 332    CBG: No results for input(s): GLUCAP in the last 168 hours.  Brief HPI:   [name] is a 79 y.o. female with history of renal cancer, peripheral neuropathy, left hip fracture with left leg length discrepancy, chronic pain  who was admitted on 05/26/2020 after 2 to 3 days of progressive numbness in LLE resulting in fall with inability to walk as well as left wrist fracture.  LLE weakness resolved in ED and patient did not receive TPA.  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  She did have recurrence and LLE weakness and stroke felt to be embolic due to unknown source.  Loop recorder was placed to rule out A. fib and Dr. Erlinda Hong recommended DAPT x3 months followed by aspirin alone.  Left wrist splint placed to manage fracture peripheral avulsion fracture with recommendations of NWB.  Chronic right shoulder pain treated with cortisone injection and improvement in pain.  She continues to be limited by pain neck pain, left-sided weakness with numbness as well as nonweightbearing on left wrist.  Therapy ongoing and CIR was recommended due to functional decline.   Hospital Course: [name] was admitted to rehab 05/31/2020 for inpatient therapies to consist of PT, ST and OT at least three hours five days a week. Past admission physiatrist, therapy team and rehab RN have worked together to provide customized collaborative inpatient rehab.  She was maintained on DAPT during her stay.  Follow-up CBC shows H&H and platelets to be stable.  Check of electrolytes shows renal status to be within normal limits.  She continues NWB LUE and  follow-up x-ray appears stable.  She was started on bowel regimen to help manage acute on chronic constipation and this has been effective.  Acute on chronic neck pain has been managed with use of lidocaine patch daily as well as Flexeril on prn basis.  She has made steady gains during her rehab stay and supervision is recommended for safety. She will continue to receive follow up outpatient PT at [facility] after discharge.    Rehab course: During patient's stay in  rehab weekly team conferences were held to monitor patient's progress, set goals and discuss barriers to discharge. At admission, patient required min assist with mobility and with ADL tasks. She  has had improvement in activity tolerance, balance, postural control as well as ability to compensate for deficits. She has had improvement in functional use LUE  and LLE as well as improvement in awareness.  She is able to complete ADL tasks with supervision. She requires supervision with cues for safety due to need of platform rolling walker with mobility.  She is able to complete functional and familiar complex tasks at modified independent level.  Family education was completed.  Disposition: home  Diet: Heart healthy  Special Instructions: 1. No driving or strenuous activity till cleared by MD.  2. No weight on left hand till follow-up with orthopedics.    Discharge Instructions    Ambulatory referral to Physical Medicine Rehab   Complete by: As directed    1-2 weeks TC appt     Allergies as of 06/06/2020      Reactions   Codeine Other (See Comments)   Pt unsure if really allergic to this      Medication List    STOP taking these medications   omeprazole 40 MG capsule Commonly known as: PRILOSEC   traMADol 50 MG tablet Commonly known as: ULTRAM     TAKE these medications   acetaminophen 650 MG CR tablet Commonly known as: TYLENOL Take 650 mg by mouth daily.   aspirin 81 MG EC tablet Take 1 tablet (81 mg total) by mouth daily. Swallow whole. Notes to patient: Purchase over the counter   atorvastatin 40 MG tablet Commonly known as: LIPITOR Take 1 tablet (40 mg total) by mouth daily.   clopidogrel 75 MG tablet Commonly known as: PLAVIX Take 1 tablet (75 mg total) by mouth daily.   cyclobenzaprine 5 MG tablet Commonly known as: FLEXERIL Take 1 tablet (5 mg total) by mouth 3 (three) times daily as needed for muscle spasms.   lidocaine 5 % Commonly known as:  LIDODERM Place 2 patches onto the skin daily. On for 12 hours and has to be off for 12 hrs. Purchase this over the counter. Notes to patient: Purchase over the counter--place on neck/shoulder at 8 pm and remove at 8 am daily   MAGNESIUM PO Take 1 tablet by mouth every evening.   multivitamin with minerals tablet Take 1 tablet by mouth daily.   pantoprazole 40 MG tablet Commonly known as: PROTONIX Take 1 tablet (40 mg total) by mouth daily.   polyethylene glycol 17 g packet Commonly known as: MIRALAX / GLYCOLAX Take 17 g by mouth daily. Notes to patient: For constipation--over the counter   senna-docusate 8.6-50 MG tablet Commonly known as: Senokot-S Take 1 tablet by mouth 2 (two) times daily. Notes to patient: For constipation--over the counter   vitamin C 500 MG tablet Commonly known as: ASCORBIC ACID Take 500 mg by mouth daily.  Follow-up Information    Kirsteins, Luanna Salk, MD Follow up.   Specialty: Physical Medicine and Rehabilitation Why: Office will call you with follow up appointment Contact information: [place] [address] [phone]        Hulan Fess, MD. Call.   Specialty: Family Medicine Why: for post hospital follow up Contact information: [place] [address] [phone]        Netta Cedars, MD Follow up.   Specialty: Orthopedic Surgery Why: for follow up on wrist. Contact information: [address] [place] [address] [phone]               Signed: Bary Leriche 06/06/2020, 12:02 PM

## 2020-06-02 NOTE — Progress Notes (Addendum)
PMR Admission Coordinator Pre-Admission Assessment   Patient: [name] is an 79 y.o., female MRN: [id number] DOB: 09-03-1940 Height: '5\' 4"'  (162.6 cm) Weight: 69.2 kg                                                                                                                                                  Insurance Information HMO:     PPO: yes     PCP:      IPA:      80/20:      OTHER:  PRIMARY: [employer]      Policy#: [id number]      Subscriber: pt CM Name: [name]      Phone#: [phone]     Fax#: EPIC access Pre-Cert#: [id number] auth for CIR provided by Tammy with [employer].  Update due weekly, and [employer] has epic access      Employer: n/a Benefits:  Phone #: [phone]     Name: n/a Eff. Date: 06/15/19     Deduct: $0      Out of Pocket Max: $3400 (met $126.76)      Life Max: n/a  CIR: $295/day for days 1-6      SNF: 20 full days Outpatient:      Co-Pay: $20/visit Home Health: 100%      Co-Pay:  DME: 80%     Co-Pay: 20% Providers:  SECONDARY:       Policy#:       Phone#:    Development worker, community:       Phone#:    The Therapist, art Information Summary" for patients in Inpatient Rehabilitation Facilities with attached "Privacy Act [place] Records" was provided and verbally reviewed with: Patient and Family   Emergency Contact Information         Contact Information     Name Relation Home Work Mobile    Swinger,[name] Spouse [phone]   [phone]       Current Medical History  Patient Admitting Diagnosis: CVA    History of Present Illness: [name] is a 79 y.o. 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  During her ED stay left lower extremity weakness had resolved and she did not  receive tPA.  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  2D echo showed EF 65% with no wall and mild aortic valve sclerosis.  BLE Dopplers were negative for DVT.  Patient had recurrent LLE weakness  and  Dr. 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  Loop recorder recommended to rule out A fib and DAPT x3 months to be followed by ASA alone.  Patient with resultant right sided weakness with numbness, posterior lean with sitting and inability to stand.  Assumed to be NWB Left wrist due to triquetral avulsion fracture.  Pt states wrist pain started after fall ~2d PTA.  Also notes an episode of LLE numbness occurring >several days PTA but this occurred after resting left lateral knee against hand of cabinet while sitting at her bar. Fall occurred 2d prior to admission, pt developed Left side weakness on day of admission shortly before a scheduled orthopedica appt.     Patient has been reporting right shoulder pain since fall and has difficulty with extension at shoulder; post cortisone injection reporting improved pain.  Therapy evaluations completed and patient showing excellent progress, with recommendations for CIR.      Past Medical History      Past Medical History:  Diagnosis Date  . Blood transfusion without reported diagnosis      with hip fracture-79 years old  . Cancer [facility]) 1983    Kidney Cancer  . Diverticulosis    . GERD (gastroesophageal reflux disease)    . Personal history of kidney cancer        Family History  family history includes Colon cancer in her mother; Coronary artery disease in her father.   Prior Rehab/Hospitalizations:  Has the patient had prior rehab or hospitalizations prior to admission? No   Has the patient had major surgery  during 100 days prior to admission? Yes   Current Medications    Current Facility-Administered Medications:  .  acetaminophen (TYLENOL) tablet 325-650 mg, 325-650 mg, Oral, Q4H PRN, Bary Leriche, PA-C, 650 mg at 06/01/20 0755 .  alum & mag hydroxide-simeth (MAALOX/MYLANTA) 200-200-20 MG/5ML suspension 30 mL, 30 mL, Oral, Q4H PRN, Love, Pamela S, PA-C .  aspirin EC tablet 81 mg, 81 mg, Oral, Daily, Love, Pamela S, PA-C, 81 mg at 06/01/20 0745 .  atorvastatin (LIPITOR) tablet 40 mg, 40 mg, Oral, Daily, Love, Pamela S, PA-C, 40 mg at 06/01/20 0745 .  bisacodyl (DULCOLAX) suppository 10 mg, 10 mg, Rectal, Daily PRN, Love, Pamela S, PA-C .  clopidogrel (PLAVIX) tablet 75 mg, 75 mg, Oral, Daily, Love, Pamela S, PA-C, 75 mg at 06/01/20 0745 .  cyclobenzaprine (FLEXERIL) tablet 5 mg, 5 mg, Oral, TID PRN, Lovorn, Megan, MD, 5 mg at 06/01/20 0755 .  diphenhydrAMINE (BENADRYL) 12.5 MG/5ML elixir 12.5-25 mg, 12.5-25 mg, Oral, Q6H PRN, Love, Pamela S, PA-C .  enoxaparin (LOVENOX) injection 40 mg, 40 mg, Subcutaneous, Q24H, Love, Pamela S, PA-C, 40 mg at 05/31/20 1853 .  guaiFENesin-dextromethorphan (ROBITUSSIN DM) 100-10 MG/5ML syrup 5-10 mL, 5-10 mL, Oral, Q6H PRN, Love, Pamela S, PA-C .  lidocaine (LIDODERM) 5 % 2 patch, 2 patch, Transdermal, Q24H, Lovorn, Megan, MD .  pantoprazole (PROTONIX) EC tablet 40 mg, 40 mg, Oral, Daily, Love, Pamela S, PA-C, 40 mg at 06/01/20 0745 .  polyethylene glycol (MIRALAX / GLYCOLAX) packet 17 g, 17 g, Oral, Daily, Love, Pamela S, PA-C, 17 g at 06/01/20 0745 .  polyethylene glycol (MIRALAX / GLYCOLAX) packet 17 g, 17 g, Oral, Daily PRN, Love, Pamela S, PA-C .  prochlorperazine (COMPAZINE) tablet 5-10 mg, 5-10 mg, Oral, Q6H PRN **OR** prochlorperazine (COMPAZINE) injection 5-10 mg, 5-10 mg, Intramuscular, Q6H PRN **OR** prochlorperazine (COMPAZINE) suppository 12.5 mg, 12.5 mg, Rectal, Q6H  PRN, Love, Pamela S, PA-C .  senna-docusate (Senokot-S) tablet 1 tablet, 1 tablet,  Oral, BID, Love, Pamela S, PA-C, 1 tablet at 06/01/20 0745 .  sodium phosphate (FLEET) 7-19 GM/118ML enema 1 enema, 1 enema, Rectal, Once PRN, Love, Pamela S, PA-C .  sorbitol 70 % solution 30 mL, 30 mL, Oral, Once, Lovorn, Megan, MD .  traMADol (ULTRAM) tablet 50 mg, 50 mg, Oral, Q6H PRN, Love, Pamela S, PA-C .  traZODone (DESYREL) tablet 25-50 mg, 25-50 mg, Oral, QHS PRN, Love, Pamela S, PA-C   Patients Current Diet:     Diet Order                      Diet Heart Room service appropriate? Yes; Fluid consistency: Thin  Diet effective now                      Precautions / Restrictions Restrictions Weight Bearing Restrictions: Yes LUE Weight Bearing: Weight bearing as tolerated    Has the patient had 2 or more falls or a fall with injury in the past year?Yes   Prior Activity Level Independent prior to admit.  Driving.  No DME for mobility or ADLs, though does have a leg length discrepancy with a built up shoe for LLE.   Prior Functional Level   Self Care: Did the patient need help bathing, dressing, using the toilet or eating?  Independent   Indoor Mobility: Did the patient need assistance with walking from room to room (with or without device)? Independent   Stairs: Did the patient need assistance with internal or external stairs (with or without device)? Independent   Functional Cognition: Did the patient need help planning regular tasks such as shopping or remembering to take medications? Independent   Home Assistive Devices / Equipment Home Assistive Devices/Equipment: None   Prior Device Use: Indicate devices/aids used by the patient prior to current illness, exacerbation or injury? None of the above   Current Functional Level Cognition   Orientation Level: Oriented X4    Extremity Assessment (includes Sensation/Coordination)            ADLs UB Bathing: min assist LB Bathing: Max assist Toilet transfer: mod assist          Mobility Bed Mobility: mod  assist for supine to/from sit         Transfers min assist to stand, mod assist to pivot         Ambulation / Gait / Stairs / Min assist x60' with L PRW                Special needs/care consideration n/a        Previous Home Environment (from acute therapy documentation) Living Arrangements: Spouse/significant other Home Care Services: No   Discharge Living Setting Does the patient have any problems obtaining your medications?: No   Social/Family/Support Systems Patient Roles: Spouse Anticipated Caregiver: Spouse Lucila Maine) and Daughter Lattie Haw, in [place]) Caregiver contact info: Lucila Maine [phone] Limitations of caregiver: Lucila Maine can provide supervision only Availability: 24/7 Discharge plan Discussed with caregiver: yes Caregiver in agreement with plan: yes Issues with lodging/transportation: no    Goals  Pt/Family Goals for Rehab: PT/OT Supervision, SLP independent Expected LOS: 10-14 days Pt/Family agrees to admit: yes Program reviewed with pt/caregiver: yes Barriers to d/c: insurance for SNF coverage   Decrease burden of Care through IP rehab admission: n/a    Possible need for SNF placement upon discharge: Not  anticipated   Patient Condition: This patient's medical and functional status has changed since the consult dated: 05/28/20 in which the Rehabilitation Physician determined and documented that the patient's condition is appropriate for intensive rehabilitative care in an inpatient rehabilitation facility. See "History of Present Illness" (above) for medical update. Functional changes are: pt min +2 with mobility and ADLs. Patient's medical and functional status update has been discussed with the Rehabilitation physician and patient remains appropriate for inpatient rehabilitation. Will admit to inpatient rehab Saturday.   Preadmission Screen Completed By:  Courtney Heys, MD, 06/01/2020 10:31  AM ______________________________________________________________________   Discussed status with Dr. Dagoberto Ligas on 06/01/20 at 10:31 AM  and received approval for admission Saturday.   Admission Coordinator:  Courtney Heys, PT, DPT time 10:31 AM Sudie Grumbling 06/01/20

## 2020-06-02 NOTE — Progress Notes (Signed)
Occupational Therapy Session Note  Patient Details  Name: [name] MRN: [id number] Date of Birth: 1941/06/02  Today's Date: 06/02/2020 OT Individual Time: 3403-7096 OT Individual Time Calculation (min): 70 min    Short Term Goals: Week 1:  OT Short Term Goal 1 (Week 1): STG = LTG due to anticipated LOS  Skilled Therapeutic Interventions/Progress Updates:    Pt receive d in recliner dressed and ready for the day. She stated she put these clothes on yesterday and did not want to bathe today but she did want to change her underwear.  Pt stood from recliner and ambulated to bathroom with light CGA (using platform RW).  In bathroom, she doffed underwear and pants, donned new underwear and pants, slip on shoes, toileted and cleansed all with S.  Ambulated back to recliner.  Then worked on balance exercises in standing with support from arm chair.  She worked on heel raises, lateral leg lifts for 12 reps 3 sets.  From seated worked on AROM of B shoulders with sh circle exercises.   Pt states she has a RW, shower seat at home. She states her spouse is quite independent and able to assist her at home.     MD will clarify weight bearing status on L hand to determine if pt will need a platform RW for home.  Pt resting in recliner with all needs met and chair pad alarm on.   Therapy Documentation Precautions:  Precautions Precautions: Fall Precaution Comments: please have patient wear her shoes for transfer/ambulation (left with lift for leg length discrepancy) Required Braces or Orthoses: Splint/Cast Splint/Cast: L wrist soft slplint Restrictions Weight Bearing Restrictions: Yes LUE Weight Bearing: Non weight bearing Other Position/Activity Restrictions: Assumed NWB on L wrist     Pain: minimal c/o pain in L thumb   ADL: ADL Eating: Set up Where Assessed-Eating: Bed level Grooming: Setup,Supervision/safety Where Assessed-Grooming: Wheelchair,Sitting at sink Upper Body  Bathing: Minimal assistance Where Assessed-Upper Body Bathing: Wheelchair,Sitting at sink Lower Body Bathing: Minimal assistance Where Assessed-Lower Body Bathing: Wheelchair,Sitting at sink Upper Body Dressing: Minimal assistance Where Assessed-Upper Body Dressing: Wheelchair Lower Body Dressing: Contact guard Where Assessed-Lower Body Dressing: Wheelchair Toileting: Contact guard Where Assessed-Toileting: Glass blower/designer: Therapist, music Method: Counselling psychologist: Grab bars  Therapy/Group: Individual Therapy  [name] 06/02/2020, 11:12 AM

## 2020-06-02 NOTE — Progress Notes (Signed)
Inpatient Rehabilitation Care Coordinator Assessment and Plan Patient Details  Name: [name] MRN: [id number] Date of Birth: 13-Jun-1941  Today's Date: 06/02/2020  Hospital Problems: Principal Problem:   Stroke (cerebrum) [facility]) Active Problems:   Triquetral fracture  Past Medical History:  Past Medical History:  Diagnosis Date  . Blood transfusion without reported diagnosis    with hip fracture-79 years old  . Cancer [facility]) 1983   Kidney Cancer  . Diverticulosis   . GERD (gastroesophageal reflux disease)   . Personal history of kidney cancer    Past Surgical History:  Past Surgical History:  Procedure Laterality Date  . ABDOMINAL HYSTERECTOMY    . COLON SURGERY     Severe sigmoid diverticulitis  . COLONOSCOPY    . FOOT SURGERY    . HIP SURGERY    . Kidney cancer     Kidney removed  . LOOP RECORDER INSERTION N/A 05/30/2020   Procedure: LOOP RECORDER INSERTION;  Surgeon: Vickie Epley, MD;  Location: [facility];  Service: Cardiovascular;  Laterality: N/A;  . POLYPECTOMY     Social History:  reports that she has never smoked. She has never used smokeless tobacco. She reports that she does not drink alcohol and does not use drugs.  Family / Support Systems Patient Roles: Spouse Spouse/Significant Other: [name] Children: Lattie Haw (lives in [place]) Anticipated Caregiver: spouse and daugter Ability/Limitations of Caregiver: spouse can provide supervision Caregiver Availability: 24/7  Social History Preferred language: English Religion:  Read: Yes Write: Yes Employment Status: Retired   Abuse/Neglect Abuse/Neglect Assessment Can Be Completed: Yes Physical Abuse: Denies Verbal Abuse: Denies Sexual Abuse: Denies Exploitation of patient/patient's resources: Denies Self-Neglect: Denies  Emotional Status Pt's affect, behavior and adjustment status: no Recent Psychosocial Issues: no Psychiatric History: no Substance Abuse History: no  Patient /  Family Perceptions, Expectations & Goals Pt/Family understanding of illness & functional limitations: yes Premorbid pt/family roles/activities: Previously Independent, driving Anticipated changes in roles/activities/participation: Supervision to MOD I Pt/family expectations/goals: [employer]: None Premorbid Home Care/DME Agencies: None Transportation available at discharge: Family able to transport  Discharge Planning Living Arrangements: Spouse/significant other (Supervision) [place]: Spouse/significant other,Children (Dauhgter lives in [place]) Type of Residence: Private residence (1 level home, 1 step to enter (half ramp ordered), no railings) Insurance Resources: Multimedia programmer (specify) (HTA) Financial Resources: Radio broadcast assistant Screen Referred: No Living Expenses: Own Money Management: Patient,Spouse Does the patient have any problems obtaining your medications?: No Home Management: Independent Patient/Family Preliminary Plans: Spouse able to provide supervision Care Coordinator Barriers to Discharge: Decreased caregiver support Care Coordinator Anticipated Follow Up Needs: HH/OP Expected length of stay: 10-12 Days  Clinical Impression SW entered room, introduced self, explained role and process, spouse at bedside. Patient having lunch. Informed sw of plan to discharge by Friday, sw informed pt that physician and therapist will keep SW updated on d/c date. Pt plans to discharge home MOD I to Supervision. Spouse (uses RW-can provide supervision) and Daughter (Lives in [place]) will be patient primary caregiver. Pt lives in 1 level home, 1 step to enter (no railings). No additional questions or questions, sw will continue to follow up.   Dyanne Iha 06/02/2020, 12:57 PM

## 2020-06-02 NOTE — Progress Notes (Signed)
[facility] Individual Statement of Services  Patient Name:  [name]  Date:  06/02/2020  Welcome to the [facility].  Our goal is to provide you with an individualized program based on your diagnosis and situation, designed to meet your specific needs.  With this comprehensive rehabilitation program, you will be expected to participate in at least 3 hours of rehabilitation therapies Monday-Friday, with modified therapy programming on the weekends.  Your rehabilitation program will include the following services:  Physical Therapy (PT), Occupational Therapy (OT), Speech Therapy (ST), 24 hour per day rehabilitation nursing, Therapeutic Recreaction (TR), Neuropsychology, Care Coordinator, Rehabilitation Medicine, Nutrition Services, Pharmacy Services and Other  Weekly team conferences will be held on Wednesday to discuss your progress.  Your Inpatient Rehabilitation Care Coordinator will talk with you frequently to get your input and to update you on team discussions.  Team conferences with you and your family in attendance may also be held.  Expected length of stay: 7-10 Days  Overall anticipated outcome: MOD I to Supervision   Depending on your progress and recovery, your program may change. Your Inpatient Rehabilitation Care Coordinator will coordinate services and will keep you informed of any changes. Your Inpatient Rehabilitation Care Coordinator's name and contact numbers are listed  below.  The following services may also be recommended but are not provided by the [facility]:    [place] will be made to provide these services after discharge if needed.  Arrangements include referral to agencies that provide these services.  Your insurance has been verified to be:  [employer] Your primary doctor is:  Hulan Fess, MD  Pertinent  information will be shared with your doctor and your insurance company.  Inpatient Rehabilitation Care Coordinator:  Erlene Quan, [place] or [phone]  Information discussed with and copy given to patient by: Dyanne Iha, 06/02/2020, 10:20 AM

## 2020-06-02 NOTE — Progress Notes (Signed)
Speech Language Pathology Daily Session Note  Patient Details  Name: [name] MRN: [id number] Date of Birth: [birth year]  Today's Date: 06/02/2020 SLP Individual Time: 1300-1358 SLP Individual Time Calculation (min): 58 min  Short Term Goals: Week 1: SLP Short Term Goal 1 (Week 1): Pt will complete complex ADLs (specifically medication and money management) with mod I.  Skilled Therapeutic Interventions: Pt was seen for skilled ST targeting cognitive goals. Pt's husband was present for session and receptive to educational opportunities (he remained appropriately engaged during session). Pt with excellent recall of familiar medications and regimen, also able to recall new medications with Supervision A level cues. Pt used list making to record all current scheduled and PRN medications, which she used to organize a BID pill box with 1 initial Supervision A level verbal cue for awareness of error and problem solving. Pt demonstrated ability to alternate her attention between different tasks as well as conversation vs functional task Mod I. Divided attention task required Supervision A verbal cueing from SLP. Pt left sitting in recliner with alarm set and needs within reach. Continue per current plan of care.          Pain Pain Assessment Pain Scale: 0-10 Pain Score: 0-No pain  Therapy/Group: Individual Therapy  Arbutus Leas 06/02/2020, 7:28 AM

## 2020-06-03 ENCOUNTER — Inpatient Hospital Stay (HOSPITAL_COMMUNITY): Payer: PPO | Admitting: Physical Therapy

## 2020-06-03 ENCOUNTER — Inpatient Hospital Stay (HOSPITAL_COMMUNITY): Payer: PPO | Admitting: Occupational Therapy

## 2020-06-03 ENCOUNTER — Inpatient Hospital Stay (HOSPITAL_COMMUNITY): Payer: PPO | Admitting: Speech Pathology

## 2020-06-03 NOTE — Progress Notes (Signed)
Physical Therapy Session Note  Patient Details  Name: [name] MRN: [id number] Date of Birth: 17-Feb-1941  Today's Date: 06/03/2020 PT Individual Time: 1313-1428 PT Individual Time Calculation (min): 75 min   Short Term Goals: Week 1:  PT Short Term Goal 1 (Week 1): STGS=LTGS  Skilled Therapeutic Interventions/Progress Updates:    Pt received sitting in recliner with her husband present and pt agreeable to therapy session. Pt/husband confirm they have 1 step-up (~6-8inches high) to get into their house and they're daughter has purchased a 3.5inch height "half-step" (19inches wide) to place in front of the main step to allow easier access into the home. Otherwise, confirm no steps in home. Pt reports they have a small SUV that is easy for her to get in/out of. Sit<>stands using PFRW with close supervision for safety during session - cuing for L UE placement on/off platform using strap for safety. Gait training ~165ft to ortho gym using [place] with CGA progressing to supervision - demos slow gait speed but otherwise reciprocal pattern and adequate foot clearance with no significant postural sway. Simulated SUV height ambulatory car transfer using PFRW with close supervision for safety - pt demos proper sequencing without cuing. Gait training ~30ft up/down ramp using PFRW with CGA for safety - demos more forward management of AD requiring cuing for safety. Gait training ~161ft to main therapy gym using [place], CGA progressing to supervision for safety - same gait mechanics as above. Therapist set-up simulated home entry using 3" height step in front of 8" height step and provided visual demonstration and education on sequencing steps using RW - due to narrow width of pt's "half-step" had pt keep RW on ground while stepping on/off this 1st step. Pt ascended/descended those 2 steps using RW 2x with CGA for steadying and assist for AD management on 1st trial then none on 2nd trial (used strap on platform to  help safely move AD). Dynamic gait training, no AD, ~3ft and ~166ft with CGA and intermittent min assist for balance due to slightly catching either toe during swing - demos slow gait speed, though improving on 2nd walk, forward trunk flexion with lateral flexion due to scoliosis. Repeated sit<>stand to/from EOM, no UE support, 2x10 reps with CGA for steadying - cuing for increased glute activation when coming to stand for improved upright posture. Dynamic standing balance task via alternate B LE foot taps on 4" step, no UE support, with min assist for balance. Gait training ~158ft back to room using [place] with CGA progressing to supervision - noted when turning this time, in more distracting environment, pt has slight L LOB due to lack of adequate L LE lateral step length. Pt reports need to use bathroom - gait in bathroom using PFRW with CGA for safety - standing with supervision performed LB clothing management and sat onto toilet. Left sitting on toilet with NT present to assume care of patient.   Therapy Documentation Precautions:  Precautions Precautions: Fall Precaution Comments: please have patient wear her shoes for transfer/ambulation (left with lift for leg length discrepancy) Required Braces or Orthoses: Splint/Cast Splint/Cast: L wrist soft slplint Restrictions Weight Bearing Restrictions: Yes LUE Weight Bearing: Non weight bearing Other Position/Activity Restrictions: Assumed NWB on L wrist  Pain: Reports she has been experiencing a "burning" type pain along dorsal surface of forearm underneath where the splint is located.    Therapy/Group: Individual Therapy  Tawana Scale , PT, DPT, CSRS  06/03/2020, 12:24 PM

## 2020-06-03 NOTE — Plan of Care (Signed)
Problem: RH Balance Goal: LTG: Patient will maintain dynamic sitting balance (OT) Description: LTG:  Patient will maintain dynamic sitting balance with assistance during activities of daily living (OT) Flowsheets (Taken 06/03/2020 1227) LTG: Pt will maintain dynamic sitting balance during ADLs with: (goal downgraded 2/2 pt progressing slower than anticipated) Supervision/Verbal cueing Note: goal downgraded 2/2 pt progressing slower than anticipated Goal: LTG Patient will maintain dynamic standing with ADLs (OT) Description: LTG:  Patient will maintain dynamic standing balance with assist during activities of daily living (OT)  Flowsheets (Taken 06/03/2020 1227) LTG: Pt will maintain dynamic standing balance during ADLs with: (goal downgraded 2/2 pt progressing slower than anticipated) Supervision/Verbal cueing Note: goal downgraded 2/2 pt progressing slower than anticipated   Problem: Sit to Stand Goal: LTG:  Patient will perform sit to stand in prep for activites of daily living with assistance level (OT) Description: LTG:  Patient will perform sit to stand in prep for activites of daily living with assistance level (OT) Flowsheets (Taken 06/03/2020 1227) LTG: PT will perform sit to stand in prep for activites of daily living with assistance level: (goal downgraded 2/2 pt progressing slower than anticipated) Supervision/Verbal cueing Note: goal downgraded 2/2 pt progressing slower than anticipated   Problem: RH Grooming Goal: LTG Patient will perform grooming w/assist,cues/equip (OT) Description: LTG: Patient will perform grooming with assist, with/without cues using equipment (OT) Flowsheets (Taken 06/03/2020 1227) LTG: Pt will perform grooming with assistance level of: (goal downgraded 2/2 pt progressing slower than anticipated) Supervision/Verbal cueing Note: goal downgraded 2/2 pt progressing slower than anticipated   Problem: RH Bathing Goal: LTG Patient will bathe all body parts  with assist levels (OT) Description: LTG: Patient will bathe all body parts with assist levels (OT) Flowsheets (Taken 06/03/2020 1227) LTG: Pt will perform bathing with assistance level/cueing: (goal downgraded 2/2 pt progressing slower than anticipated) Supervision/Verbal cueing Note: goal downgraded 2/2 pt progressing slower than anticipated   Problem: RH Dressing Goal: LTG Patient will perform upper body dressing (OT) Description: LTG Patient will perform upper body dressing with assist, with/without cues (OT). Flowsheets (Taken 06/03/2020 1227) LTG: Pt will perform upper body dressing with assistance level of: (goal downgraded 2/2 pt progressing slower than anticipated) -- Note: goal downgraded 2/2 pt progressing slower than anticipated Goal: LTG Patient will perform lower body dressing w/assist (OT) Description: LTG: Patient will perform lower body dressing with assist, with/without cues in positioning using equipment (OT) Flowsheets (Taken 06/03/2020 1227) LTG: Pt will perform lower body dressing with assistance level of: (goal downgraded 2/2 pt progressing slower than anticipated) Supervision/Verbal cueing Note: goal downgraded 2/2 pt progressing slower than anticipated   Problem: RH Toileting Goal: LTG Patient will perform toileting task (3/3 steps) with assistance level (OT) Description: LTG: Patient will perform toileting task (3/3 steps) with assistance level (OT)  Flowsheets (Taken 06/03/2020 1227) LTG: Pt will perform toileting task (3/3 steps) with assistance level: (goal downgraded 2/2 pt progressing slower than anticipated) Supervision/Verbal cueing Note: goal downgraded 2/2 pt progressing slower than anticipated   Problem: RH Toilet Transfers Goal: LTG Patient will perform toilet transfers w/assist (OT) Description: LTG: Patient will perform toilet transfers with assist, with/without cues using equipment (OT) Flowsheets (Taken 06/03/2020 1227) LTG: Pt will perform toilet  transfers with assistance level of: (goal downgraded 2/2 pt progressing slower than anticipated) Supervision/Verbal cueing Note: goal downgraded 2/2 pt progressing slower than anticipated   Problem: RH Tub/Shower Transfers Goal: LTG Patient will perform tub/shower transfers w/assist (OT) Description: LTG: Patient will perform tub/shower transfers  with assist, with/without cues using equipment (OT) Flowsheets (Taken 06/03/2020 1227) LTG: Pt will perform tub/shower stall transfers with assistance level of: (goal downgraded 2/2 pt progressing slower than anticipated) Supervision/Verbal cueing   Problem: RH Furniture Transfers Goal: LTG Patient will perform furniture transfers w/assist (OT/PT) Description: LTG: Patient will perform furniture transfers  with assistance (OT/PT). Flowsheets (Taken 06/03/2020 1227) LTG: Pt will perform furniture transfers with assist:: (goal downgraded 2/2 pt progressing slower than anticipated) Supervision/Verbal cueing Note: goal downgraded 2/2 pt progressing slower than anticipated

## 2020-06-03 NOTE — Plan of Care (Signed)
  Problem: RH Balance Goal: LTG Patient will maintain dynamic standing balance (PT) Description: LTG:  Patient will maintain dynamic standing balance with assistance during mobility activities (PT) Flowsheets (Taken 06/03/2020 2126) LTG: Pt will maintain dynamic standing balance during mobility activities with:: (downgraded based on ELOS and pt progress) Supervision/Verbal cueing Note: downgraded based on ELOS and pt progress   Problem: RH Bed to Chair Transfers Goal: LTG Patient will perform bed/chair transfers w/assist (PT) Description: LTG: Patient will perform bed to chair transfers with assistance (PT). Flowsheets (Taken 06/03/2020 2126) LTG: Pt will perform Bed to Chair Transfers with assistance level: (downgraded based on ELOS and pt progress) Supervision/Verbal cueing Note: downgraded based on ELOS and pt progress   Problem: RH Car Transfers Goal: LTG Patient will perform car transfers with assist (PT) Description: LTG: Patient will perform car transfers with assistance (PT). Flowsheets (Taken 06/03/2020 2126) LTG: Pt will perform car transfers with assist:: (downgraded based on ELOS and pt progress) Supervision/Verbal cueing Note: downgraded based on ELOS and pt progress   Problem: RH Ambulation Goal: LTG Patient will ambulate in controlled environment (PT) Description: LTG: Patient will ambulate in a controlled environment, # of feet with assistance (PT). Flowsheets (Taken 06/03/2020 2126) LTG: Pt will ambulate in controlled environ  assist needed:: (downgraded based on ELOS and pt progress) Supervision/Verbal cueing LTG: Ambulation distance in controlled environment: 148ft Note: downgraded based on ELOS and pt progress Goal: LTG Patient will ambulate in home environment (PT) Description: LTG: Patient will ambulate in home environment, # of feet with assistance (PT). Flowsheets (Taken 06/03/2020 2126) LTG: Pt will ambulate in home environ  assist needed:: (downgraded based on  ELOS and pt progress) Supervision/Verbal cueing LTG: Ambulation distance in home environment: 50ft Note: downgraded based on ELOS and pt progress   Problem: RH Stairs Goal: LTG Patient will ambulate up and down stairs w/assist (PT) Description: LTG: Patient will ambulate up and down # of stairs with assistance (PT) Flowsheets (Taken 06/03/2020 2126) LTG: Pt will ambulate up/down stairs assist needed:: (downgraded based on ELOS and pt progress) Supervision/Verbal cueing LTG: Pt will  ambulate up and down number of stairs: 2 Note: downgraded based on ELOS and pt progress

## 2020-06-03 NOTE — Progress Notes (Signed)
Speech Language Pathology Daily Session Note  Patient Details  Name: SHAMECKA HOCUTT MRN: [id number] Date of Birth: 1940/09/07  Today's Date: 06/03/2020 SLP Individual Time: 0830-0927 SLP Individual Time Calculation (min): 57 min  Short Term Goals: Week 1: SLP Short Term Goal 1 (Week 1): Pt will complete complex ADLs (specifically medication and money management) with mod I.  Skilled Therapeutic Interventions: Pt was seen for skilled ST targeting cognitive goals. SLP facilitated session with 2 checkbook balancing tasks, ranging from semi-complex to complex, given that pt is responsible for checkbook balancing at home. Pt was mod I for problem solving throughout tasks, 2 verbal cues provided for awareness of small errors. Pt also demonstrated ability to self-detect and correct 2 additional errors. She reports she occasionally made small errors with her checkbook (mainly with rounding) prior to admission and feels her performance today is baseline. SLP also targeted anticipatory awareness by providing pt with Supervision level verbal cues to begin to think about activities she may need assistance with at home. Pt left sitting edge of bed with alarm set and needs within reach. Continue per current plan of care.          Pain Pain Assessment Pain Scale: (P) 0-10 Pain Score: (P) 0-No pain  Therapy/Group: Individual Therapy  Arbutus Leas 06/03/2020, 7:20 AM

## 2020-06-03 NOTE — IPOC Note (Signed)
Overall Plan of Care [facility]) Patient Details Name: [name] MRN: [id number] DOB: 10/22/1940  Admitting Diagnosis: Stroke (cerebrum) [name])  Hospital Problems: Principal Problem:   Stroke (cerebrum) [name]) Active Problems:   Triquetral fracture     Functional Problem List: Nursing Medication Management,Motor,Nutrition,Pain,Safety,Skin Integrity  PT Balance,Endurance,Safety,Sensory  OT Balance,Endurance,Motor  SLP Cognition  TR         Basic ADL's: OT Grooming,Bathing,Dressing,Toileting     Advanced  ADL's: OT Simple Meal Preparation,Light Housekeeping     Transfers: PT Bed to [employer]  OT Toilet,Tub/Shower     Locomotion: PT Ambulation,Stairs     Additional Impairments: OT Fuctional Use of Upper Extremity  SLP Social Cognition   Memory  TR      Anticipated Outcomes Item Anticipated Outcome  Self Feeding independent  Swallowing      Basic self-care  mod I/CS  Toileting  mod I   Bathroom Transfers mod I/CS  Bowel/Bladder  Patient will continue to remain continent of bowel & bladder  Transfers  mod i  Locomotion  mod i  Communication     Cognition  mod I  Pain  Pain level will steadily decrease to an acceptable level for functioning  Safety/Judgment  Patient will have no falls with injury while on CIR   Therapy Plan: PT Intensity: Minimum of 1-2 x/day ,45 to 90 minutes PT Frequency: 5 out of 7 days PT Duration Estimated Length of Stay: 5-7 days OT Intensity: Minimum of 1-2 x/day, 45 to 90 minutes OT Frequency: 5 out of 7 days OT Duration/Estimated Length of Stay: 5-7 days SLP Intensity: Minumum of 1-2 x/day, 30 to 90 minutes SLP Frequency: 3 to 5 out of 7 days SLP Duration/Estimated Length of Stay: 14 days, possibly shorter for SLP   Due to the current state of emergency, patients may not be receiving their 3-hours of Medicare-mandated therapy.   Team Interventions: Nursing Interventions Patient/Family Education,Disease  Management/Prevention,Pain Management,Medication Management,Skin Care/Wound Management,Psychosocial Support,Discharge Planning,Cognitive Remediation/Compensation  PT interventions Ambulation/gait training,DME/adaptive equipment instruction,Neuromuscular re-education,Stair training,UE/LE Strength taining/ROM,Balance/vestibular training,Discharge planning,Therapeutic Activities,Disease management/prevention,Functional mobility training,Patient/family education,Therapeutic Exercise  OT Interventions Balance/vestibular training,Neuromuscular re-education,Self Care/advanced ADL retraining,DME/adaptive equipment instruction,Patient/family education,UE/LE Coordination activities,Discharge planning,Functional mobility training,Therapeutic Activities  SLP Interventions Cognitive remediation/compensation,Cueing hierarchy,Patient/family education,Internal/external aids,Environmental controls,Functional tasks  TR Interventions    SW/CM Interventions Discharge Planning,Psychosocial Support,Patient/Family Education   Barriers to Discharge MD  Medical stability  Nursing      PT Weight bearing restrictions fails to adhere to NWB on LUE w/functional tasks, frequent reminders needed  OT      SLP      SW Decreased caregiver support     Team Discharge Planning: Destination: PT-Home ,OT- Home , SLP-Home Projected Follow-up: PT-Outpatient PT, OT-  Outpatient OT, SLP-Other (comment) (To be determined pending progress made while inpatient) Projected Equipment Needs: PT-To be determined, OT- None recommended by OT, SLP-None recommended by SLP Equipment Details: PT-may need platform RW, will continue to assess depending on progress, OT-patient owns shower seat Patient/family involved in discharge planning: PT- Patient,Family member/caregiver,  OT-Patient,Family member/caregiver, SLP-Patient  MD ELOS: 7-10 days  Medical Rehab Prognosis:  Excellent Assessment: [name] is an 79 year old woman who is admitted to  CIR with mild L sided weakness and decreased safety awarenesssecondary to 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 She is maintained on Lovenox for DVT prophylaxis. Her pain is being managed with Ultram and Flexeril prn, as well ans lidoderm patches and kpads. She maintains on NWB precautions for her left wrist triquetral  avlusion fracture- repeat XR stable.     See Team Conference Notes for weekly updates to the plan of care

## 2020-06-03 NOTE — Progress Notes (Signed)
Occupational Therapy Session Note  Patient Details  Name: [name] MRN: [id number] Date of Birth: [birth year]  Today's Date: 06/03/2020 OT Individual Time: 6438-3779 OT Individual Time Calculation (min): 49 min    Short Term Goals: Week 1:  OT Short Term Goal 1 (Week 1): STG = LTG due to anticipated LOS  Skilled Therapeutic Interventions/Progress Updates:    Pt received sitting EOB, requesting to bathe and use restroom. Denies pain.  Session focus on toileting, bathing EOB, dressing, and DME recs. Donned B shoes with close sup. Sit > stand with platform walker with CGA + min VCs to push up from bed with RUE. Amb to toilet with platform walker + close sup 2/2 decreased sensation in LLE. Toilet transfer with platform walker + close sup. Voided successfully. Clothing management + anterior peri care with close sup + use of R grab bar. Amb back to EOB with platform walker + close sup.  Doffed shirt with close supervision. Doffed underwear + pants with CGA 2/2 forward trunk flexion in standing. Bathed UB + applied deodorant with close sup. Bathed LB with CGA for sit<>stand when bathing peri area. Donned socks + B shoes with close supervision. Need min VCs throughout session to not WB through L wrist. Discussed DME recs re 3in1. Pt states she has a shower chair that fits her walk-in already, but will need a new 3in1. Pt amb to chair with platform walker + close sup. Downgraded LTG this date 2/2 decreased balance and safe use of platform walker + following NWB precautions. Pt left in chair with chair alarm engaged, call bell in reach, all needs met.   Therapy Documentation Precautions:  Precautions Precautions: Fall Precaution Comments: please have patient wear her shoes for transfer/ambulation (left with lift for leg length discrepancy) Required Braces or Orthoses: Splint/Cast Splint/Cast: L wrist soft slplint Restrictions Weight Bearing Restrictions: Yes LUE Weight Bearing: Non weight  bearing Other Position/Activity Restrictions: Assumed NWB on L wrist Pain: Pain Assessment Pain Scale: 0-10 Pain Score: 0-No pain ADL: See Care Tool for more details.   Therapy/Group: Individual Therapy  [name], MS, OTR/L Clyda Greener OTR/L  06/03/2020, 12:07 PM

## 2020-06-03 NOTE — Progress Notes (Signed)
[place] PHYSICAL MEDICINE & REHABILITATION PROGRESS NOTE   Subjective/Complaints: Complains of some sore throat- received benadryl yesterday which helped. Offered lozenges and she is agreeable. WBC stable. Discussed with Gregary Signs OT that patient is progressing very well. She asks if WB status has potential to be downgraded as she is not having pain.   ROS: +sore throat  Objective:   DG Wrist Complete Left  Result Date: 06/02/2020 CLINICAL DATA:  Left wrist fracture. EXAM: LEFT WRIST - COMPLETE 3+ VIEW COMPARISON:  Radiographs 05/26/2020. FINDINGS: The wrist has been splinted, obscuring bone detail. Small ossific density dorsal to the carpus on the lateral view is unchanged. This could reflect a small avulsion fracture or sequela of remote trauma. No other evidence of acute fracture or dislocation. There are moderately advanced degenerative changes at the scaphotrapeziotrapezoidal and 1st carpometacarpal articulations. IMPRESSION: Stable small ossific density dorsal to the carpus may reflect a small avulsion fracture or sequela of remote trauma. No other acute findings. Electronically Signed   By: Richardean Sale M.D.   On: 06/02/2020 16:01   Recent Labs    06/01/20 0517 06/02/20 0607  WBC 5.8 6.8  HGB 12.1 12.6  HCT 38.6 39.0  PLT 319 332   Recent Labs    06/01/20 0517  NA 139  K 4.0  CL 102  CO2 26  GLUCOSE 106*  BUN 18  CREATININE 0.87  CALCIUM 8.9    Intake/Output Summary (Last 24 hours) at 06/03/2020 1510 Last data filed at 06/03/2020 4650 Gross per 24 hour  Intake 700 ml  Output --  Net 700 ml        Physical Exam: Vital Signs Blood pressure (!) 159/66, pulse 79, temperature (P) 97.7 F (36.5 C), temperature source (P) Oral, resp. rate 18, height 5\' 4"  (1.626 m), weight 69.2 kg, SpO2 98 %. Gen: no distress, normal appearing HEENT: oral mucosa pink and moist, NCAT Cardio: Reg rate Chest: normal effort, normal rate of breathing Abd: soft,  non-distended Ext: no edema Skin: intact Musculoskeletal:     Comments: RUE- biceps, triceps, WE, 4+/5 and grip and finger abd 5-/5 LUE- biceps, triceps 4+/5, WE- NT due ot fx, grip and finger abd 5-./5 RLE- HF, KE, KF ,DF and PF 5/5 LLE- 5-/5 in same muscles B/L   Skin:    General: Skin is warm and dry.     Comments: L wrist in splint wrapped with ACE wrap- fingers moving and hand warm   Neurological:     Mental Status: She is alert and oriented to person, place, and time.     Comments: Light touch decreased from L knee down to foot Intact on R side- LUE- intact to light touch as well Slowed processing- needed daughter to fill in info for her.   Psychiatric:        Mood and Affect: Mood normal.        Behavior: Behavior normal.   Assessment/Plan: 1. Functional deficits which require 3+ hours per day of interdisciplinary therapy in a comprehensive inpatient rehab setting.  Physiatrist is providing close team supervision and 24 hour management of active medical problems listed below.  Physiatrist and rehab team continue to assess barriers to discharge/monitor patient progress toward functional and medical goals  Care Tool:  Bathing    Body parts bathed by patient: Right arm,Left arm,Chest,Abdomen,Front perineal area,Buttocks,Face,Left lower leg,Right lower leg,Left upper leg,Right upper leg         Bathing assist Assist Level: Contact Guard/Touching assist  Upper Body Dressing/Undressing Upper body dressing   What is the patient wearing?: Pull over shirt    Upper body assist Assist Level: Supervision/Verbal cueing    Lower Body Dressing/Undressing Lower body dressing      What is the patient wearing?: Underwear/pull up,Pants     Lower body assist Assist for lower body dressing: Supervision/Verbal cueing     Toileting Toileting    Toileting assist Assist for toileting: Supervision/Verbal cueing     Transfers Chair/bed transfer  Transfers assist      Chair/bed transfer assist level: Supervision/Verbal cueing     Locomotion Ambulation   Ambulation assist      Assist level: Supervision/Verbal cueing Assistive device: Walker-platform Max distance: 50   Walk 10 feet activity   Assist     Assist level: Minimal Assistance - Patient > 75% Assistive device: No Device   Walk 50 feet activity   Assist    Assist level: Minimal Assistance - Patient > 75% Assistive device: No Device    Walk 150 feet activity   Assist Walk 150 feet activity did not occur: Safety/medical concerns         Walk 10 feet on uneven surface  activity   Assist     Assist level: Moderate Assistance - Patient - 50 - 74% Assistive device: Other (comment) (NO DEVICE, MIN ASSIST W/PFRW)   Wheelchair     Assist Will patient use wheelchair at discharge?: No             Wheelchair 50 feet with 2 turns activity    Assist            Wheelchair 150 feet activity     Assist          Blood pressure (!) 159/66, pulse 79, temperature (P) 97.7 F (36.5 C), temperature source (P) Oral, resp. rate 18, height 5\' 4"  (1.626 m), weight 69.2 kg, SpO2 98 %.   Medical Problem List and Plan: 1.  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  Antithrombotics: -DVT/anticoagulation:  Pharmaceutical: Lovenox             -antiplatelet therapy: DAPT 3. Pain Management: ultram and flexeril prn- added lidoderm patches to neck 9pm to 9am and kpad for heat. continue current regimen.  4. Mood:  LCSW to follow for evaluation and support.              -antipsychotic agents: N/A 5. Neuropsych: This patient is capable of making decisions on her own behalf. 6. Skin/Wound Care: Routine pressure relief measures.  7.  Fluids/Electrolytes/Nutrition: Monitor I/O.  Electrolytes are stable- continue to monitor weekly.  8. Dyslipidemia: Continue Lipitor.  9. L wrist- triquetral avulsion fx- NWB on LUE- can use platform walker. Ordered XR 12/20 to assess healing and appears stable. Discussed that typical NWB status is at least 2 weeks and until cleared by ortho.  10. Constipation- having regular BM- continue 3 prunes daily 11. 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  12. HTN: BP has been labile: continue to monitor   LOS: 3 days A FACE TO FACE EVALUATION WAS PERFORMED  Lavanna Rog P Alsha Meland 06/03/2020, 3:10 PM

## 2020-06-04 ENCOUNTER — Inpatient Hospital Stay (HOSPITAL_COMMUNITY): Payer: PPO

## 2020-06-04 ENCOUNTER — Inpatient Hospital Stay (HOSPITAL_COMMUNITY): Payer: PPO | Admitting: Speech Pathology

## 2020-06-04 DIAGNOSIS — S62112D Displaced fracture of triquetrum [cuneiform] bone, left wrist, subsequent encounter for fracture with routine healing: Secondary | ICD-10-CM

## 2020-06-04 DIAGNOSIS — I6300 Cerebral infarction due to thrombosis of unspecified precerebral artery: Secondary | ICD-10-CM

## 2020-06-04 NOTE — Progress Notes (Signed)
Occupational Therapy Session Note  Patient Details  Name: [name] MRN: [id number] Date of Birth: 1940/07/23  Today's Date: 06/04/2020 OT Individual Time: 8891-6945 OT Individual Time Calculation (min): 59 min    Short Term Goals: Week 1:  OT Short Term Goal 1 (Week 1): STG = LTG due to anticipated LOS  Skilled Therapeutic Interventions/Progress Updates:    Pt received up in chair, chair alarm engaged, agreeable to therapy. Denies pain this session. Re-screened pt vision, RUE strength and ROM, and B FMC in prep for DC later this week. Pt demonstrating mild FMC deficits with R digit coordination, as her performance was significantly slower to complete thumb-digit opposition when compared to the L. Pt is R hand dominant. Sit <> stand with close sup for balance. In prep for improved house management performance, pt organized and wiped down side tray for ~7 min. Denied fatigued. Amb down to therapy gym with [name] with CGA for balance + min VCs to strap LUE to platform. Seated at table, targeted R [facility] for improved ADL performance. Pt completed 9 Hole Peg test with B hands, results as followed:    R hand: 32.01 secs L hand: 36.53 secs  Pt then completed mass practice of flipping cards over, and digit to palm translation with R hand. Amb to mat with PFRW + close sup. In standing, retrieved cards from L knee height and placed above head height on pt's R to improve dynamic standing balance + activity tolerance in prep for house management. Req CGA for balance, denies significant fatigue. Discussed pt kitchen + bedroom set-up, pt states that the [place] should be able to fit all spaces. Additionally discussed how to move ADL items around household with PFRW, will trial in therapy kitchen next session. Amb back to room with PFRW + CGA. Left in chair with chair alarm engaged, call bell in reach, all needs met.   Therapy Documentation Precautions:  Precautions Precautions: Fall Precaution Comments:  please have patient wear her shoes for transfer/ambulation (left with lift for leg length discrepancy) Required Braces or Orthoses: Splint/Cast Splint/Cast: L wrist soft slplint Restrictions Weight Bearing Restrictions: Yes LUE Weight Bearing: Weight bear through elbow only Other Position/Activity Restrictions: Assumed NWB on L wrist Pain: denies pain this session   ADL: See Care Tool for more details.  Therapy/Group: Individual Therapy  [name], MS, OTR/L 06/04/2020, 12:25 PM

## 2020-06-04 NOTE — Progress Notes (Signed)
Speech Language Pathology Discharge Summary  Patient Details  Name: [name] MRN: [id number] Date of Birth: 29-Jul-1940  Today's Date: 06/05/2020 SLP Individual Time: 1100-1156 SLP Individual Time Calculation (min): 56 min   Skilled Therapeutic Interventions:  Pt was seen for skilled ST targeting cognitive skills. SLP facilitated session with administration of MOCA version 7.1 as means of post-test for this admission. Pt scored 26/30 (WNL=26 or >). Pt did take slight degree of extra time to complete tasks, and struggled the most with executive function and calculations subtests. Discussed compensatory strategies. During a semi-complex time management task, she was Mod I for error and anticipatory awareness, as well as for problem solving. She also demonstrated ability to verbally problem solve and anticipate a new way/schedule for daily hygiene when she is on a time schedule (ex: a day when she has doctors' appointments) Mod I. Pt left sitting in recliner with alarm set and needs within reach. Continue per current plan of care.    Patient has met 1 of 1 long term goals.  Patient to discharge at overall Modified Independent level.  Reasons goals not met: n/a   Clinical Impression/Discharge Summary:   Pt made quick functional gains and met 1 out of 1 long term goals this admission. Pt is currently Mod I for functional familiar complex problem solving and emergent awareness. She has also demonstrated excellent safety and anticipatory awareness in conversations with SLP regarding discharge and current level of functioning. Based on her physical level of assistance, pt will still required 24/7 supervision at home for safe mobility and ADLs, but from an ST standpoint, would only intermittently require assistance with complex cognitive tasks based on Mod I level of functioning in treatment sessions. Pt and family education is complete at this time, and no follow up ST is indicated.    Care Partner:   Caregiver Able to Provide Assistance: Yes  Type of Caregiver Assistance: Cognitive;Physical  Recommendation:  None  Rationale for SLP Follow Up:  (n/a)   Equipment: none   Reasons for discharge: Discharged from hospital   Patient/Family Agrees with Progress Made and Goals Achieved: Yes    Arbutus Leas 06/05/2020, 7:25 AM

## 2020-06-04 NOTE — Progress Notes (Signed)
Speech Language Pathology Daily Session Note  Patient Details  Name: [name] MRN: [id number] Date of Birth: 12/06/1940  Today's Date: 06/04/2020 SLP Individual Time: 1470-9295 SLP Individual Time Calculation (min): 56 min  Short Term Goals: Week 1: SLP Short Term Goal 1 (Week 1): Pt will complete complex ADLs (specifically medication and money management) with mod I.  Skilled Therapeutic Interventions: Pt was seen for skilled ST targeting cognitive goals. SLP facilitated session with a complex scheduling and calendar based task, during which pt was Mod I for problem solving and 1 verbal cue provided for awareness of error. Pt also able to anticipate and recall and equipment needs for safe d/c. Pt left laying in bed with alarm set and needs within reach. Continue per current plan of care.      Pain Pain Assessment Pain Scale: Faces Faces Pain Scale: No hurt  Therapy/Group: Individual Therapy  Arbutus Leas 06/04/2020, 7:22 AM

## 2020-06-04 NOTE — Progress Notes (Signed)
Physical Therapy Session Note  Patient Details  Name: [name] MRN: [id number] Date of Birth: [birth year]  Today's Date: 06/04/2020 PT Individual Time: 0800-0912 PT Individual Time Calculation (min): 72 min   Short Term Goals: Week 1:  PT Short Term Goal 1 (Week 1): STGS=LTGS  Skilled Therapeutic Interventions/Progress Updates:    Patient received sitting edge of bed, agreeable to PT. She reports "discomfort" in L thumb, but denied pain interventions. Patient able to ambulate from room to therapy gym with [place] and supervision. Decreased gait speed and forward flexed posture noted, but no other significant gait deviations noted. Patient completing Berg Balance Scale and scored a 45/56, indicating increased risk for falling and benefit from use of AD. Discussed findings with patient and she verbalized understanding. Patient completing TUG with LPRW and supervision in 25.5s, which is increased time as related to age-norm indicating increased risk for falling. Discussed findings with patient and she verbalized understanding. Patient able to complete BITS task with letter and word sequences while standing with LPRW and distant supervision. Patient able to complete task with 85% accuracy with no additional cuing needed. Patient ambulating back to room with LPRW and supervision returning to recliner. Chair alarm on, call light within reach.   Therapy Documentation Precautions:  Precautions Precautions: Fall Precaution Comments: please have patient wear her shoes for transfer/ambulation (left with lift for leg length discrepancy) Required Braces or Orthoses: Splint/Cast Splint/Cast: L wrist soft slplint Restrictions Weight Bearing Restrictions: Yes LUE Weight Bearing: Weight bear through elbow only Other Position/Activity Restrictions: Assumed NWB on L wrist    Therapy/Group: Individual Therapy  Karoline Caldwell, PT, DPT, CBIS  06/04/2020, 7:43 AM

## 2020-06-04 NOTE — Patient Care Conference (Signed)
Inpatient RehabilitationTeam Conference and Plan of Care Update Date: 06/04/2020   Time: 11:05 AM    Patient Name: [name]      Medical Record Number: [id number]  Date of Birth: 09/20/1940 Sex: Female         Room/Bed: 4M09C/4M09C-01 Payor Info: Payor: [employer] / Plan: [employer] Must PPO / Product Type: *No Product type* /    Admit Date/Time:  05/31/2020  1:20 PM  Primary Diagnosis:  Stroke (cerebrum) [facility])  Hospital Problems: Principal Problem:   Stroke (cerebrum) [facility]) Active Problems:   Triquetral fracture    Expected Discharge Date: Expected Discharge Date: 06/06/20  Team Members Present: Physician leading conference: Dr. Alysia Penna Care Coodinator Present: Erlene Quan, BSW;Domingo Fuson Hervey Ard, RN, BSN, [place] Nurse Present: Judee Clara, LPN PT Present: Estevan Ryder, PT OT Present: Clyda Greener, OT SLP Present: Jettie Booze, CF-SLP PPS Coordinator present : Gunnar Fusi, Novella Olive, PT     Current Status/Progress Goal Weekly Team Focus  Bowel/Bladder   Cotinent of B/B/, LBM 06/03/20  Maintain continent and regularity  QS/PRN assessment   Swallow/Nutrition/ Hydration             ADL's   sup to CGA for toilet transfers + func mobility, close super for UB bathing/dressing EOB, CGA for LB bathing/dressing, sup for seated grooming/ADLs  supervision  ADL retraining, transfer training, precaution education, DME recs, pt/family edu, NMR, safe platform walker use   Mobility   supervision bed mobility, supervision sit<>stand and stand pivot transfers using L PFRW, CGA gait up to 273ft using L PFRW, CGA 2 steps uisng L PFRW  downgraded to supervision overall at ambulatory level based on ELOS and pt progress  transfer training, dynamic standing balance, gait training with LRAD, stair navigation training, pt/family education, discharge planning, activity tolerance   Communication             Safety/Cognition/ Behavioral Observations   Supervision-Mod I  Mod I  complex problem solving, emergent awareness, education   Pain             Skin               Discharge Planning:  Discharging home on Friday.   Team Discussion: Right fronto-parietal CVA ; medically stable. Some issues with pain, continent of bowel and bladder. Concerns about platform walker and NWB status discussed.  Patient on target to meet rehab goals: yes, currently supervision for ADLs and toilet transfers. Supervision for sit to stand and able to ambulate up to 200' with platform walker.  *See Care Plan and progress notes for long and short-term goals.   Revisions to Treatment Plan:  Downgraded goals due to timing for discharge; goals set for Mod I Teaching Needs: Transfers, toileting, medications, etc.  Current Barriers to Discharge: Decreased caregiver support, Home enviroment access/layout and Weight bearing restrictions  Possible Resolutions to Barriers: Husband purchased half step for home entrance Outpatient follow up recommended DME ordered     Medical Summary Current Status: BP lability, Left wrist pain due to fracture  Barriers to Discharge: Other (comments)  Barriers to Discharge Comments: Left wrist fracture Possible Resolutions to Barriers/Weekly Focus: f/u ortho   Continued Need for Acute Rehabilitation Level of Care: The patient requires daily medical management by a physician with specialized training in physical medicine and rehabilitation for the following reasons: Direction of a multidisciplinary physical rehabilitation program to maximize functional independence : Yes Medical management of patient stability for increased activity during participation in an intensive  rehabilitation regime.: Yes Analysis of laboratory values and/or radiology reports with any subsequent need for medication adjustment and/or medical intervention. : Yes   I attest that I was present, lead the team conference, and concur with the assessment and  plan of the team.   Chana Bode B 06/04/2020, 1:52 PM

## 2020-06-04 NOTE — Progress Notes (Addendum)
[place] PHYSICAL MEDICINE & REHABILITATION PROGRESS NOTE   Subjective/Complaints:  Pt without new issues  ROS: no cp SOB, N/V/D  Objective:   DG Wrist Complete Left  Result Date: 06/02/2020 CLINICAL DATA:  Left wrist fracture. EXAM: LEFT WRIST - COMPLETE 3+ VIEW COMPARISON:  Radiographs 05/26/2020. FINDINGS: The wrist has been splinted, obscuring bone detail. Small ossific density dorsal to the carpus on the lateral view is unchanged. This could reflect a small avulsion fracture or sequela of remote trauma. No other evidence of acute fracture or dislocation. There are moderately advanced degenerative changes at the scaphotrapeziotrapezoidal and 1st carpometacarpal articulations. IMPRESSION: Stable small ossific density dorsal to the carpus may reflect a small avulsion fracture or sequela of remote trauma. No other acute findings. Electronically Signed   By: Richardean Sale M.D.   On: 06/02/2020 16:01   Recent Labs    06/02/20 0607  WBC 6.8  HGB 12.6  HCT 39.0  PLT 332   No results for input(s): NA, K, CL, CO2, GLUCOSE, BUN, CREATININE, CALCIUM in the last 72 hours.  Intake/Output Summary (Last 24 hours) at 06/04/2020 0738 Last data filed at 06/03/2020 0823 Gross per 24 hour  Intake 340 ml  Output --  Net 340 ml        Physical Exam: Vital Signs Blood pressure (!) 145/67, pulse 68, temperature 97.7 F (36.5 C), resp. rate 18, height '5\' 4"'  (1.626 m), weight 69.2 kg, SpO2 95 %. Gen: no distress, normal appearing HEENT: oral mucosa pink and moist, NCAT Cardio: Reg rate Chest: normal effort, normal rate of breathing Abd: soft, non-distended Ext: no edema Skin: intact Musculoskeletal:     Comments: RUE- biceps, triceps, WE, 4+/5 and grip and finger abd 5-/5 LUE- biceps, triceps 4+/5, WE- NT due ot fx, grip and finger abd 5-./5 RLE- HF, KE, KF ,DF and PF 5/5 LLE- 5-/5 in same muscles B/L   Skin:    General: Skin is warm and dry.     Comments: L wrist in splint  wrapped with ACE wrap- fingers moving and hand warm   Neurological:     Mental Status: She is alert and oriented to person, place, and time.     Comments: Light touch decreased from L knee down to foot Intact on R side- LUE- intact to light touch as well Slowed processing- needed daughter to fill in info for her.   Psychiatric:        Mood and Affect: Mood normal.        Behavior: Behavior normal.   Assessment/Plan: 1. Functional deficits which require 3+ hours per day of interdisciplinary therapy in a comprehensive inpatient rehab setting.  Physiatrist is providing close team supervision and 24 hour management of active medical problems listed below.  Physiatrist and rehab team continue to assess barriers to discharge/monitor patient progress toward functional and medical goals  Care Tool:  Bathing    Body parts bathed by patient: Right arm,Left arm,Chest,Abdomen,Front perineal area,Buttocks,Face,Left lower leg,Right lower leg,Left upper leg,Right upper leg         Bathing assist Assist Level: Contact Guard/Touching assist     Upper Body Dressing/Undressing Upper body dressing   What is the patient wearing?: Pull over shirt    Upper body assist Assist Level: Supervision/Verbal cueing    Lower Body Dressing/Undressing Lower body dressing      What is the patient wearing?: Underwear/pull up,Pants     Lower body assist Assist for lower body dressing: Supervision/Verbal cueing  Toileting Toileting    Toileting assist Assist for toileting: Supervision/Verbal cueing     Transfers Chair/bed transfer  Transfers assist     Chair/bed transfer assist level: Supervision/Verbal cueing Chair/bed transfer assistive device: Other,Walker (L PFRW)   Locomotion Ambulation   Ambulation assist      Assist level: Contact Guard/Touching assist Assistive device: Walker-platform Max distance: 173f   Walk 10 feet activity   Assist     Assist level:  Supervision/Verbal cueing Assistive device: Walker-platform   Walk 50 feet activity   Assist    Assist level: Contact Guard/Touching assist Assistive device: Walker-platform    Walk 150 feet activity   Assist Walk 150 feet activity did not occur: Safety/medical concerns  Assist level: Contact Guard/Touching assist Assistive device: Walker-platform    Walk 10 feet on uneven surface  activity   Assist     Assist level: Contact Guard/Touching assist Assistive device: Walker-platform   Wheelchair     Assist Will patient use wheelchair at discharge?: No             Wheelchair 50 feet with 2 turns activity    Assist            Wheelchair 150 feet activity     Assist          Blood pressure (!) 145/67, pulse 68, temperature 97.7 F (36.5 C), resp. rate 18, height '5\' 4"'  (1.626 m), weight 69.2 kg, SpO2 95 %.   Medical Problem List and Plan: 1.  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 Formulized individual treatment plan based on medical history, underlying problem and comorbidities.  2.  Antithrombotics: -DVT/anticoagulation:  Pharmaceutical: Lovenox             -antiplatelet therapy: DAPT 3. Pain Management: ultram and flexeril prn- added lidoderm patches to neck 9pm to 9am and kpad for heat. continue current regimen.  4. Mood:  LCSW to follow for evaluation and support.              -antipsychotic agents: N/A 5. Neuropsych: This patient is capable of making decisions on her own behalf. 6. Skin/Wound Care: Routine pressure relief measures.  7. Fluids/Electrolytes/Nutrition: Monitor  I/O.  Electrolytes are stable- continue to monitor weekly.  8. Dyslipidemia: Continue Lipitor.  9. L wrist- triquetral avulsion fx- NWB on LUE- can use platform walker. Ordered XR 12/20 to assess healing and appears stable. Discussed that typical NWB status is at least 2 weeks and until cleared by ortho.  10. Constipation- having regular BM- continue 3 prunes daily- Last BM 12/21 11. 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  12. HTN: BP has been labile: continue to monitor Vitals:   06/03/20 1925 06/04/20 0517  BP: 116/60 (!) 145/67  Pulse: 71 68  Resp: 16 18  Temp: 97.6 F (36.4 C) 97.7 F (36.5 C)  SpO2: 98% 95%  Controlled   LOS: 4 days A FACE TO FACE EVALUATION WAS PERFORMED  ACharlett Blake12/22/2021, 7:38 AM

## 2020-06-04 NOTE — Progress Notes (Signed)
Patient ID: [name], female   DOB: Dec 03, 1940, 79 y.o.   MRN: [id number] Team Conference Report to Patient/Family  Team Conference discussion was reviewed with the patient and caregiver, including goals, any changes in plan of care and target discharge date.  Patient and caregiver express understanding and are in agreement.  The patient has a target discharge date of 06/06/20.  Dyanne Iha 06/04/2020, 1:19 PM

## 2020-06-04 NOTE — Progress Notes (Signed)
Patient ID: [name], female   DOB: 1941/04/16, 79 y.o.   MRN: [id number]   DME ordered through [facility]:  [place], [place], [facility] [name], [place]

## 2020-06-05 ENCOUNTER — Inpatient Hospital Stay (HOSPITAL_COMMUNITY): Payer: PPO

## 2020-06-05 ENCOUNTER — Inpatient Hospital Stay (HOSPITAL_COMMUNITY): Payer: PPO | Admitting: Physical Therapy

## 2020-06-05 ENCOUNTER — Inpatient Hospital Stay (HOSPITAL_COMMUNITY): Payer: PPO | Admitting: Speech Pathology

## 2020-06-05 MED ORDER — CYCLOBENZAPRINE HCL 5 MG PO TABS
5.0000 mg | ORAL_TABLET | Freq: Three times a day (TID) | ORAL | 0 refills | Status: DC | PRN
Start: 2020-06-05 — End: 2020-09-30

## 2020-06-05 MED ORDER — CLOPIDOGREL BISULFATE 75 MG PO TABS: 75.0000 mg | ORAL_TABLET | Freq: Every day | ORAL | 0 refills | Status: AC

## 2020-06-05 MED ORDER — PANTOPRAZOLE SODIUM 40 MG PO TBEC: 40.0000 mg | DELAYED_RELEASE_TABLET | Freq: Every day | ORAL | 0 refills | Status: DC

## 2020-06-05 MED ORDER — POLYETHYLENE GLYCOL 3350 17 G PO PACK: 17.0000 g | PACK | Freq: Every day | ORAL | 0 refills | Status: AC

## 2020-06-05 MED ORDER — ATORVASTATIN CALCIUM 40 MG PO TABS: 40.0000 mg | ORAL_TABLET | Freq: Every day | ORAL | 0 refills | Status: DC

## 2020-06-05 NOTE — Progress Notes (Signed)
Physical Therapy Discharge Summary  Patient Details  Name: [name] MRN: [id number] Date of Birth: [birth year]  Today's Date: 06/05/2020 PT Individual Time: 1335-1450 PT Individual Time Calculation (min): 75 min    Patient has met 8 of 8 long term goals due to improved activity tolerance, improved balance, improved postural control, increased strength, ability to compensate for deficits, functional use of  left upper extremity and left lower extremity, improved attention and improved awareness.  Patient to discharge at an ambulatory level using L platform RW requiring Supervision.  Patient's care partner is independent to provide the necessary physical and cognitive assistance at discharge.  All goals met.  Recommendation:  Patient will benefit from ongoing skilled PT services in outpatient setting to continue to advance safe functional mobility, address ongoing impairments in dynamic standing balance, L LE NMR and strength, dynamic gait training with LRAD, and minimize fall risk.  Equipment: RW with L platform  Reasons for discharge: treatment goals met and discharge from hospital  Patient/family agrees with progress made and goals achieved: Yes  Skilled Therapeutic Interventions/Progress Updates:  Pt received sitting in recliner and agreeable to therapy session. Pt's RW with L platform had been delivered to room - therapist adjusted height and set-up platform. Sit<>stands using PFRW mod-I during session. Gait in room using [place] with supervision. Sit<>supine on bed without using features independently. Gait training >182f around CIR using L PFRW with supervision and cuing for increased upright trunk posture as pt starts leaning forward onto AD. Simulated car transfer (SUV height) using L PFRW with supervision demoing proper recall of technique from prior session. Gait training ~174fup/down ramp using L PFRW with supervision and pt demoing improved AD management. Discussed home entry  with pt reporting the 1/2 step her daughter ordered is only 19inches wide and 15inches deep - pt's RW is 18inches wide on the inside and 22 inches wide to the outside of the wheels and 18inches deep meaning it will not fit on her 1/2 step fully - educated pt on how to perform her 1 step-up (8" height) without the half step and pt demonstrated understanding and ability to perform with close supervision for safety - correct recall of AD management safely without cuing. Ascended/descended 12 steps using R UE support on HRs with step-to pattern and close supervision for safety. Performed the following exercises with pt education on safe set-up and proper technique to perform as HEP. Gait training back to room and pt requesting to remain sitting up in recliner. Left with needs in reach and chair alarm on.  Access Code: [email or web address]: [email or web address] Date: 06/05/2020 Prepared by: CaPage SpiroExercises Supine Bridge - 1 x daily - 7 x weekly - 2 sets - 15 reps Sit to Stand with Arms Crossed - 1 x daily - 7 x weekly - 2 sets - 10 reps Step Taps on High Step - 1 x daily - 7 x weekly - 2 sets - 20 reps Standing Hip Abduction with Counter Support - 1 x daily - 7 x weekly - 2 sets - 15 reps  PT Discharge Precautions/Restrictions Precautions Precautions: Fall Precaution Comments: please have patient wear her shoes for transfer/ambulation (left with lift for leg length discrepancy) Required Braces or Orthoses: Splint/Cast Splint/Cast: L wrist soft slplint Restrictions Weight Bearing Restrictions: Yes LUE Weight Bearing: Non weight bearing Other Position/Activity Restrictions: Assumed NWB on L wrist Pain Pain Assessment Pain Scale: 0-10 Pain Score: 0-No pain Faces Pain Scale: No hurt Perception  Perception Perception:  Within Functional Limits Praxis Praxis: Intact  Cognition Overall Cognitive Status: Within Functional Limits for tasks assessed Arousal/Alertness:  Awake/alert Orientation Level: Oriented X4 Attention: Focused;Sustained Focused Attention: Appears intact Sustained Attention: Appears intact Memory: Appears intact Awareness: Appears intact Safety/Judgment: Appears intact Sensation  Sensation Light Touch: Impaired Detail Light Touch Impaired Details: Impaired LLE (decreased L LE compared to R) Hot/Cold: Not tested Proprioception: Appears Intact Stereognosis: Not tested Coordination Gross Motor Movements are Fluid and Coordinated: Yes Motor  Motor Motor: Hemiplegia Motor - Discharge Observations: mild LLE weakness  Mobility Bed Mobility Bed Mobility: Sit to Supine;Supine to Sit Supine to Sit: Independent Sit to Supine: Independent Transfers Transfers: Sit to Stand;Stand to [employer] Transfers Sit to Stand: Independent with assistive device Stand to Sit: Independent with assistive device Stand Pivot Transfers: Supervision/Verbal cueing Stand Pivot Transfer Details: Verbal cues for safe use of DME/AE Transfer (Assistive device): Left platform walker Locomotion  Gait Ambulation: Yes Gait Assistance: Supervision/Verbal cueing Gait Distance (Feet): 150 Feet Assistive device: Left platform walker Gait Assistance Details: Tactile cues for posture;Verbal cues for safe use of DME/AE Gait Gait: Yes Gait Pattern: Within Functional Limits Stairs / Additional Locomotion Stairs: Yes Stairs Assistance: Supervision/Verbal cueing Stair Management Technique: Two rails Number of Stairs: 12 Height of Stairs: 6 Ramp: Supervision/Verbal cueing Curb: Supervision/Verbal cueing Wheelchair Mobility Wheelchair Mobility: No  Trunk/Postural Assessment  Cervical Assessment Cervical Assessment: Exceptions to [facility] (forward head) Thoracic Assessment Thoracic Assessment: Exceptions to [facility] (hx of scoliosis and increased kyphosis) Lumbar Assessment Lumbar Assessment: Within Functional Limits Postural Control Postural Control: Within  Functional Limits  Balance  Balance Balance Assessed: Yes Standardized Balance Assessment Standardized Balance Assessment: Berg Balance Test;Timed Up and Go Test Merrilee Jansky & TUG performed on 06/04/20) Berg Balance Test Sit to Stand: Able to stand without using hands and stabilize independently Standing Unsupported: Able to stand safely 2 minutes Sitting with Back Unsupported but Feet Supported on Floor or Stool: Able to sit safely and securely 2 minutes Stand to Sit: Sits safely with minimal use of hands Transfers: Able to transfer safely, minor use of hands Standing Unsupported with Eyes Closed: Able to stand 10 seconds with supervision Standing Ubsupported with Feet Together: Able to place feet together independently and stand for 1 minute with supervision From Standing, Reach Forward with Outstretched Arm: Can reach forward >12 cm safely (5") From Standing Position, Pick up Object from Floor: Able to pick up shoe, needs supervision From Standing Position, Turn to Look Behind Over each Shoulder: Looks behind from both sides and weight shifts well Turn 360 Degrees: Able to turn 360 degrees safely but slowly Standing Unsupported, Alternately Place Feet on Step/Stool: Able to complete >2 steps/needs minimal assist Standing Unsupported, One Foot in Front: Able to plae foot ahead of the other independently and hold 30 seconds Standing on One Leg: Able to lift leg independently and hold 5-10 seconds Total Score: 45 Timed Up and Go Test TUG: Normal TUG Normal TUG (seconds): 25.5 Static Sitting Balance Static Sitting - Balance Support: Feet supported Static Sitting - Level of Assistance: 7: Independent Dynamic Sitting Balance Dynamic Sitting - Balance Support: Feet supported Dynamic Sitting - Level of Assistance: 7: Independent Static Standing Balance Static Standing - Balance Support: During functional activity Static Standing - Level of Assistance: 5: Stand by assistance Dynamic Standing  Balance Dynamic Standing - Balance Support: Bilateral upper extremity supported;During functional activity Dynamic Standing - Level of Assistance: 5: Stand by assistance Extremity Assessment  RLE Assessment RLE Assessment: Exceptions to [facility]  Active Range of Motion (AROM) Comments: WNL General Strength Comments: grossly 4+/5 to 5/5 throughout except 3+/5 in hip flexors LLE Assessment LLE Assessment: Exceptions to [facility] Active Range of Motion (AROM) Comments: WNL LLE Strength Left Hip Flexion: 3+/5 Left Knee Flexion: 4+/5 Left Knee Extension: 4+/5 Left Ankle Dorsiflexion: 4+/5 Left Ankle Plantar Flexion: 4+/5    Tawana Scale , PT, DPT, CSRS  06/05/2020, 1:00 PM

## 2020-06-05 NOTE — Progress Notes (Signed)
Orthopedic Tech Progress Note Patient Details:  [name] [birth year] [id number] Called in resting hand splint Ortho Devices Type of Ortho Device: Prafo boot/shoe Ortho Device/Splint Interventions: Ordered   Post Interventions Patient Tolerated: Other (comment) Instructions Provided: Adjustment of device,Care of device,Poper ambulation with device   [name] 06/05/2020, 11:32 AM

## 2020-06-05 NOTE — Progress Notes (Signed)
PHYSICAL MEDICINE & REHABILITATION PROGRESS NOTE   Subjective/Complaints:  Discussed no driving post d/c  ROS:neg CP, SOB, N/V/D  Objective:   No results found. No results for input(s): WBC, HGB, HCT, PLT in the last 72 hours. No results for input(s): NA, K, CL, CO2, GLUCOSE, BUN, CREATININE, CALCIUM in the last 72 hours.  Intake/Output Summary (Last 24 hours) at 06/05/2020 0733 Last data filed at 06/04/2020 2046 Gross per 24 hour  Intake 620 ml  Output --  Net 620 ml        Physical Exam: Vital Signs Blood pressure 128/69, pulse 68, temperature 97.7 F (36.5 C), temperature source Oral, resp. rate 20, height _0  (1.626 m), weight 69.2 kg, SpO2 98 %.  General: No acute distress Mood and affect are appropriate Heart: Regular rate and rhythm no rubs murmurs or extra sounds Lungs: Clear to auscultation, breathing unlabored, no rales or wheezes Abdomen: Positive bowel sounds, soft nontender to palpation, nondistended Extremities: No clubbing, cyanosis, or edema Skin: No evidence of breakdown, no evidence of rash  Musculoskeletal:     Comments:   Skin:    General: Skin is warm and dry.     Comments: L wrist in splint wrapped with ACE wrap- fingers moving and hand warm   Neurological:     Mental Status: She is alert and oriented to person, place, and time.   RUE- biceps, triceps, WE, 4+/5 and grip and finger abd 5-/5 LUE- biceps, triceps 4+/5, WE- NT due ot fx, grip and finger abd 5-./5 RLE- HF, KE, KF ,DF and PF 5/5 LLE- 5-/5 in same muscles B/L  Assessment/Plan: 1. Functional deficits which require 3+ hours per day of interdisciplinary therapy in a comprehensive inpatient rehab setting.  Physiatrist is providing close team supervision and 24 hour management of active medical problems listed below.  Physiatrist and rehab team continue to assess barriers to discharge/monitor patient progress toward functional and medical goals  Care  Tool:  Bathing    Body parts bathed by patient: Right arm,Left arm,Chest,Abdomen,Front perineal area,Buttocks,Face,Left lower leg,Right lower leg,Left upper leg,Right upper leg         Bathing assist Assist Level: Contact Guard/Touching assist     Upper Body Dressing/Undressing Upper body dressing   What is the patient wearing?: Pull over shirt    Upper body assist Assist Level: Supervision/Verbal cueing    Lower Body Dressing/Undressing Lower body dressing      What is the patient wearing?: Underwear/pull up,Pants     Lower body assist Assist for lower body dressing: Supervision/Verbal cueing     Toileting Toileting    Toileting assist Assist for toileting: Supervision/Verbal cueing     Transfers Chair/bed transfer  Transfers assist     Chair/bed transfer assist level: Supervision/Verbal cueing Chair/bed transfer assistive device: Programmer, multimedia   Ambulation assist      Assist level: Supervision/Verbal cueing Assistive device: Walker-platform Max distance: ~200   Walk 10 feet activity   Assist     Assist level: Supervision/Verbal cueing Assistive device: Walker-platform   Walk 50 feet activity   Assist    Assist level: Supervision/Verbal cueing Assistive device: Walker-platform    Walk 150 feet activity   Assist Walk 150 feet activity did not occur: Safety/medical concerns  Assist level: Supervision/Verbal cueing Assistive device: Walker-platform    Walk 10 feet on uneven surface  activity   Assist     Assist level: Contact Guard/Touching assist Assistive device: Engineer, drilling  Assist Will patient use wheelchair at discharge?: No             Wheelchair 50 feet with 2 turns activity    Assist            Wheelchair 150 feet activity     Assist          Blood pressure 128/69, pulse 68, temperature 97.7 F (36.5 C), temperature source Oral, resp. rate 20,  height _0  (1.626 m), weight 69.2 kg, SpO2 98 %.   Medical Problem List and Plan: 1.  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 Formulized individual treatment plan based on medical history, underlying problem and comorbidities.  2.  Antithrombotics: -DVT/anticoagulation:  Pharmaceutical: Lovenox             -antiplatelet therapy: DAPT 3. Pain Management: ultram and flexeril prn- added lidoderm patches to neck 9pm to 9am and kpad for heat. continue current regimen.  4. Mood:  LCSW to follow for evaluation and support.              -antipsychotic agents: N/A 5. Neuropsych: This patient is capable of making decisions on her own behalf. 6. Skin/Wound Care: Routine pressure relief measures.  7. Fluids/Electrolytes/Nutrition: Monitor I/O.  Electrolytes are stable- continue to monitor weekly.  8. Dyslipidemia: Continue Lipitor.  9. L wrist- triquetral avulsion fx- NWB on LUE- can use platform walker. Ordered XR 12/20 to assess healing and appears stable. Discussed that typical NWB status is at least 2 weeks and until cleared by ortho.  10. Constipation- having regular BM- continue 3 prunes daily- Last BM 12/21 11. 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  12. HTN: BP has been labile: continue to monitor Vitals:   06/04/20 1931 06/05/20 0447  BP: (!) 126/48 128/69  Pulse: 71 68  Resp: 16 20  Temp: 97.7 F (36.5 C) 97.7 F (36.5 C)  SpO2: 96% 98%  Controlled 12/23  LOS: 5 days A FACE TO FACE EVALUATION WAS PERFORMED  Charlett Blake 06/05/2020, 7:33 AM

## 2020-06-05 NOTE — Progress Notes (Signed)
Occupational Therapy Discharge Summary  Patient Details  Name: [name] MRN: [id number] Date of Birth: 09/26/1940  Today's Date: 06/05/2020 OT Individual Time: 0903-1003 OT Individual Time Calculation (min): 60 min    Patient has met 15 of 15 long term goals due to improved activity tolerance, improved balance and ability to compensate for deficits, abiding by L wrist NWB precautions during ADLs, and safe use of PFRW. Patient to discharge at overall Supervision level.  Patient's care partner is independent to provide the necessary physical assistance at discharge.  See session note for further details.  Recommendation:  Patient operating at overall mod I to close supervision level, remaining high level balance deficits will be addressed by PT f/u. No further skilled OT recommended at this time.  Equipment: 3in1  Reasons for discharge: discharge from hospital  Patient/family agrees with progress made and goals achieved: Yes  OT Discharge Precautions/Restrictions  Precautions Precautions: Fall Precaution Comments: please have patient wear her shoes for transfer/ambulation (left with lift for leg length discrepancy) Required Braces or Orthoses: Splint/Cast Splint/Cast: L wrist soft slplint Restrictions Weight Bearing Restrictions: Yes LUE Weight Bearing: Non weight bearing Other Position/Activity Restrictions: Assumed NWB on L wrist  Pain Pain Assessment Pain Scale: 0-10 Pain Score: 0-No pain ADL ADL Eating: Independent Where Assessed-Eating: Chair Grooming: Supervision/safety Where Assessed-Grooming: Standing at sink Upper Body Bathing: Independent Where Assessed-Upper Body Bathing: Chair Lower Body Bathing: Supervision/safety Where Assessed-Lower Body Bathing: Chair Upper Body Dressing: Independent Where Assessed-Upper Body Dressing: Chair Lower Body Dressing: Supervision/safety Where Assessed-Lower Body Dressing: Chair Toileting: Supervision/safety Where  Assessed-Toileting: Toilet Toilet Transfer: Close supervision Toilet Transfer Method: Ambulating Toilet Transfer Equipment: Grab bars Walk-In Shower Transfer: Close supervision Walk-In Shower Transfer Method: Ambulating Walk-In Shower Equipment: Shower seat with back Vision Baseline Vision/History: Wears glasses Wears Glasses: At all times Patient Visual Report: No change from baseline Vision Assessment?: Yes Eye Alignment: Within Functional Limits Ocular Range of Motion: Within Functional Limits Alignment/Gaze Preference: Within Defined Limits Tracking/Visual Pursuits: Able to track stimulus in all quads without difficulty Saccades: Within functional limits Convergence: Within functional limits Visual Fields: No apparent deficits Perception  Perception: Within Functional Limits Praxis Praxis: Intact Cognition Overall Cognitive Status: Within Functional Limits for tasks assessed Arousal/Alertness: Awake/alert Orientation Level: Oriented X4 Attention: Alternating;Focused;Sustained;Selective Focused Attention: Appears intact Sustained Attention: Appears intact Alternating Attention: Appears intact Memory: Appears intact Safety/Judgment: Appears intact Sensation Sensation Light Touch:  (LLE below knee, hx of neuropathy) Hot/Cold: Not tested Proprioception: Appears Intact Stereognosis: Appears Intact Additional Comments: L hand/wrist/forearm in soft cast, digits move without difficulty, unable to formally assess L sensation of LUE, RUE intact Coordination Gross Motor Movements are Fluid and Coordinated: Yes Fine Motor Movements are Fluid and Coordinated: Yes (WFL, L FMC restricted 2/2 soft cast) Motor  Motor Motor: Hemiplegia Motor - Discharge Observations: mild LLE weakness Mobility  Bed Mobility Bed Mobility: Rolling Right;Rolling Left;Supine to Sit;Sit to Supine;Left Sidelying to Sit;Right Sidelying to Sit;Scooting to HOB Rolling Right: Independent Rolling Left:  Independent Right Sidelying to Sit: Independent Left Sidelying to Sit: Independent Supine to Sit: Independent Sit to Supine: Independent Scooting to HOB: Independent Transfers Sit to Stand: Supervision/Verbal cueing Stand to Sit: Supervision/Verbal cueing  Trunk/Postural Assessment  Cervical Assessment Cervical Assessment: Within Functional Limits Thoracic Assessment Thoracic Assessment: Exceptions to WFL (premorbid kyphotic posture) Lumbar Assessment Lumbar Assessment: Within Functional Limits Postural Control Postural Control: Within Functional Limits  Balance Balance Balance Assessed: Yes Static Sitting Balance Static Sitting - Balance Support: Feet supported Static Sitting -   Level of Assistance: 7: Independent Dynamic Sitting Balance Dynamic Sitting - Balance Support: Feet supported Dynamic Sitting - Level of Assistance: 7: Independent Static Standing Balance Static Standing - Balance Support: No upper extremity supported Static Standing - Level of Assistance: 5: Stand by assistance Dynamic Standing Balance Dynamic Standing - Balance Support: Bilateral upper extremity supported Dynamic Standing - Level of Assistance: 5: Stand by assistance Extremity/Trunk Assessment RUE Assessment RUE Assessment: Within Functional Limits Active Range of Motion (AROM) Comments: grossly WFL, reports hx of B rotator cuff tear General Strength Comments: 5/5 LUE Assessment LUE Assessment: Exceptions to WFL Active Range of Motion (AROM) Comments: L digits move freely, grossly WFL for L shoulder ROM for ADL tasks, unable to assess L wrist ROM 2/2 cast General Strength Comments: Unable to formally assess 2/2 L wrist NWB, L wrist immobilized in soft cast  Session Note: Pt received sitting in chair with chair alarm engaged, agreeable to therapy. Session focus on kitchen/shower mobility + seated bathing/dressing. Denies pain this session. Sit>stand with PFRW and amb to therapy apartment with close  sup. Simulated shower transfer with wooden lip + shower chair, pt states this chair is like the one she has at home, with overall close supervision + PFRW. In kitchen, retrieved plate from upper cabinet and pot from lower cabinet, transported plate down countertop with PFRW with close supervision. Amb back to room with PFRW + close supervision, as pt requesting to get washed up, but declined shower. Seated in chair, doffed shirt, bra, B socks, B shoes with ind. Close supervision for sit <> stand to doff underwear + pants 2/2 decreased balance. Bathed UB, applied deodorant/lotion with ind. Bathed LB with close supervision for sit<>stand to bathe buttocks. Donned new shirt, bra seated with ind. Donned underwear, pants with close supervision for sit<>stand. Req min VCs to follow L wrist NWB precautions when donning pants, as she stabilized herself with her LUE on the chair armrest. Applied lotion to LB with mod I seated in chair.  Additionally provided printed BUE FMC HEP, reviewed activities (card turning, digit-palm translation with coins) and pt verbalized understanding. Additionally reviewed fall prevention strategies (removing throw rugs) and rec pt's husband provide close supervision for shower transfers and standing ADLs at home. Pt with no additional questions, states husband will be able to provide appropriate supervision, and verbalized understanding. Pt left in chair with chair alarm engaged, call bell in reach, all immediate needs met.   Rachel A Stevenson, MS, OTRL 06/05/2020, 10:37 AM  

## 2020-06-06 DIAGNOSIS — K5900 Constipation, unspecified: Secondary | ICD-10-CM

## 2020-06-06 DIAGNOSIS — M2170 Unequal limb length (acquired), unspecified site: Secondary | ICD-10-CM | POA: Insufficient documentation

## 2020-06-06 DIAGNOSIS — M542 Cervicalgia: Secondary | ICD-10-CM

## 2020-06-06 DIAGNOSIS — G8929 Other chronic pain: Secondary | ICD-10-CM

## 2020-06-06 DIAGNOSIS — I63031 Cerebral infarction due to thrombosis of right carotid artery: Secondary | ICD-10-CM

## 2020-06-06 NOTE — Plan of Care (Addendum)
Discharge instructions reviewed w/ patient and family members. Care plane completed. Medications reviewed. Pt and belongings walked out by staff. Pt in no distress, alert and oriented x4. Denied any issues before DC.

## 2020-06-06 NOTE — Discharge Instructions (Signed)
Inpatient Rehab Discharge Instructions  STEVI HOLLINSHEAD Discharge date and time: 06/06/20  Activities/Precautions/ Functional Status: Activity: no lifting, driving, or strenuous exercise  till cleared by MD Diet: cardiac diet Wound Care: keep wound clean and dry    Functional status:  ___ No restrictions     ___ Walk up steps independently _X__ 24/7 supervision/assistance   ___ Walk up steps with assistance ___ Intermittent supervision/assistance  ___ Bathe/dress independently ___ Walk with walker     ___ Bathe/dress with assistance ___ Walk Independently    ___ Shower independently ___ Walk with assistance    _X__ Shower with assistance _X__ No alcohol     ___ Return to work/school ________  COMMUNITY REFERRALS UPON DISCHARGE:    Outpatient: PT    [facility]               Agency: [facility]  Phone: [phone]              Appointment Date/Time: Facility to Schedule at Discharge   Medical Equipment/Items Ordered: Bedside Commode, Rolling Walker, L Platform                                                 Agency/Supplier: [employer]  Special Instructions: 1. No weight on left wrist till follow up with orthopedics   STROKE/TIA DISCHARGE INSTRUCTIONS SMOKING Cigarette smoking nearly doubles your risk of having a stroke & is the single most alterable risk factor  If you smoke or have smoked in the last 12 months, you are advised to quit smoking for your health.  Most of the excess cardiovascular risk related to smoking disappears within a year of stopping.  Ask you doctor about anti-smoking medications  [place] Quit Line: 1-800-QUIT NOW  Free Smoking Cessation Classes [phone]  CHOLESTEROL Know your levels; limit fat & cholesterol in your diet  Lipid Panel     Component Value Date/Time   CHOL 234 (H) 05/28/2020 0334   TRIG 135 05/28/2020 0334   HDL 68 05/28/2020 0334   CHOLHDL 3.4 05/28/2020 0334   VLDL 27 05/28/2020 0334    LDLCALC 139 (H) 05/28/2020 0334      Many patients benefit from treatment even if their cholesterol is at goal.  Goal: Total Cholesterol (CHOL) less than 160  Goal:  Triglycerides (TRIG) less than 150  Goal:  HDL greater than 40  Goal:  LDL (LDLCALC) less than 100   BLOOD PRESSURE American Stroke Association blood pressure target is less that 120/80 mm/Hg  Your discharge blood pressure is:  BP: (!) 133/58  Monitor your blood pressure  Limit your salt and alcohol intake  Many individuals will require more than one medication for high blood pressure  DIABETES (A1c is a blood sugar average for last 3 months) Goal HGBA1c is under 7% (HBGA1c is blood sugar average for last 3 months)  Diabetes:     Lab Results  Component Value Date   HGBA1C 5.7 (H) 05/28/2020     Your HGBA1c can be lowered with medications, healthy diet, and exercise.  Check your blood sugar as directed by your physician  Call your physician if you experience unexplained or low blood sugars.  PHYSICAL ACTIVITY/REHABILITATION Goal is 30 minutes at least 4 days per week  Activity: Increase activity slowly, and No driving, Therapies: see above Return to  work: N/A  Activity decreases your risk of heart attack and stroke and makes your heart stronger.  It helps control your weight and blood pressure; helps you relax and can improve your mood.  Participate in a regular exercise program.  Talk with your doctor about the best form of exercise for you (dancing, walking, swimming, cycling).  DIET/WEIGHT Goal is to maintain a healthy weight  Your discharge diet is:  Diet Order            Diet Heart Room service appropriate? Yes; Fluid consistency: Thin  Diet effective now               thin liquids Your height is:  Height: 5\' 4"  (162.6 cm) Your current weight is: 152 lbs Your Body Mass Index (BMI) is:  BMI (Calculated): 26.17  Following the type of diet specifically designed for you will help prevent another  stroke.  Your goal weight is 145 lbs  Your goal Body Mass Index (BMI) is 19-24.  Healthy food habits can help reduce 3 risk factors for stroke:  High cholesterol, hypertension, and excess weight.  RESOURCES Stroke/Support Group:  Call [phone]   STROKE EDUCATION PROVIDED/REVIEWED AND GIVEN TO PATIENT Stroke warning signs and symptoms How to activate emergency medical system (call 911). Medications prescribed at discharge. Need for follow-up after discharge. Personal risk factors for stroke. Pneumonia vaccine given:  Flu vaccine given:  My questions have been answered, the writing is legible, and I understand these instructions.  I will adhere to these goals & educational materials that have been provided to me after my discharge from the hospital.     My questions have been answered and I understand these instructions. I will adhere to these goals and the provided educational materials after my discharge from the hospital.  Patient/Caregiver Signature _______________________________ Date __________  Clinician Signature _______________________________________ Date __________  Please bring this form and your medication list with you to all your follow-up doctor's appointments.

## 2020-06-06 NOTE — Progress Notes (Signed)
[place] PHYSICAL MEDICINE & REHABILITATION PROGRESS NOTE   Subjective/Complaints:  No issues overnite. Discussed meds, supplements and f/u apts  ROS:neg CP, SOB, N/V/D  Objective:   No results found. No results for input(s): WBC, HGB, HCT, PLT in the last 72 hours. No results for input(s): NA, K, CL, CO2, GLUCOSE, BUN, CREATININE, CALCIUM in the last 72 hours. No intake or output data in the 24 hours ending 06/06/20 0742      Physical Exam: Vital Signs Blood pressure (!) 128/59, pulse 66, temperature 97.6 F (36.4 C), resp. rate 16, height 5\' 4"  (1.626 m), weight 69.2 kg, SpO2 95 %.  General: No acute distress Mood and affect are appropriate Heart: Regular rate and rhythm no rubs murmurs or extra sounds Lungs: Clear to auscultation, breathing unlabored, no rales or wheezes Abdomen: Positive bowel sounds, soft nontender to palpation, nondistended Extremities: No clubbing, cyanosis, or edema Skin: No evidence of breakdown, no evidence of rash  Musculoskeletal:     Comments:   Skin:    General: Skin is warm and dry.     Comments: L wrist in splint wrapped with ACE wrap- fingers moving and hand warm   Neurological:     Mental Status: She is alert and oriented to person, place, and time.   RUE- biceps, triceps, WE, 4+/5 and grip and finger abd 5-/5 LUE- biceps, triceps 4+/5, WE- NT due ot fx, grip and finger abd 5-./5 RLE- HF, KE, KF ,DF and PF 5/5 LLE- 5-/5 in same muscles B/L  Assessment/Plan: 1. 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 Stable for D/C today F/u PCP in 3-4 weeks F/u PM&R 2 weeks See D/C summary See D/C instructions  Care Tool:  Bathing    Body parts bathed by patient: Right arm,Left arm,Chest,Abdomen,Front perineal area,Buttocks,Face,Left lower leg,Right lower leg,Left upper leg,Right upper leg         Bathing assist Assist Level: Supervision/Verbal cueing     Upper Body Dressing/Undressing Upper body dressing   What is the patient wearing?:  Pull over shirt,Bra    Upper body assist Assist Level: Independent    Lower Body Dressing/Undressing Lower body dressing      What is the patient wearing?: Underwear/pull up,Pants     Lower body assist Assist for lower body dressing: Supervision/Verbal cueing     Toileting Toileting    Toileting assist Assist for toileting: Supervision/Verbal cueing     Transfers Chair/bed transfer  Transfers assist     Chair/bed transfer assist level: Supervision/Verbal cueing Chair/bed transfer assistive device: Other (L PFRW)   Locomotion Ambulation   Ambulation assist      Assist level: Supervision/Verbal cueing Assistive device: Walker-platform Max distance: ~200   Walk 10 feet activity   Assist     Assist level: Supervision/Verbal cueing Assistive device: Walker-platform   Walk 50 feet activity   Assist    Assist level: Supervision/Verbal cueing Assistive device: Walker-platform    Walk 150 feet activity   Assist Walk 150 feet activity did not occur: Safety/medical concerns  Assist level: Supervision/Verbal cueing Assistive device: Walker-platform    Walk 10 feet on uneven surface  activity   Assist     Assist level: Supervision/Verbal cueing Assistive device: Walker-platform   Wheelchair     Assist Will patient use wheelchair at discharge?: No             Wheelchair 50 feet with 2 turns activity    Assist            Wheelchair  150 feet activity     Assist          Blood pressure (!) 128/59, pulse 66, temperature 97.6 F (36.4 C), resp. rate 16, height 5\' 4"  (1.626 m), weight 69.2 kg, SpO2 95 %.   Medical Problem List and Plan: 1.  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  Antithrombotics: -DVT/anticoagulation:  Pharmaceutical: Lovenox             -antiplatelet therapy: DAPT 3. Pain  Management: ultram and flexeril prn- added lidoderm patches to neck 9pm to 9am and kpad for heat. continue current regimen.  4. Mood:  LCSW to follow for evaluation and support.              -antipsychotic agents: N/A 5. Neuropsych: This patient is capable of making decisions on her own behalf. 6. Skin/Wound Care: Routine pressure relief measures.  7. Fluids/Electrolytes/Nutrition: Monitor I/O.  Electrolytes are stable- continue to monitor weekly.  8. Dyslipidemia: Continue Lipitor.  9. L wrist- triquetral avulsion fx- NWB on LUE- can use platform walker. Ordered XR 12/20 to assess healing and appears stable. Discussed that typical NWB status is at least 2 weeks and until cleared by ortho.  10. Constipation- having regular BM- continue 3 prunes daily- Last BM 12/21 11. 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  12. HTN: BP has been labile: continue to monitor Vitals:   06/05/20 1936 06/06/20 0435  BP: 136/69 (!) 128/59  Pulse: 84 66  Resp: 20 16  Temp: 98.1 F (36.7 C) 97.6 F (36.4 C)  SpO2: 93% 95%  Controlled 12/24  LOS: 6 days A FACE TO FACE EVALUATION WAS PERFORMED  [name] 06/06/2020, 7:42 AM

## 2020-06-06 NOTE — Progress Notes (Signed)
Inpatient Rehabilitation Care Coordinator Discharge Note  The overall goal for the admission was met for:   Discharge location: Yes, home  Length of Stay: Yes, 6 Days  Discharge activity level: Yes, ambulatory level using L platform RW requiring Supervision.  Home/community participation: Yes  Services provided included: MD, RD, PT, OT, SLP, RN, CM, TR, Pharmacy, Neuropsych and SW  Financial Services: Private Insurance: HTA  Choices offered to/list presented [id number]  Follow-up services arranged: Outpatient: [facility]  Comments (or additional information): PT Dora RW W/ L Platform, Bedside Commode  Patient/Family verbalized understanding of follow-up arrangements: Yes  Individual responsible for coordination of the follow-up plan: Patient [phone]  Confirmed correct DME delivered: Dyanne Iha 06/06/2020    Dyanne Iha

## 2020-06-09 ENCOUNTER — Telehealth: Payer: Self-pay

## 2020-06-09 NOTE — Telephone Encounter (Signed)
Transitional Care call-self-patient   1. Are you/is patient experiencing any problems since coming home? No Are there any questions regarding any aspect of care? No 2. Are there any questions regarding medications administration/dosing? No  Are meds being taken as prescribed? Yes Patient should review meds with caller to confirm 3. Have there been any falls? 4. Has Home Health been to the house and/or have they contacted you? Outpatient Rehab If not, have you tried to contact them? Can we help you contact them? 5. Are bowels and bladder emptying properly? Yes Are there any unexpected incontinence issues? No If applicable, is patient following bowel/bladder programs? 6. Any fevers, problems with breathing, unexpected pain? No 7. Are there any skin problems or new areas of breakdown? No 8. Has the patient/family member arranged specialty MD follow up (ie cardiology/neurology/renal/surgical/etc)? Yes Can we help arrange? 9. Does the patient need any other services or support that we can help arrange? No 10. Are caregivers following through as expected in assisting the patient? Yes 11. Has the patient quit smoking, drinking alcohol, or using drugs as recommended? Yes  Appointment time 1:30 pm, arrive time 1:00 pm with Dr. Wynn Banker  [address] [phone]

## 2020-06-10 DIAGNOSIS — S62135A Nondisplaced fracture of capitate [os magnum] bone, left wrist, initial encounter for closed fracture: Secondary | ICD-10-CM | POA: Diagnosis not present

## 2020-06-12 ENCOUNTER — Other Ambulatory Visit: Payer: Self-pay

## 2020-06-12 ENCOUNTER — Ambulatory Visit (INDEPENDENT_AMBULATORY_CARE_PROVIDER_SITE_OTHER): Payer: PPO | Admitting: Emergency Medicine

## 2020-06-12 DIAGNOSIS — I639 Cerebral infarction, unspecified: Secondary | ICD-10-CM

## 2020-06-12 LAB — CUP PACEART INCLINIC DEVICE CHECK
Date Time Interrogation Session: 20211230143852
Implantable Pulse Generator Implant Date: 20211217

## 2020-06-12 NOTE — Progress Notes (Signed)
Loop check in clinic. Battery status good R-waves  0.26mV. No symptom episodes, no tachy episodes, no pause episodes, no brady episodes. No AF episodes 0 % burden). Next remote 07/07/2020.

## 2020-06-16 ENCOUNTER — Ambulatory Visit: Payer: PPO | Admitting: Occupational Therapy

## 2020-06-16 ENCOUNTER — Ambulatory Visit: Payer: PPO | Admitting: Rehabilitation

## 2020-06-18 DIAGNOSIS — Z8673 Personal history of transient ischemic attack (TIA), and cerebral infarction without residual deficits: Secondary | ICD-10-CM | POA: Diagnosis not present

## 2020-06-18 DIAGNOSIS — S62102A Fracture of unspecified carpal bone, left wrist, initial encounter for closed fracture: Secondary | ICD-10-CM | POA: Diagnosis not present

## 2020-06-19 ENCOUNTER — Ambulatory Visit: Payer: Medicare HMO | Admitting: Physical Therapy

## 2020-06-19 ENCOUNTER — Ambulatory Visit: Payer: Medicare HMO | Attending: Physical Medicine and Rehabilitation | Admitting: Occupational Therapy

## 2020-06-19 ENCOUNTER — Other Ambulatory Visit: Payer: Self-pay

## 2020-06-19 ENCOUNTER — Encounter: Payer: Self-pay | Admitting: Occupational Therapy

## 2020-06-19 VITALS — BP 143/86 | HR 77

## 2020-06-19 DIAGNOSIS — R2681 Unsteadiness on feet: Secondary | ICD-10-CM

## 2020-06-19 DIAGNOSIS — R208 Other disturbances of skin sensation: Secondary | ICD-10-CM | POA: Diagnosis not present

## 2020-06-19 DIAGNOSIS — R262 Difficulty in walking, not elsewhere classified: Secondary | ICD-10-CM

## 2020-06-19 DIAGNOSIS — M6281 Muscle weakness (generalized): Secondary | ICD-10-CM | POA: Insufficient documentation

## 2020-06-19 DIAGNOSIS — I69354 Hemiplegia and hemiparesis following cerebral infarction affecting left non-dominant side: Secondary | ICD-10-CM | POA: Insufficient documentation

## 2020-06-19 DIAGNOSIS — R293 Abnormal posture: Secondary | ICD-10-CM | POA: Diagnosis not present

## 2020-06-19 DIAGNOSIS — M79642 Pain in left hand: Secondary | ICD-10-CM | POA: Diagnosis not present

## 2020-06-19 NOTE — Therapy (Signed)
[facility] [address] [place], [place], [address] Phone: [phone]   Fax:  [phone]  Physical Therapy Evaluation  Patient Details  Name: [name] MRN: [id number] Date of Birth: [birth year] Referring Provider (PT): Letta Pate Luanna Salk, MD   Encounter Date: 06/19/2020   PT End of Session - 06/19/20 1651    Visit Number 1    Number of Visits 13    Date for PT Re-Evaluation 08/03/20    Authorization Type [employer]    PT Start Time [id number]    PT Stop Time 1445    PT Time Calculation (min) 40 min    Activity Tolerance Patient tolerated treatment well    Behavior During Therapy [facility] for tasks assessed/performed           Past Medical History:  Diagnosis Date  . Blood transfusion without reported diagnosis    with hip fracture-80 years old  . Cancer [facility]) 1983   Kidney Cancer  . Diverticulosis   . GERD (gastroesophageal reflux disease)   . Personal history of kidney cancer     Past Surgical History:  Procedure Laterality Date  . ABDOMINAL HYSTERECTOMY    . COLON SURGERY     Severe sigmoid diverticulitis  . COLONOSCOPY    . FOOT SURGERY    . HIP SURGERY    . Kidney cancer     Kidney removed  . LOOP RECORDER INSERTION N/A 05/30/2020   Procedure: LOOP RECORDER INSERTION;  Surgeon: Vickie Epley, MD;  Location: [place] [facility];  Service: Cardiovascular;  Laterality: N/A;  . POLYPECTOMY      Vitals:   06/19/20 1408  BP: (!) 143/86  Pulse: 77      Subjective Assessment - 06/19/20 1411    Subjective Pt had a fall on a Sunday outside when getting out of the car causing a L wrist fracture.  A couple days later pt had a HA and difficulty standing up; went to hospital and diagnosed with a CVA.  Pt reports since the CVA she is not walking as well and her stability is not as good.  Pt is NWB in L wrist and is using a platform RW but walks without the RW at home but furniture walks.  No falls  since D/C home.    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 Yes    Pain Score 4     Pain Location Other (Comment)   thumb   Pain Orientation Left    Pain Descriptors / Indicators Aching;Throbbing    Pain Type Acute pain              [facility] PT Assessment - 06/19/20 1417      Assessment   Medical Diagnosis CVA, L wrist fracture    Referring Provider (PT) Letta Pate Luanna Salk, MD    Onset Date/Surgical Date 06/05/21    Hand Dominance Right    Prior Therapy CIR - D/C 06/06/20      Precautions   Precautions Other (comment);Fall    Required Braces or Orthoses Other Brace/Splint    Other Brace/Splint L wrist splint, Shoe  lift on LLE due to LLD      Restrictions   Weight Bearing Restrictions Yes    LUE Weight Bearing Non weight bearing      Balance Screen   Has the patient fallen in the past 6 months Yes    How many times? 1      [place] residence    Living Arrangements Spouse/significant other    Type of [place] to enter    Entrance Stairs-Number of Steps 1    Entrance Stairs-Rails None    Home Layout One level    [place] - 2 wheels    Additional Comments has raised toilet seat but not using      Prior Function   Level of Independence Independent with basic ADLs;Independent    Vocation Retired    Leisure read devotions, contact with friends and family, church, watch TV      Observation/Other Assessments   Focus on Therapeutic Outcomes (FOTO)  66%      Sensation   Light Touch Impaired Detail    Light Touch Impaired  Details Impaired LLE    Additional Comments neuropathy; decreased to light touch L lower leg      Posture/Postural Control   Posture/Postural Control Postural limitations    Posture Comments pre-morbid scoliosis and LLD      ROM / Strength   AROM / PROM / Strength Strength      AROM   Overall AROM  --      Strength   Overall Strength Deficits    Overall Strength Comments RLE 5/5, LLE 4-/5 overall      Transfers   Transfers Sit to Stand;Stand to Sit    Sit to Stand 4: Min guard    Stand to Sit 5: Supervision    Transfer Cueing Cues to not push through LUE on arm rest when coming to stand.  Pt requires RUE to push to full stand and requires min A upon standing to stabilize balance      Ambulation/Gait   Ambulation/Gait Yes    Ambulation/Gait Assistance 5: Supervision;4: Min assist    Ambulation/Gait Assistance Details Pt initially ambulates with platform RW to support LUE at forearm.  Without RW pt requires min A for balance when ambulating without UE support.    Ambulation Distance (Feet) 230 Feet    Assistive device Left platform walker;None    Gait Pattern Step-through pattern;Decreased step length - right;Decreased step length - left;Decreased stride length;Trunk flexed    Ambulation Surface Level;Indoor    Stairs Yes    Stairs Assistance 4: Min guard    Stair Management Technique One rail Right;Alternating pattern;Step to pattern;Forwards    Number of Stairs 4    Height of Stairs 6    Gait Comments Stairs: alternating sequence to ascend, step to when descending      Standardized Balance Assessment   Standardized Balance Assessment Five Times Sit to Stand;10 meter walk test;Dynamic Gait Index    Five times sit to stand comments  16.22 seconds from arm chair, using RUE to push up, did not let go of arm rest.  Did not fully extend legs.  Uncontrolled stand > sit    10 Meter Walk 18.12 seconds or 1.8 ft/sec      Dynamic Gait Index   Level Surface Moderate Impairment     Change in Gait Speed Moderate Impairment    Gait with Horizontal  Head Turns Mild Impairment    Gait with Vertical Head Turns Mild Impairment    Gait and Pivot Turn Mild Impairment    Step Over Obstacle Mild Impairment    Step Around Obstacles Mild Impairment    Steps Mild Impairment    Total Score 14    DGI comment: 14/24                      Objective measurements completed on examination: See above findings.               PT Education - 06/19/20 1650    Education Details clinical findings, PT POC and goals    Person(s) Educated Patient    Methods Explanation    Comprehension Verbalized understanding            PT Short Term Goals - 06/19/20 1713      PT SHORT TERM GOAL #1   Title Pt will initiate strengthening and balance HEP    Time 3    Period Weeks    Status New    Target Date 07/10/20      PT SHORT TERM GOAL #2   Title Pt will perform five time sit to stand from chair without use of RUE, safely in </= 14 seconds    Baseline 16.22, RUE support, uncontrolled stand > sit    Time 3    Period Weeks    Status New    Target Date 07/10/20      PT SHORT TERM GOAL #3   Title Pt will increase gait velocity to 2.2 ft/sec without AD    Baseline 1.8 ft/sec without AD    Time 3    Period Weeks    Status New    Target Date 07/10/20      PT SHORT TERM GOAL #4   Title Pt will increase DGI by 3 points to indicate decreased falls risk    Baseline 14/24    Time 3    Period Weeks    Status New    Target Date 07/10/20             PT Long Term Goals - 06/19/20 1716      PT LONG TERM GOAL #1   Title Pt will report improvement in overall UE function on FOTO to 78% by D/C from PT/OT    Baseline 66%    Time 6    Period Weeks    Status New    Target Date 08/03/20      PT LONG TERM GOAL #2   Title Pt will improve balance and functional strength to perform 5 time sit to stand without use of UE to </= 13 seconds    Time 6    Period Weeks     Status New    Target Date 08/03/20      PT LONG TERM GOAL #3   Title Pt will increase gait velocity without AD to >/= 2.62 ft/sec    Time 6    Period Weeks    Status New    Target Date 08/03/20      PT LONG TERM GOAL #4   Title Pt will increase DGI to >/= 19/24 to indicate low falls risk with ambulation    Time 6    Period Weeks    Status New    Target Date 08/03/20      PT LONG TERM GOAL #5   Title Pt will ambulate x  500' over indoor and paved outdoor surfaces MOD I without AD and without LOB    Time 6    Period Weeks    Status New    Target Date 08/03/20                  Plan - 06/19/20 1705    Clinical Impression Statement Pt is an 80 year old female referred to Neuro OPPT for evaluation of R frontal and medial parietal lobe; loop recorder placed.  Pt also experienced a fall prior to CVA resulting in L wrist fracture.  Pt's PMH is significant for the following: Blood transfusion, Kidney CA, Diverticulitis, foot surgery, hip surgery, loop recorder insertion, wrist fracture due to fall. The following deficits were noted during pt's exam: LLE weakness, impaired standing balance, impaired sensation in LLE and impaired posture.  Pt's five time sit to stand, gait velocity and DGI scores indicate pt is at high risk for falls. Pt would benefit from skilled PT to address these impairments and functional limitations to maximize functional mobility independence and reduce falls risk.    Personal Factors and Comorbidities Age;Comorbidity 3+    Comorbidities Blood transfusion, Kidney CA, Diverticulitis, foot surgery, hip surgery, loop recorder insertion, wrist fracture due to fall    Examination-Activity Limitations [place];Locomotion Level;Stairs;Stand;Transfers    Examination-Participation Restrictions Cleaning;Driving;Community Activity;Laundry;Meal Prep;Shop    Stability/Clinical Decision Making Evolving/Moderate complexity    Clinical Decision Making Moderate    Rehab Potential Good     PT Frequency 2x / week    PT Duration 6 weeks    PT Treatment/Interventions ADLs/Self Care Home Management;DME Instruction;Gait training;Stair training;Functional mobility training;Therapeutic activities;Therapeutic exercise;Balance training;Neuromuscular re-education;Patient/family education    PT Next Visit Plan Initiate balance and strengthening HEP; gait training without platform RW    Consulted and Agree with Plan of Care Patient           Patient will benefit from skilled therapeutic intervention in order to improve the following deficits and impairments:  Decreased balance,Decreased strength,Difficulty walking,Impaired sensation,Postural dysfunction  Visit Diagnosis: Hemiplegia and hemiparesis following cerebral infarction affecting left non-dominant side (HCC)  Other disturbances of skin sensation  Unsteadiness on feet  Difficulty in walking, not elsewhere classified  Abnormal posture     Problem List Patient Active Problem List   Diagnosis Date Noted  . Chronic neck pain 06/06/2020  . Constipation 06/06/2020  . 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  . Stroke (cerebrum) (HCC) 05/31/2020  . Cerebral ischemia 05/27/2020  . Left leg weakness 05/27/2020  . Triquetral fracture 05/27/2020  . Acute CVA (cerebrovascular accident) (HCC) 05/26/2020  . Left leg numbness 05/26/2020  . GERD (gastroesophageal reflux disease) 05/26/2020  . 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 Posterior vitreous detachment of both eyes 04/29/2020  . 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 Left epiretinal membrane 04/29/2020  . Pain in right knee 05/26/2018  . Bilateral lower abdominal pain 10/21/2010  . History of diverticulitis of colon 10/21/2010  . History of Clostridium difficile infection 10/21/2010    Dierdre Highman, PT, DPT 06/19/20    5:21 PM    [facility]  [facility] [address] [place], [place], [address] Phone: [phone]   Fax:  [phone]  Name: [name] MRN: [id number] Date of Birth: 07-Oct-1940   [facility] Medicare CPT codes: [phone]- Therapeutic Exercise, [phone]- Neuro Re-education, [phone] - Gait Training, [phone] - Therapeutic Activities and [phone] - Self  Care

## 2020-06-19 NOTE — Therapy (Signed)
[facility] [address] [place], [place], [address] Phone: [phone]   Fax:  [phone]  Occupational Therapy Evaluation  Patient Details  Name: [name] MRN: [id number] Date of Birth: Oct 17, 1940 Referring Provider (OT): Algis Liming   Encounter Date: 06/19/2020   OT End of Session - 06/19/20 1529    Visit Number 1    Number of Visits 9    Date for OT Re-Evaluation 09/02/20    Authorization Type --    OT Start Time [id number]    OT Stop Time 1400    OT Time Calculation (min) 43 min    Activity Tolerance Patient tolerated treatment well    Behavior During Therapy [facility] for tasks assessed/performed           Past Medical History:  Diagnosis Date  . Blood transfusion without reported diagnosis    with hip fracture-80 years old  . Cancer [facility]) 1983   Kidney Cancer  . Diverticulosis   . GERD (gastroesophageal reflux disease)   . Personal history of kidney cancer     Past Surgical History:  Procedure Laterality Date  . ABDOMINAL HYSTERECTOMY    . COLON SURGERY     Severe sigmoid diverticulitis  . COLONOSCOPY    . FOOT SURGERY    . HIP SURGERY    . Kidney cancer     Kidney removed  . LOOP RECORDER INSERTION N/A 05/30/2020   Procedure: LOOP RECORDER INSERTION;  Surgeon: Vickie Epley, MD;  Location: [facility] [facility];  Service: Cardiovascular;  Laterality: N/A;  . POLYPECTOMY      There were no vitals filed for this visit.   Subjective Assessment - 06/19/20 1330    Subjective  Patient indicates that she fell and broke her wrist, and then later had a stroke    Pertinent History Kidney cancer - removed right kidney    Currently in Pain? No/denies    Pain Score 0-No pain             [facility] OT Assessment - 06/19/20 0001      Assessment   Medical Diagnosis Stroke and wrist fracture    Referring Provider (OT) Pam Love    Onset Date/Surgical Date 05/25/20    Hand Dominance Right    Prior Therapy  CIR - D/C 06/06/20      Precautions   Required Braces or Orthoses Other Brace/Splint   Wrist brace     Prior Function   Level of Independence Independent with basic ADLs;Independent    Vocation Retired    Leisure read devotions, contact with friends and family, church, watch TV      ADL   Eating/Feeding Modified independent    Grooming Modified independent    Upper Body Bathing Modified independent    Lower Body Bathing Modified independent    Upper Body Dressing Increased time    Lower Body Dressing Modified independent    Toilet Transfer Modified independent    Toileting - Clothing Manipulation Modified independent    [place] Transfer Supervision/safety    ADL comments Uses shower seat to sit and blowdry and curl hair      IADL   Prior Level of Function Shopping Independent    Shopping Needs to be accompanied on any shopping trip    Prior Level of Function Light Housekeeping Independent    Light Housekeeping Performs light daily tasks such as dishwashing, bed making  Prior Level of Function Meal Prep Independent    Meal Prep Able to complete simple warm meal prep    Prior Level of Function Scientist, research (physical sciences) Relies on family or friends for transportation    Prior Level of Function Medication Managment Independent    Medication Management Is responsible for taking medication in correct dosages at correct time    Prior Level of Function Therapist, sports financial matters independently (budgets, writes checks, pays rent, bills goes to bank), collects and keeps track of income      Written Expression   Dominant Hand Right    Handwriting 100% legible      Vision - History   Baseline Vision Wears glasses all the time    Visual History Other (comment)   floaters   Additional Comments Notes no problems      Vision Assessment   Eye Alignment  Within Functional Limits    Ocular Range of Motion Within Functional Limits    Alignment/Gaze Preference Within Defined Limits    Tracking/Visual Pursuits Able to track stimulus in all quads without difficulty      Cognition   Overall Cognitive Status Within Functional Limits for tasks assessed      Posture/Postural Control   Posture/Postural Control No significant limitations      Sensation   Light Touch Appears Intact    Stereognosis Appears Intact      Coordination   Gross Motor Movements are Fluid and Coordinated Yes    9 Hole Peg Test Right;Left    Right 9 Hole Peg Test 19.97    Left 9 Hole Peg Test 21.66      Perception   Perception Within Functional Limits      Praxis   Praxis Intact      ROM / Strength   AROM / PROM / Strength AROM      AROM   Overall AROM  Within functional limits for tasks performed    Overall AROM Comments H/O shoulder problems      Hand Function   Right Hand Gross Grasp Functional    Right Hand Grip (lbs) 36    Right Hand Lateral Pinch 12 lbs    Left Hand Gross Grasp Impaired    Left Hand Grip (lbs) 20    Left Hand Lateral Pinch 10 lbs                           OT Education - 06/19/20 1528    Education Details Results of OT eval and potential plan of care    Person(s) Educated Patient    Methods Explanation    Comprehension Verbalized understanding               OT Long Term Goals - 06/19/20 1546      OT LONG TERM GOAL #1   Title Patient will complete HEP designed to improve hand strength due 08/03/20    Time 4    Period Weeks    Status New    Target Date 08/03/20      OT LONG TERM GOAL #2   Title Patient will shower with modified independence    Time 4    Period Weeks    Status New      OT LONG TERM GOAL #3   Title Patient will demonstrate 3 lb increase in left grip strength to improve functional grasp  with IADL    Time 4    Period Weeks    Status New      OT LONG TERM GOAL #4   Title Patient  will demonstrate awareness of return to driving recommendations    Time 4    Period Weeks    Status New                 Plan - 06/19/20 1530    Clinical Impression Statement Patient is an 80 yr old female who was admitted 05/26/20 after fall and progressive LLE weakness/numbness.  Patient with acute ischemia in right frontal and medial parietal lobe, likely embolic.  Loop recorder placed.  Patient with triquetrium avulsion fracture left wrist with conservative management.  Patient was discahrged from intensive inpatient reahb on 06/06/20.  Patient presents to OT evaluation as well dressed, bright, able to answer all historical questions.  Patient walking with whelled walker with platform for left UE.  Patient reports doing well at home, and improving with basic self care skills.  Patient demonstrates impairments with balance, pain, LUE strength which impact her autonomy with basic and advanced ADL's.  Patient will benefot from skilled OT to help her return to previous level of functioning.    OT Occupational Profile and History Problem Focused Assessment - Including review of records relating to presenting problem    Occupational performance deficits (Please refer to evaluation for details): ADL's;IADL's    Body Structure / Function / Physical Skills ADL;Endurance;UE functional use;Balance;Pain;IADL;Flexibility;Body mechanics;FMC;Mobility    Rehab Potential Excellent    Clinical Decision Making Limited treatment options, no task modification necessary    Comorbidities Affecting Occupational Performance: May have comorbidities impacting occupational performance    Modification or Assistance to Complete Evaluation  No modification of tasks or assist necessary to complete eval    OT Frequency 2x / week    OT Duration 4 weeks    OT Treatment/Interventions Self-care/ADL training;Therapeutic exercise;Moist Heat;Neuromuscular education;Splinting;Patient/family education;Therapeutic  activities;Balance training;Functional Mobility Training;Fluidtherapy;Cryotherapy;Ultrasound;DME and/or AE instruction;Manual Therapy    Plan putty HEP, Dynamic stand balance - her goal is to get off walker    Consulted and Agree with Plan of Care Patient           Patient will benefit from skilled therapeutic intervention in order to improve the following deficits and impairments:   Body Structure / Function / Physical Skills: ADL,Endurance,UE functional use,Balance,Pain,IADL,Flexibility,Body mechanics,FMC,Mobility       Visit Diagnosis: Muscle weakness (generalized) - Plan: Ot plan of care cert/re-cert  Unsteadiness on feet - Plan: Ot plan of care cert/re-cert  Pain in left hand - Plan: Ot plan of care cert/re-cert    Problem List Patient Active Problem List   Diagnosis Date Noted  . Chronic neck pain 06/06/2020  . Constipation 06/06/2020  . 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  . Stroke (cerebrum) ([place]) 05/31/2020  . Cerebral ischemia 05/27/2020  . Left leg weakness 05/27/2020  . Triquetral fracture 05/27/2020  . Acute CVA (cerebrovascular accident) ([place]) 05/26/2020  . Left leg numbness 05/26/2020  . GERD (gastroesophageal reflux disease) 05/26/2020  . 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 Posterior vitreous detachment of both eyes 04/29/2020  . 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 Left epiretinal membrane 04/29/2020  . Pain in right knee 05/26/2018  . Bilateral lower abdominal pain 10/21/2010  . History of diverticulitis of colon 10/21/2010  . History of Clostridium difficile infection 10/21/2010  Collier Salina, OTR/L 06/19/2020, 3:53 PM  [facility] [address] [place], [place], [address] Phone: [phone]   Fax:  [phone]  Name: [name] MRN: [id number] Date of Birth: 03-19-1941

## 2020-06-20 ENCOUNTER — Encounter: Payer: Self-pay | Admitting: Physical Medicine & Rehabilitation

## 2020-06-20 ENCOUNTER — Encounter: Payer: Medicare HMO | Attending: Physical Medicine & Rehabilitation | Admitting: Physical Medicine & Rehabilitation

## 2020-06-20 ENCOUNTER — Other Ambulatory Visit: Payer: Self-pay

## 2020-06-20 VITALS — BP 145/72 | HR 79 | Temp 98.4°F | Ht 64.0 in | Wt 153.0 lb

## 2020-06-20 DIAGNOSIS — Z8673 Personal history of transient ischemic attack (TIA), and cerebral infarction without residual deficits: Secondary | ICD-10-CM | POA: Diagnosis not present

## 2020-06-20 DIAGNOSIS — R269 Unspecified abnormalities of gait and mobility: Secondary | ICD-10-CM | POA: Insufficient documentation

## 2020-06-20 DIAGNOSIS — S62112A Displaced fracture of triquetrum [cuneiform] bone, left wrist, initial encounter for closed fracture: Secondary | ICD-10-CM

## 2020-06-20 DIAGNOSIS — S62115S Nondisplaced fracture of triquetrum [cuneiform] bone, left wrist, sequela: Secondary | ICD-10-CM | POA: Insufficient documentation

## 2020-06-20 DIAGNOSIS — I69398 Other sequelae of cerebral infarction: Secondary | ICD-10-CM | POA: Diagnosis not present

## 2020-06-20 NOTE — Progress Notes (Signed)
Subjective:    Patient ID: [name], female    DOB: [birth year], 80 y.o.   MRN: [id number] 79 y.o. female with history of renal cancer, peripheral neuropathy, left hip fracture with left leg length discrepancy, chronic pain who was admitted on 05/26/2020 after 2 to 3 days of progressive numbness in LLE resulting in fall with inability to walk as well as left wrist fracture.  LLE weakness resolved in ED and patient did not receive TPA.  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  She did have recurrence and LLE weakness and stroke felt to be embolic due to unknown source.   Loop recorder was placed to rule out A. fib and Dr. Erlinda Hong recommended DAPT x3 months followed by aspirin alone.  Left wrist splint placed to manage fracture peripheral avulsion fracture with recommendations of NWB.  Chronic right shoulder pain treated with cortisone injection and improvement in pain.  She continues to be limited by pain neck pain, left-sided weakness with numbness as well as nonweightbearing on left wrist.  Therapy ongoing and CIR was recommended due to functional decline.   Admit date: 05/31/2020 Discharge date: 06/06/2020  HPI Patient is back at home.  She is ambulating with a rolling walker.  She is dressing and bathing herself.  She has started outpatient PT and OT.  She states she has appointment scheduled for at least 6 weeks but does not feel like she will need that many visits.  She enjoyed the holidays with her family.  She denies having any falls at home. The patient had multiple questions regarding potential for stroke recurrence.  She also wanted to discuss her diet as well as her medications.  We discussed her lipid profile as well as intracranial stenosis.  We discussed that neurology would make the determination when to stop her Plavix.  We also discussed that  cardiology will be following the recordings from her loop device and making further recommendations. Pain Inventory Average Pain 5 Pain Right Now 5 My pain is dull  LOCATION OF PAIN  Wrist, leg  BOWEL Number of stools per week: daily Oral laxative use Yes  Type of laxative miralax/stool softner Enema or suppository use No  History of colostomy No  Incontinent No   BLADDER Normal In and out cath, frequency na Able to self cath na Bladder incontinence No  Frequent urination No  Leakage with coughing No  Difficulty starting stream No  Incomplete bladder emptying No    Mobility use a walker ability to climb steps?  yes do you drive?  no  Function retired I need assistance with the following:  shopping  Neuro/Psych trouble walking  Prior Studies TC appt  Physicians involved in your care TC appt   Family History  Problem Relation Age of Onset  . Colon cancer Mother   . Coronary artery disease Father   . Breast cancer Neg Hx    Social History   Socioeconomic History  . Marital status: Married    Spouse name: Not on file  . Number of children: Not on file  . Years of education: Not on file  . Highest education level: Not on file  Occupational History  . Not on file  Tobacco Use  . Smoking status: Never Smoker  . Smokeless tobacco: Never Used  Substance and Sexual Activity  . Alcohol use: No  . Drug  use: No  . Sexual activity: Not on file  Other Topics Concern  . Not on file  Social History Narrative  . Not on file   Social Determinants of Health   Financial Resource Strain: Not on file  Food Insecurity: Not on file  Transportation Needs: Not on file  Physical Activity: Not on file  Stress: Not on file  Social Connections: Not on file   Past Surgical History:  Procedure Laterality Date  . ABDOMINAL HYSTERECTOMY    . COLON SURGERY     Severe sigmoid diverticulitis  . COLONOSCOPY    . FOOT SURGERY    . HIP SURGERY    . Kidney cancer      Kidney removed  . LOOP RECORDER INSERTION N/A 05/30/2020   Procedure: LOOP RECORDER INSERTION;  Surgeon: Vickie Epley, MD;  Location: [facility];  Service: Cardiovascular;  Laterality: N/A;  . POLYPECTOMY     Past Medical History:  Diagnosis Date  . Blood transfusion without reported diagnosis    with hip fracture-80 years old  . Cancer [facility]) 1983   Kidney Cancer  . Diverticulosis   . GERD (gastroesophageal reflux disease)   . Personal history of kidney cancer    BP (!) 145/72   Pulse 79   Temp 98.4 F (36.9 C)   Ht 5\' 4"  (1.626 m)   Wt 153 lb (69.4 kg)   SpO2 94%   BMI 26.26 kg/m   Opioid Risk Score:   Fall Risk Score:  `1  Depression screen PHQ 2/9  No flowsheet data found. Review of Systems     Objective:   Physical Exam Vitals and nursing note reviewed.  Constitutional:      General: She is not in acute distress.    Appearance: She is normal weight.  HENT:     Head: Normocephalic and atraumatic.  Eyes:     Extraocular Movements: Extraocular movements intact.     Conjunctiva/sclera: Conjunctivae normal.     Pupils: Pupils are equal, round, and reactive to light.  Cardiovascular:     Rate and Rhythm: Normal rate and regular rhythm.     Heart sounds: Normal heart sounds.  Pulmonary:     Effort: Pulmonary effort is normal. No respiratory distress.     Breath sounds: Normal breath sounds.  Abdominal:     General: Abdomen is flat. There is no distension.     Palpations: Abdomen is soft.     Tenderness: There is no abdominal tenderness.  Musculoskeletal:        General: Normal range of motion.     Cervical back: Normal range of motion.     Comments: 3 cm leg length discrepancy left leg shorter than right leg with built up shoe platform on the left side No tenderness palpation lumbar area no pain with overhead shoulder range of motion  Skin:    General: Skin is warm and dry.  Neurological:     Mental Status: She is alert and oriented to  person, place, and time.     Cranial Nerves: Cranial nerves are intact.     Sensory: Sensation is intact.     Motor: Motor function is intact. No tremor or abnormal muscle tone.     Coordination: Coordination is intact. Rapid alternating movements normal.     Gait: Gait abnormal.     Comments: Patient ambulates without her platform walker use and widened base of support with small step length and slow cadence.  She  only goes a few steps. Motor strength is 5/5 bilateral deltoid bicep tricep grip hip flexor knee extensor ankle dorsiflexor Standing balance is fair Sensation intact to light touch bilateral upper and lower limbs           Assessment & Plan:  #1.  Right ACA distribution infarct, left hemiparesis has largely improved, main issue now is balance/gait disorder.  Continue outpatient PT Continue platform walker for now, once she is allowed to weight-bear through the left wrist, she can switch to a regular walker.  I think she could likely be upgraded to a quad cane in the next month or so. 2.  Left wrist fracture follow-up with orthopedics Dr. Alma Friendly who will determine weightbearing Physical medicine rehab follow-up in 1 month

## 2020-06-20 NOTE — Patient Instructions (Signed)
DASH Eating Plan DASH stands for "Dietary Approaches to Stop Hypertension." The DASH eating plan is a healthy eating plan that has been shown to reduce high blood pressure (hypertension). It may also reduce your risk for type 2 diabetes, heart disease, and stroke. The DASH eating plan may also help with weight loss. What are tips for following this plan?  General guidelines  Avoid eating more than 2,300 mg (milligrams) of salt (sodium) a day. If you have hypertension, you may need to reduce your sodium intake to 1,500 mg a day.  Limit alcohol intake to no more than 1 drink a day for nonpregnant women and 2 drinks a day for men. One drink equals 12 oz of beer, 5 oz of wine, or 1 oz of hard liquor.  Work with your health care provider to maintain a healthy body weight or to lose weight. Ask what an ideal weight is for you.  Get at least 30 minutes of exercise that causes your heart to beat faster (aerobic exercise) most days of the week. Activities may include walking, swimming, or biking.  Work with your health care provider or diet and nutrition specialist (dietitian) to adjust your eating plan to your individual calorie needs. Reading food labels   Check food labels for the amount of sodium per serving. Choose foods with less than 5 percent of the Daily Value of sodium. Generally, foods with less than 300 mg of sodium per serving fit into this eating plan.  To find whole grains, look for the word "whole" as the first word in the ingredient list. Shopping  Buy products labeled as "low-sodium" or "no salt added."  Buy fresh foods. Avoid canned foods and premade or frozen meals. Cooking  Avoid adding salt when cooking. Use salt-free seasonings or herbs instead of table salt or sea salt. Check with your health care provider or pharmacist before using salt substitutes.  Do not fry foods. Cook foods using healthy methods such as baking, boiling, grilling, and broiling instead.  Cook with  heart-healthy oils, such as olive, canola, soybean, or sunflower oil. Meal planning  Eat a balanced diet that includes: ? 5 or more servings of fruits and vegetables each day. At each meal, try to fill half of your plate with fruits and vegetables. ? Up to 6-8 servings of whole grains each day. ? Less than 6 oz of lean meat, poultry, or fish each day. A 3-oz serving of meat is about the same size as a deck of cards. One egg equals 1 oz. ? 2 servings of low-fat dairy each day. ? A serving of nuts, seeds, or beans 5 times each week. ? Heart-healthy fats. Healthy fats called Omega-3 fatty acids are found in foods such as flaxseeds and coldwater fish, like sardines, salmon, and mackerel.  Limit how much you eat of the following: ? Canned or prepackaged foods. ? Food that is high in trans fat, such as fried foods. ? Food that is high in saturated fat, such as fatty meat. ? Sweets, desserts, sugary drinks, and other foods with added sugar. ? Full-fat dairy products.  Do not salt foods before eating.  Try to eat at least 2 vegetarian meals each week.  Eat more home-cooked food and less restaurant, buffet, and fast food.  When eating at a restaurant, ask that your food be prepared with less salt or no salt, if possible. What foods are recommended? The items listed may not be a complete list. Talk with your dietitian about   what dietary choices are best for you. Grains Whole-grain or whole-wheat bread. Whole-grain or whole-wheat pasta. Brown rice. Oatmeal. Quinoa. Bulgur. Whole-grain and low-sodium cereals. Pita bread. Low-fat, low-sodium crackers. Whole-wheat flour tortillas. Vegetables Fresh or frozen vegetables (raw, steamed, roasted, or grilled). Low-sodium or reduced-sodium tomato and vegetable juice. Low-sodium or reduced-sodium tomato sauce and tomato paste. Low-sodium or reduced-sodium canned vegetables. Fruits All fresh, dried, or frozen fruit. Canned fruit in natural juice (without  added sugar). Meat and other protein foods Skinless chicken or turkey. Ground chicken or turkey. Pork with fat trimmed off. Fish and seafood. Egg whites. Dried beans, peas, or lentils. Unsalted nuts, nut butters, and seeds. Unsalted canned beans. Lean cuts of beef with fat trimmed off. Low-sodium, lean deli meat. Dairy Low-fat (1%) or fat-free (skim) milk. Fat-free, low-fat, or reduced-fat cheeses. Nonfat, low-sodium ricotta or cottage cheese. Low-fat or nonfat yogurt. Low-fat, low-sodium cheese. Fats and oils Soft margarine without trans fats. Vegetable oil. Low-fat, reduced-fat, or light mayonnaise and salad dressings (reduced-sodium). Canola, safflower, olive, soybean, and sunflower oils. Avocado. Seasoning and other foods Herbs. Spices. Seasoning mixes without salt. Unsalted popcorn and pretzels. Fat-free sweets. What foods are not recommended? The items listed may not be a complete list. Talk with your dietitian about what dietary choices are best for you. Grains Baked goods made with fat, such as croissants, muffins, or some breads. Dry pasta or rice meal packs. Vegetables Creamed or fried vegetables. Vegetables in a cheese sauce. Regular canned vegetables (not low-sodium or reduced-sodium). Regular canned tomato sauce and paste (not low-sodium or reduced-sodium). Regular tomato and vegetable juice (not low-sodium or reduced-sodium). Pickles. Olives. Fruits Canned fruit in a light or heavy syrup. Fried fruit. Fruit in cream or butter sauce. Meat and other protein foods Fatty cuts of meat. Ribs. Fried meat. Bacon. Sausage. Bologna and other processed lunch meats. Salami. Fatback. Hotdogs. Bratwurst. Salted nuts and seeds. Canned beans with added salt. Canned or smoked fish. Whole eggs or egg yolks. Chicken or turkey with skin. Dairy Whole or 2% milk, cream, and half-and-half. Whole or full-fat cream cheese. Whole-fat or sweetened yogurt. Full-fat cheese. Nondairy creamers. Whipped toppings.  Processed cheese and cheese spreads. Fats and oils Butter. Stick margarine. Lard. Shortening. Ghee. Bacon fat. Tropical oils, such as coconut, palm kernel, or palm oil. Seasoning and other foods Salted popcorn and pretzels. Onion salt, garlic salt, seasoned salt, table salt, and sea salt. Worcestershire sauce. Tartar sauce. Barbecue sauce. Teriyaki sauce. Soy sauce, including reduced-sodium. Steak sauce. Canned and packaged gravies. Fish sauce. Oyster sauce. Cocktail sauce. Horseradish that you find on the shelf. Ketchup. Mustard. Meat flavorings and tenderizers. Bouillon cubes. Hot sauce and Tabasco sauce. Premade or packaged marinades. Premade or packaged taco seasonings. Relishes. Regular salad dressings. Where to find more information:  National Heart, Lung, and Blood Institute: www.nhlbi.nih.gov  American Heart Association: www.heart.org Summary  The DASH eating plan is a healthy eating plan that has been shown to reduce high blood pressure (hypertension). It may also reduce your risk for type 2 diabetes, heart disease, and stroke.  With the DASH eating plan, you should limit salt (sodium) intake to 2,300 mg a day. If you have hypertension, you may need to reduce your sodium intake to 1,500 mg a day.  When on the DASH eating plan, aim to eat more fresh fruits and vegetables, whole grains, lean proteins, low-fat dairy, and heart-healthy fats.  Work with your health care provider or diet and nutrition specialist (dietitian) to adjust your eating plan to your   individual calorie needs. This information is not intended to replace advice given to you by your health care provider. Make sure you discuss any questions you have with your health care provider. Document Revised: 05/13/2017 Document Reviewed: 05/24/2016 Elsevier Patient Education  2020 Elsevier Inc.  

## 2020-06-24 ENCOUNTER — Telehealth: Payer: Self-pay

## 2020-06-24 NOTE — Telephone Encounter (Signed)
I answered the patient questions to the best of my abilities.

## 2020-06-25 ENCOUNTER — Encounter: Payer: Self-pay | Admitting: Occupational Therapy

## 2020-06-25 ENCOUNTER — Ambulatory Visit: Payer: Medicare HMO | Admitting: Rehabilitation

## 2020-06-25 ENCOUNTER — Other Ambulatory Visit: Payer: Self-pay

## 2020-06-25 ENCOUNTER — Encounter: Payer: Self-pay | Admitting: Rehabilitation

## 2020-06-25 ENCOUNTER — Ambulatory Visit: Payer: Medicare HMO | Admitting: Occupational Therapy

## 2020-06-25 DIAGNOSIS — I69354 Hemiplegia and hemiparesis following cerebral infarction affecting left non-dominant side: Secondary | ICD-10-CM

## 2020-06-25 DIAGNOSIS — R208 Other disturbances of skin sensation: Secondary | ICD-10-CM

## 2020-06-25 DIAGNOSIS — M6281 Muscle weakness (generalized): Secondary | ICD-10-CM

## 2020-06-25 DIAGNOSIS — R293 Abnormal posture: Secondary | ICD-10-CM

## 2020-06-25 DIAGNOSIS — M79642 Pain in left hand: Secondary | ICD-10-CM

## 2020-06-25 DIAGNOSIS — R2681 Unsteadiness on feet: Secondary | ICD-10-CM | POA: Diagnosis not present

## 2020-06-25 DIAGNOSIS — R262 Difficulty in walking, not elsewhere classified: Secondary | ICD-10-CM

## 2020-06-25 NOTE — Patient Instructions (Signed)
11. Grip Strengthening (Resistive Putty)   Squeeze putty using thumb and all fingers. Repeat 15 times. Do 1-2 sessions per day.   Extension (Assistive Putty)   Roll putty back and forth, being sure to use all fingertips. Repeat 3 times. Do 1-2 sessions per day.  Then pinch as below.   Palmar Pinch Strengthening (Resistive Putty)   Pinch putty between thumb and each fingertip in turn after rolling out    Finger and Thumb Extension (Resistive Putty)   With thumb and all fingers in center of putty donut, stretch out. Repeat 5-10 times. Do 1-2 sessions per day.      IP Fisting (Resistive Putty)   Keeping knuckles straight, bend fingertips to squeeze putty. Repeat 10 times. Do 1-2 sessions per day.  MP Flexion (Resistive Putty)   Bending only at large knuckles, press putty down against thumb. Keep fingertips straight. Repeat 10 times. Do 1-2 sessions per day.   Lateral Pinch Strengthening (Resistive Putty)    Squeeze between thumb and side of each finger in turn. Repeat 10 times. Do 1-2 sessions per day.

## 2020-06-25 NOTE — Therapy (Signed)
[facility] [address] [place], [place], [address] Phone: [phone]   Fax:  [phone]  Occupational Therapy Treatment  Patient Details  Name: [name] MRN: [id number] Date of Birth: March 07, 1941 Referring Provider (OT): Algis Liming   Encounter Date: 06/25/2020   OT End of Session - 06/25/20 1612    Visit Number 2    Number of Visits 9    Date for OT Re-Evaluation 09/02/20    OT Start Time 1779    OT Stop Time 1530    OT Time Calculation (min) 45 min    Activity Tolerance Patient tolerated treatment well    Behavior During Therapy [facility] for tasks assessed/performed           Past Medical History:  Diagnosis Date  . Blood transfusion without reported diagnosis    with hip fracture-80 years old  . Cancer [facility]) 1983   Kidney Cancer  . Diverticulosis   . GERD (gastroesophageal reflux disease)   . Personal history of kidney cancer     Past Surgical History:  Procedure Laterality Date  . ABDOMINAL HYSTERECTOMY    . COLON SURGERY     Severe sigmoid diverticulitis  . COLONOSCOPY    . FOOT SURGERY    . HIP SURGERY    . Kidney cancer     Kidney removed  . LOOP RECORDER INSERTION N/A 05/30/2020   Procedure: LOOP RECORDER INSERTION;  Surgeon: Vickie Epley, MD;  Location: [facility];  Service: Cardiovascular;  Laterality: N/A;  . POLYPECTOMY      There were no vitals filed for this visit.   Subjective Assessment - 06/25/20 1551    Subjective  Patient indicates that she has had mild wrist pain periodically- mid wrist.  Patient indicates she showered without assistance x 2.    Pertinent History Kidney cancer - removed right kidney    Currently in Pain? No/denies    Pain Score 0-No pain                        OT Treatments/Exercises (OP) - 06/25/20 1553      ADLs   ADL Comments Was reviewing goals with patient, and she shared that she was able to safely shower x 2 since OT  eval.      Exercises   Exercises Theraputty      Cognitive Exercises   Other Cognitive Exercises 1 Assessed visual scanning and organization skills as patient has hope to return to driving.  Patient without any visual scanning tabletop, or cognitive impairments depicted this session.  Patient reports enjoying reading and she has been reading for pleasure and devotions recently without any problem.      Theraputty   Theraputty - Roll yellow    Theraputty - Grip yellow    Theraputty - Pinch Yellow   worked slow movement without any report of pain                 OT Education - 06/25/20 1612    Education Details HEP Putty - yellow    Person(s) Educated Patient    Methods Explanation;Handout;Demonstration    Comprehension Verbalized understanding;Returned demonstration               OT Long Term Goals - 06/25/20 1615      OT LONG TERM GOAL #1   Title Patient will complete HEP designed to improve hand strength due 08/03/20    Time  4    Period Weeks    Status On-going      OT LONG TERM GOAL #2   Title Patient will shower with modified independence    Status Achieved      OT LONG TERM GOAL #3   Title Patient will demonstrate 3 lb increase in left grip strength to improve functional grasp with IADL    Time 4    Period Weeks    Status On-going      OT LONG TERM GOAL #4   Status On-going                 Plan - 06/25/20 1612    Clinical Impression Statement Patient is showing improvement toward OT goals.    OT Occupational Profile and History Problem Focused Assessment - Including review of records relating to presenting problem    Occupational performance deficits (Please refer to evaluation for details): ADL's;IADL's    Body Structure / Function / Physical Skills ADL;Endurance;UE functional use;Balance;Pain;IADL;Flexibility;Body mechanics;FMC;Mobility    Rehab Potential Excellent    Clinical Decision Making Limited treatment options, no task modification  necessary    Comorbidities Affecting Occupational Performance: May have comorbidities impacting occupational performance    Modification or Assistance to Complete Evaluation  No modification of tasks or assist necessary to complete eval    OT Frequency 2x / week    OT Duration 4 weeks    OT Treatment/Interventions Self-care/ADL training;Therapeutic exercise;Moist Heat;Neuromuscular education;Splinting;Patient/family education;Therapeutic activities;Balance training;Functional Mobility Training;Fluidtherapy;Cryotherapy;Ultrasound;DME and/or AE instruction;Manual Therapy    Plan dynamic stand balance    Consulted and Agree with Plan of Care Patient           Patient will benefit from skilled therapeutic intervention in order to improve the following deficits and impairments:   Body Structure / Function / Physical Skills: ADL,Endurance,UE functional use,Balance,Pain,IADL,Flexibility,Body mechanics,FMC,Mobility       Visit Diagnosis: Hemiplegia and hemiparesis following cerebral infarction affecting left non-dominant side (HCC)  Other disturbances of skin sensation  Unsteadiness on feet  Abnormal posture  Muscle weakness (generalized)  Pain in left hand    Problem List Patient Active Problem List   Diagnosis Date Noted  . Gait disturbance, post-stroke 06/20/2020  . Chronic neck pain 06/06/2020  . Constipation 06/06/2020  . 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  . 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 Cerebral ischemia 05/27/2020  . Left leg weakness 05/27/2020  . Triquetral fracture 05/27/2020  . Acute CVA (cerebrovascular accident) ([place]) 05/26/2020  . Left leg numbness 05/26/2020  . GERD (gastroesophageal reflux disease) 05/26/2020  . 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 Posterior vitreous detachment of both eyes 04/29/2020  . 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 Left epiretinal  membrane 04/29/2020  . Pain in right knee 05/26/2018  . Bilateral lower abdominal pain 10/21/2010  . History of diverticulitis of colon 10/21/2010  . History of Clostridium difficile infection 10/21/2010    Mariah Milling, OTR/L 06/25/2020, 4:16 PM  [facility] [address] [place], [place], [address] Phone: [phone]   Fax:  [phone]  Name: [name] MRN: [id number] Date of Birth: 03/16/1941

## 2020-06-25 NOTE — Patient Instructions (Signed)
Access Code: [email or web address] URL: [email or web address] Date: 06/25/2020 Prepared by: Cameron Sprang  Exercises Sit to Stand without Arm Support - 1 x daily - 5-7 x weekly - 1 sets - 10 reps Standing Marching - 1 x daily - 5-7 x weekly - 1 sets - 10 reps Tandem Stance with Support - 1 x daily - 5-7 x weekly - 1 sets - 3 reps - 15 secs hold Standing Hip Abduction with Counter Support - 1 x daily - 5-7 x weekly - 1 sets - 10 reps Romberg Stance with Eyes Closed - 1 x daily - 5-7 x weekly - 1 sets - 3 reps - 20 secs hold Wide Stance with Eyes Closed on Foam Pad - 1 x daily - 7 x weekly - 3 sets - 10 reps

## 2020-06-25 NOTE — Therapy (Signed)
[facility] [address] [place], [place], [address] Phone: [phone]   Fax:  [phone]  Physical Therapy Treatment  Patient Details  Name: [name] MRN: [id number] Date of Birth: [birth year] Referring Provider (PT): Letta Pate Luanna Salk, MD   Encounter Date: 06/25/2020   PT End of Session - 06/25/20 1507    Visit Number 2    Number of Visits 13    Date for PT Re-Evaluation 08/03/20    Authorization Type [employer]    PT Start Time 1400    PT Stop Time 1445    PT Time Calculation (min) 45 min    Activity Tolerance Patient tolerated treatment well    Behavior During Therapy [facility] for tasks assessed/performed           Past Medical History:  Diagnosis Date  . Blood transfusion without reported diagnosis    with hip fracture-80 years old  . Cancer [facility]) 1983   Kidney Cancer  . Diverticulosis   . GERD (gastroesophageal reflux disease)   . Personal history of kidney cancer     Past Surgical History:  Procedure Laterality Date  . ABDOMINAL HYSTERECTOMY    . COLON SURGERY     Severe sigmoid diverticulitis  . COLONOSCOPY    . FOOT SURGERY    . HIP SURGERY    . Kidney cancer     Kidney removed  . LOOP RECORDER INSERTION N/A 05/30/2020   Procedure: LOOP RECORDER INSERTION;  Surgeon: Vickie Epley, MD;  Location: [facility];  Service: Cardiovascular;  Laterality: N/A;  . POLYPECTOMY      There were no vitals filed for this visit.   Subjective Assessment - 06/25/20 1401    Subjective Pt reports usual pain in low back when walking today.    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 Yes    Pain Score 5     Pain Location Back    Pain Orientation Lower;Medial    Pain Descriptors / Indicators Aching;Throbbing    Pain Type Chronic pain    Pain Onset More than a month ago    Pain Frequency Intermittent    Aggravating Factors  walking long periods, just has good days and bad    Pain Relieving Factors Tylenol, heat occassionally.                             [place] Adult PT Treatment/Exercise - 06/25/20 1458      Transfers   Transfers Sit to Stand;Stand to Sit    Sit to Stand 5: Supervision    Sit to Stand Details Verbal cues for sequencing;Verbal cues for technique;Verbal cues for precautions/safety    Stand to Sit 5: Supervision    Comments Performed x 10 reps for BLE strengthening.  Performed from mat without UE support with cues for reaching arms forward for increased forward weight shift.  Added to HEP      Ambulation/Gait   Ambulation/Gait Yes    Ambulation/Gait Assistance 6: Modified independent (Device/Increase time);4: Min guard;4: Min assist  Ambulation/Gait Assistance Details Pt mod I walking into clinic with L PFRW and during session to/from counter top.  Did have pt ambulate without AD x 2' with min A (R HHA).  Note that LLD plays big part of her unsteady gait and also increasing back pain.  Therefore trialed use of quad tip cane in RUE x 115' with min/guard A with cues for sequencing and technique.  Do not feel that pt is ready to do this at home, but would benefit from more practice in clinic.  Pt verbalized understanding.    Ambulation Distance (Feet) --   see above   Assistive device Left platform walker;Straight cane   with quad tip   Gait Pattern Step-through pattern;Decreased step length - right;Decreased step length - left;Decreased stride length;Trunk flexed    Ambulation Surface Level;Indoor      Self-Care   Self-Care Other Self-Care Comments    Other  Self-Care Comments  Discussed that RLE still seems to be a bit longer than L despite shoe lift.  This seems to be causing increased low back pain.  She reports that they haven't measured LLD in a while.  Discussed that we could measure here in clinic and document so that she may take to MD or next time she has shoes made.  Pt verbalized understanding.      Neuro Re-ed    Neuro Re-ed Details  Performed standing marching x 10 reps with light UE support, tandem stance x 15 secs (2 reps each side) with intermittent UE support, and corner balance tasks: solid ground feet together EC x 20 secs with mild to moderate sway (improved within session)>feet apart on pillows with EO x 20 secs without sway>feet apart EC on pillow x 2 sets of 20 secs with min cues for improved ankle and hip strategy.  Added to HEP.      Exercises   Exercises Other Exercises    Other Exercises  sit<>stand as above, standing hip abd x 10 reps at counter top with min cues for maintaining hip alignment.  Added to HEP            Access Code: [email or web address] URL: [email or web address] Date: 06/25/2020 Prepared by: Cameron Sprang  Exercises Sit to Stand without Arm Support - 1 x daily - 5-7 x weekly - 1 sets - 10 reps Standing Marching - 1 x daily - 5-7 x weekly - 1 sets - 10 reps Tandem Stance with Support - 1 x daily - 5-7 x weekly - 1 sets - 3 reps - 15 secs hold Standing Hip Abduction with Counter Support - 1 x daily - 5-7 x weekly - 1 sets - 10 reps Romberg Stance with Eyes Closed - 1 x daily - 5-7 x weekly - 1 sets - 3 reps - 20 secs hold Wide Stance with Eyes Closed on Foam Pad - 1 x daily - 7 x weekly - 3 sets - 10 reps       PT Education - 06/25/20 1506    Education Details Initial HEP    Person(s) Educated Patient    Methods Explanation;Demonstration;Handout    Comprehension Verbalized understanding;Returned demonstration;Need further instruction            PT Short Term Goals - 06/19/20 1713       PT SHORT TERM GOAL #1   Title Pt will initiate strengthening and balance HEP    Time 3    Period Weeks    Status New    Target Date  07/10/20      PT SHORT TERM GOAL #2   Title Pt will perform five time sit to stand from chair without use of RUE, safely in </= 14 seconds    Baseline 16.22, RUE support, uncontrolled stand > sit    Time 3    Period Weeks    Status New    Target Date 07/10/20      PT SHORT TERM GOAL #3   Title Pt will increase gait velocity to 2.2 ft/sec without AD    Baseline 1.8 ft/sec without AD    Time 3    Period Weeks    Status New    Target Date 07/10/20      PT SHORT TERM GOAL #4   Title Pt will increase DGI by 3 points to indicate decreased falls risk    Baseline 14/24    Time 3    Period Weeks    Status New    Target Date 07/10/20             PT Long Term Goals - 06/19/20 1716      PT LONG TERM GOAL #1   Title Pt will report improvement in overall UE function on FOTO to 78% by D/C from PT/OT    Baseline 66%    Time 6    Period Weeks    Status New    Target Date 08/03/20      PT LONG TERM GOAL #2   Title Pt will improve balance and functional strength to perform 5 time sit to stand without use of UE to </= 13 seconds    Time 6    Period Weeks    Status New    Target Date 08/03/20      PT LONG TERM GOAL #3   Title Pt will increase gait velocity without AD to >/= 2.62 ft/sec    Time 6    Period Weeks    Status New    Target Date 08/03/20      PT LONG TERM GOAL #4   Title Pt will increase DGI to >/= 19/24 to indicate low falls risk with ambulation    Time 6    Period Weeks    Status New    Target Date 08/03/20      PT LONG TERM GOAL #5   Title Pt will ambulate x 500' over indoor and paved outdoor surfaces MOD I without AD and without LOB    Time 6    Period Weeks    Status New    Target Date 08/03/20                 Plan - 06/25/20 1507    Clinical Impression Statement Skilled session focused on setting up initial  HEP for BLE strength and balance, see pt instructions.  Also worked on gait without device and trialed use of quad tip cane which she liked and may be a good progression from walker to no AD. Will continue to work towards this in clinic.    Personal Factors and Comorbidities Age;Comorbidity 3+    Comorbidities Blood transfusion, Kidney CA, Diverticulitis, foot surgery, hip surgery, loop recorder insertion, wrist fracture due to fall    Examination-Activity Limitations Bend;Locomotion Level;Stairs;Stand;Transfers    Examination-Participation Restrictions Cleaning;Driving;Community Activity;Laundry;Meal Prep;Shop    Stability/Clinical Decision Making Evolving/Moderate complexity    Rehab Potential Good    PT Frequency 2x / week    PT Duration 6 weeks    PT  Treatment/Interventions ADLs/Self Care Home Management;DME Instruction;Gait training;Stair training;Functional mobility training;Therapeutic activities;Therapeutic exercise;Balance training;Neuromuscular re-education;Patient/family education    PT Next Visit Plan gait training without platform RW/with quad tip cane, check/measure leg length, continue balance exercises on compliant surfaces, EO/EC, perhaps give stretches for low back/core strength??    PT Home Exercise Plan [email or web address]    Consulted and Agree with Plan of Care Patient           Patient will benefit from skilled therapeutic intervention in order to improve the following deficits and impairments:  Decreased balance,Decreased strength,Difficulty walking,Impaired sensation,Postural dysfunction  Visit Diagnosis: Hemiplegia and hemiparesis following cerebral infarction affecting left non-dominant side (HCC)  Other disturbances of skin sensation  Unsteadiness on feet  Difficulty in walking, not elsewhere classified  Abnormal posture     Problem List Patient Active Problem List   Diagnosis Date Noted  . Gait disturbance, post-stroke 06/20/2020  . Chronic neck pain 06/06/2020   . Constipation 06/06/2020  . 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  . 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 Cerebral ischemia 05/27/2020  . Left leg weakness 05/27/2020  . Triquetral fracture 05/27/2020  . Acute CVA (cerebrovascular accident) ([place]) 05/26/2020  . Left leg numbness 05/26/2020  . GERD (gastroesophageal reflux disease) 05/26/2020  . 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 Posterior vitreous detachment of both eyes 04/29/2020  . 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 Left epiretinal membrane 04/29/2020  . Pain in right knee 05/26/2018  . Bilateral lower abdominal pain 10/21/2010  . History of diverticulitis of colon 10/21/2010  . History of Clostridium difficile infection 10/21/2010    Cameron Sprang, PT, MPT [facility] [address] [place], [place], [address] Phone: [phone]   Fax:  [phone] 06/25/20, 3:13 PM  Name: [name] MRN: [id number] Date of Birth: 1940/09/14

## 2020-07-02 ENCOUNTER — Ambulatory Visit: Payer: Medicare HMO | Admitting: Rehabilitation

## 2020-07-02 ENCOUNTER — Ambulatory Visit: Payer: Medicare HMO | Admitting: Occupational Therapy

## 2020-07-04 ENCOUNTER — Ambulatory Visit: Payer: Medicare HMO | Admitting: Physical Therapy

## 2020-07-06 LAB — CUP PACEART REMOTE DEVICE CHECK
Date Time Interrogation Session: 20220122125211
Implantable Pulse Generator Implant Date: 20211217

## 2020-07-07 ENCOUNTER — Ambulatory Visit (INDEPENDENT_AMBULATORY_CARE_PROVIDER_SITE_OTHER): Payer: Medicare HMO

## 2020-07-07 DIAGNOSIS — I63031 Cerebral infarction due to thrombosis of right carotid artery: Secondary | ICD-10-CM

## 2020-07-07 NOTE — Progress Notes (Signed)
[facility] [address] [place]. [place] [address] [phone]       HOSPITAL FOLLOW UP NOTE  [name] Date of Birth:  1940/09/19 Medical Record Number:  [id number]   Reason for Referral:  hospital stroke follow up    SUBJECTIVE:   CHIEF COMPLAINT:  Chief Complaint  Patient presents with  . Follow-up    Treatment room alone Pt states she is doing good overall, have headaches more at night     HPI:   [name] is a 80 y.o. female with history of kidney cancer (nephrectomy), previous stroke by imaging, diverticulosis, idiopathic peripheral neuropathy, and hip fracture at the age of 60 from [place] (left leg now shorter than right) presented to [facility] ED on 05/26/2020 with several day hx of LLE numbness / weakness resulting in a fall with left wrist fracture. Personally reviewed hospitalization pertinent progress notes, lab work and imaging with summary provided.  Evaluated by Dr. Erlinda Hong with stroke work-up revealing multifocal acute ischemia within the right ACA territory with right A2 ACA occlusion, embolic secondary to unknown source.  2D echo normal EF without cardiac source of embolus identified and LE Doppler negative for DVT.  Recommended loop recorder placement to rule out A. fib as potential etiology.  Recommended DAPT for 3 months then aspirin alone given right ICA large vessel occlusion.  LDL 143 and initiate atorvastatin 40 mg daily.  No prior history of HTN or DM with A1c 5.8.  Other stroke risk factors include advanced age and history of stroke on imaging.  Other active problems include left wrist fracture s/p fall.  Evaluated by therapies and discharged to CIR on 05/31/2020 as recommended for ongoing therapy needs.   Stroke: Multifocal acute ischemia within the right ACA territory - embolic - source unclear.  CT head - No CT evidence for acute intracranial abnormality. Chronic right cerebellar infarct. Mild chronic small vessel ischemic change  of the white matter. 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   MRI head -  Multifocal acute ischemia within the right hemisphere, predominantly within the anterior frontal lobe and medial parietal lobe. 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   MRA head - Right A2 ACA occlusion. Areas of infarction on prior study could reflect watershed ischemia. Moderate right supraclinoid ICA stenosis.   MRA Neck - Normal MRA of the neck.   2D Echo - EF 60 - 65%. No cardiac source of emboli identified.   LE venous doppler neg for DVT  Loop recorder placed 05/30/2020  [name] Virus 2 - negative  LDL - 143   HGBA1C - 5.8  VTE prophylaxis - SCDs  No antithrombotic prior to admission, now on aspirin 81 mg daily and clopidogrel 75 mg daily DAPT for 3 months and then ASA alone given right ACA large vessel occlusion.  Patient counseled to be compliant with her antithrombotic medications  Ongoing aggressive stroke risk factor management  Therapy recommendations:  CIR  Disposition:  CIR  Today, 07/08/2020, [name] is being seen for hospital follow-up unaccompanied.  She was discharged home from CIR on 06/06/2020 after a 6-day stay. She reports residual mild left ankle weakness, LLE pain and gait impairment.  Currently working with neuro rehab PT/OT with ongoing improvement.  She does admit to chronic gait impairment due to history of hip fracture at young age resulting in shorter left leg  requiring platform shoe. She does believe her current gait ability is close to baseline with use of a cane for ambulation.  Denies new or worsening stroke/TIA symptoms. She has remained on DAPT with mild bruising but no bleeding. Remains on atorvastatin 40mg  daily without myalgias. Blood pressure today 129/76. Loop recorder has not shown atrial fibrillation thus far. Reports being on estrogen for  postmenopausal hot flashes but discontinued during recent admission.  She has since been having issues with hot flashes and questions other treatment options.  No further concerns at this time.    ROS:   14 system review of systems performed and negative with exception of those listed in HPI  PMH:  Past Medical History:  Diagnosis Date  . Blood transfusion without reported diagnosis    with hip fracture-80 years old  . Cancer [facility]) 1983   Kidney Cancer  . Diverticulosis   . GERD (gastroesophageal reflux disease)   . Personal history of kidney cancer     PSH:  Past Surgical History:  Procedure Laterality Date  . ABDOMINAL HYSTERECTOMY    . COLON SURGERY     Severe sigmoid diverticulitis  . COLONOSCOPY    . FOOT SURGERY    . HIP SURGERY    . Kidney cancer     Kidney removed  . LOOP RECORDER INSERTION N/A 05/30/2020   Procedure: LOOP RECORDER INSERTION;  Surgeon: Vickie Epley, MD;  Location: [facility];  Service: Cardiovascular;  Laterality: N/A;  . POLYPECTOMY      Social History:  Social History   Socioeconomic History  . Marital status: Married    Spouse name: Not on file  . Number of children: Not on file  . Years of education: Not on file  . Highest education level: Not on file  Occupational History  . Not on file  Tobacco Use  . Smoking status: Never Smoker  . Smokeless tobacco: Never Used  Substance and Sexual Activity  . Alcohol use: No  . Drug use: No  . Sexual activity: Not on file  Other Topics Concern  . Not on file  Social History Narrative  . Not on file   Social Determinants of Health   Financial Resource Strain: Not on file  Food Insecurity: Not on file  Transportation Needs: Not on file  Physical Activity: Not on file  Stress: Not on file  Social Connections: Not on file  Intimate Partner Violence: Not on file    Family History:  Family History  Problem Relation Age of Onset  . Colon cancer Mother   . Coronary artery  disease Father   . Breast cancer Neg Hx     Medications:   Current Outpatient Medications on File Prior to Visit  Medication Sig Dispense Refill  . acetaminophen (TYLENOL) 650 MG CR tablet Take 650 mg by mouth daily.    Marland Kitchen aspirin EC 81 MG EC tablet Take 1 tablet (81 mg total) by mouth daily. Swallow whole. 30 tablet 0  . atorvastatin (LIPITOR) 40 MG tablet Take 1 tablet (40 mg total) by mouth daily. 30 tablet 0  . clopidogrel (PLAVIX) 75 MG tablet Take 1 tablet (75 mg total) by mouth daily. 30 tablet 0  . cyclobenzaprine (FLEXERIL) 5 MG tablet Take 1 tablet (5 mg total) by mouth 3 (three) times daily as needed for muscle spasms. 30 tablet 0  . lidocaine (LIDODERM) 5 % Place 2 patches onto the skin daily. On for 12 hours and has to be  off for 12 hrs. Purchase this over the counter. 30 patch 0  . MAGNESIUM PO Take 1 tablet by mouth every evening.    . Multiple Vitamins-Minerals (MULTIVITAMIN WITH MINERALS) tablet Take 1 tablet by mouth daily.    . pantoprazole (PROTONIX) 40 MG tablet Take 1 tablet (40 mg total) by mouth daily. 30 tablet 0  . polyethylene glycol (MIRALAX / GLYCOLAX) 17 g packet Take 17 g by mouth daily. 30 each 0  . senna-docusate (SENOKOT-S) 8.6-50 MG tablet Take 1 tablet by mouth 2 (two) times daily. 30 tablet 0  . vitamin C (ASCORBIC ACID) 500 MG tablet Take 500 mg by mouth daily.     No current facility-administered medications on file prior to visit.    Allergies:   Allergies  Allergen Reactions  . Codeine Other (See Comments)    Pt unsure if really allergic to this      OBJECTIVE:  Physical Exam  Vitals:   07/08/20 1314  BP: 129/76  Pulse: 85  Weight: 154 lb (69.9 kg)  Height: 5\' 4"  (1.626 m)   Body mass index is 26.43 kg/m. No exam data present  Post stroke PHQ 2/9 Depression screen PHQ 2/9 06/20/2020  Decreased Interest 0  Down, Depressed, Hopeless 0  PHQ - 2 Score 0     General: well developed, well nourished,  very pleasant elderly Caucasian  female, seated, in no evident distress Head: head normocephalic and atraumatic.   Neck: supple with no carotid or supraclavicular bruits Cardiovascular: regular rate and rhythm, no murmurs Musculoskeletal: Shorter left leg with use of platform shoe Skin:  no rash/petichiae Vascular:  Normal pulses all extremities   Neurologic Exam Mental Status: Awake and fully alert. Fluent speech and language. Oriented to place and time. Recent and remote memory intact. Attention span, concentration and fund of knowledge appropriate. Mood and affect appropriate.  Cranial Nerves: Fundoscopic exam reveals sharp disc margins. Pupils equal, briskly reactive to light. Extraocular movements full without nystagmus. Visual fields full to confrontation. Hearing intact. Facial sensation intact. Face, tongue, palate moves normally and symmetrically.  Motor: Normal bulk and tone. Normal strength in all tested extremity muscles Sensory.: intact to touch , pinprick , position and vibratory sensation.  Coordination: Rapid alternating movements normal in all extremities. Finger-to-nose and heel-to-shin performed accurately bilaterally. Gait and Station: Arises from chair without difficulty. Stance is normal.  Gait abnormality with decreased step length and wide-based gait with use of cane and left platform shoe Reflexes: 1+ and symmetric. Toes downgoing.     NIHSS  0 Modified Rankin  2      ASSESSMENT: SEMRA WENSEL is a 80 y.o. year old female with multifocal acute ischemia within the right ACA territory with right A2 ACA occlusion, embolic secondary to unknown source on 05/26/2020 after presenting with several day history of LLE numbness/weakness resulting in a fall with left wrist fracture. Vascular risk factors include HLD, chronic right cerebellar infarc and advanced age.      PLAN:  1. R ACA stroke, cryptogenic:  a. Residual deficit: Gait impairment and subjective LLE weakness.  Encourage continued  participation with neuro rehab PT/OT for likely ongoing recovery.  Advised use of cane at all times or as instructed by therapist.   b. Continue aspirin 81 mg daily and clopidogrel 75 mg daily  and atorvastatin 40 mg daily for secondary stroke prevention.  Continue DAPT for total of 3 months then aspirin alone in setting of right ACA occlusion per Dr. Erlinda Hong recommendations.  c. Loop recorder has not shown atrial fibrillation thus far -monthly download reports scheduled.   d. Discussed secondary stroke prevention measures and importance of close PCP follow up for aggressive stroke risk factor management  2. HLD: LDL goal <70. Recent LDL 143 therefore atorvastatin 40 mg daily initiated during stroke admission.  Request follow-up with PCP in the next 1 to 2 months for repeat lipid panel and ongoing prescribing of atorvastatin 3. Hot Flashes: Postmenopausal.  Previously on estrogen and discontinued in setting of recent stroke.  Discussed increased risk of stroke with use of estrogen and would recommend nonhormonal treatment options which can be further discussed with her PCP or OB/GYN    Follow up in 4 months or call earlier if needed   CC:  [facility] provider: Dr. Lorine Bears, Lennette Bihari, MD    I spent 45 minutes of face-to-face and non-face-to-face time with patient.  This included previsit chart review including recent hospitalization pertinent progress notes, lab work and imaging, lab review, study review, order entry, electronic health record documentation, patient education regarding recent stroke and potential etiology, residual deficits, indication and ongoing monitoring of loop recorder, importance of managing stroke risk factors, postmenopausal hot flashes and increased stroke risk on estrogen and answered all other questions to patient satisfaction   Frann Rider, AGNP-BC  [facility] [address] [place], [place] [address]  Phone [phone] Fax  [phone] Note: This document was prepared with digital dictation and possible smart phrase technology. Any transcriptional errors that result from this process are unintentional.

## 2020-07-08 ENCOUNTER — Encounter: Payer: Self-pay | Admitting: Adult Health

## 2020-07-08 ENCOUNTER — Ambulatory Visit: Payer: Medicare HMO | Admitting: Adult Health

## 2020-07-08 VITALS — BP 129/76 | HR 85 | Ht 64.0 in | Wt 154.0 lb

## 2020-07-08 DIAGNOSIS — I639 Cerebral infarction, unspecified: Secondary | ICD-10-CM | POA: Diagnosis not present

## 2020-07-08 DIAGNOSIS — E785 Hyperlipidemia, unspecified: Secondary | ICD-10-CM

## 2020-07-08 NOTE — Patient Instructions (Addendum)
Continue physical and occupational therapy for likely ongoing improvement  Discuss hot flashes with Dr. Rex Kras and non hormonal therapies   We will continue to monitor your loop recorder - you will be notified if there is any evidence of abnormal heart rhythm   Continue aspirin 81 mg daily and clopidogrel 75 mg daily  and atorvastatin  for secondary stroke prevention  Continue plavix for additional 2 months (~the end of march) and then discontinue - you will then continue on aspirin alone  Continue to follow up with PCP regarding cholesterol and blood pressure management  Maintain strict control of hypertension with blood pressure goal below 130/90 and cholesterol with LDL cholesterol (bad cholesterol) goal below 70 mg/dL.      Followup in the future with me in 4 months or call earlier if needed      Thank you for coming to see [place] at [facility]. I hope we have been able to provide you high quality care today.  You may receive a patient satisfaction survey over the next few weeks. We would appreciate your feedback and comments so that we may continue to improve ourselves and the health of our patients.     Stroke Prevention Some medical conditions and lifestyle choices can lead to a higher risk for a stroke. You can help to prevent a stroke by eating healthy foods and exercising. It also helps to not smoke and to manage any health problems you may have. How can this condition affect me? A stroke is an emergency. It should be treated right away. A stroke can lead to brain damage or threaten your life. There is a better chance of surviving and getting better after a stroke if you get medical help right away. What can increase my risk? The following medical conditions may increase your risk of a stroke:  Diseases of the heart and blood vessels (cardiovascular disease).  High blood pressure (hypertension).  Diabetes.  High cholesterol.  Sickle cell  disease.  Problems with blood clotting.  Being very overweight.  Sleeping problems (obstructivesleep apnea). Other risk factors include:  Being older than age 61.  A history of blood clots, stroke, or mini-stroke (TIA).  Race, ethnic background, or a family history of stroke.  Smoking or using tobacco products.  Taking birth control pills, especially if you smoke.  Heavy alcohol and drug use.  Not being active. What actions can I take to prevent this? Manage your health conditions  High cholesterol. ? Eat a healthy diet. If this is not enough to manage your cholesterol, you may need to take medicines. ? Take medicines as told by your doctor.  High blood pressure. ? Try to keep your blood pressure below 130/80. ? If your blood pressure cannot be managed through a healthy diet and regular exercise, you may need to take medicines. ? Take medicines as told by your doctor. ? Ask your doctor if you should check your blood pressure at home. ? Have your blood pressure checked every year.  Diabetes. ? Eat a healthy diet and get regular exercise. If your blood sugar (glucose) cannot be managed through diet and exercise, you may need to take medicines. ? Take medicines as told by your doctor.  Talk to your doctor about getting checked for sleeping problems. Signs of a problem can include: ? Snoring a lot. ? Feeling very tired.  Make sure that you manage any other conditions you have. Nutrition  Follow instructions from your doctor about what to  eat or drink. You may be told to: ? Eat and drink fewer calories each day. ? Limit how much salt (sodium) you use to 1,500 milligrams (mg) each day. ? Use only healthy fats for cooking, such as olive oil, canola oil, and sunflower oil. ? Eat healthy foods. To do this:  Choose foods that are high in fiber. These include whole grains, and fresh fruits and vegetables.  Eat at least 5 servings of fruits and vegetables a day. Try to fill  one-half of your plate with fruits and vegetables at each meal.  Choose low-fat (lean) proteins. These include low-fat cuts of meat, chicken without skin, fish, tofu, beans, and nuts.  Eat low-fat dairy products. ? Avoid foods that:  Are high in salt.  Have saturated fat.  Have trans fat.  Have cholesterol.  Are processed or pre-made. ? Count how many carbohydrates you eat and drink each day.   Lifestyle  If you drink alcohol: ? Limit how much you have to:  0-1 drink a day for women who are not pregnant.  0-2 drinks a day for men. ? Know how much alcohol is in your drink. In the [place] one drink equals one 12 oz bottle of beer ( ), one 5 oz glass of wine ( ), or one 1 oz glass of hard liquor (37mL).  Do not smoke or use any products that have nicotine or tobacco. If you need help quitting, ask your doctor.  Avoid secondhand smoke.  Do not use drugs. Activity  Try to stay at a healthy weight.  Get at least 30 minutes of exercise on most days, such as: ? Fast walking. ? Biking. ? Swimming.   Medicines  Take over-the-counter and prescription medicines only as told by your doctor.  Avoid taking birth control pills. Talk to your doctor about the risks of taking birth control pills if: ? You are over 56 years old. ? You smoke. ? You get very bad headaches. ? You have had a blood clot. Where to find more information  American Stroke Association: www.strokeassociation.org Get help right away if:  You or a loved one has any signs of a stroke. "BE FAST" is an easy way to remember the warning signs: ? B - Balance. Dizziness, sudden trouble walking, or loss of balance. ? E - Eyes. Trouble seeing or a change in how you see. ? F - Face. Sudden weakness or loss of feeling of the face. The face or eyelid may droop on one side. ? A - Arms. Weakness or loss of feeling in an arm. This happens all of a sudden and most often on one side of the body. ? S - Speech. Sudden  trouble speaking, slurred speech, or trouble understanding what people say. ? T - Time. Time to call emergency services. Write down what time symptoms started.  You or a loved one has other signs of a stroke, such as: ? A sudden, very bad headache with no known cause. ? Feeling like you may vomit (nausea). ? Vomiting. ? A seizure. These symptoms may be an emergency. Get help right away. Call your local emergency services (911 in the [place]  Do not wait to see if the symptoms will go away.  Do not drive yourself to the hospital. Summary  You can help to prevent a stroke by eating healthy, exercising, and not smoking. It also helps to manage any health problems you have.  Do not smoke or use any products that contain nicotine or  tobacco.  Get help right away if you or a loved one has any signs of a stroke. This information is not intended to replace advice given to you by your health care provider. Make sure you discuss any questions you have with your health care provider. Document Revised: 12/31/2019 Document Reviewed: 12/31/2019 Elsevier Patient Education  [place].

## 2020-07-08 NOTE — Progress Notes (Signed)
I agree with the above plan 

## 2020-07-10 ENCOUNTER — Ambulatory Visit: Payer: Medicare HMO | Admitting: Rehabilitation

## 2020-07-10 ENCOUNTER — Telehealth: Payer: Self-pay | Admitting: Rehabilitation

## 2020-07-10 ENCOUNTER — Other Ambulatory Visit: Payer: Self-pay

## 2020-07-10 ENCOUNTER — Encounter: Payer: Self-pay | Admitting: Rehabilitation

## 2020-07-10 DIAGNOSIS — R2681 Unsteadiness on feet: Secondary | ICD-10-CM

## 2020-07-10 DIAGNOSIS — R262 Difficulty in walking, not elsewhere classified: Secondary | ICD-10-CM | POA: Diagnosis not present

## 2020-07-10 DIAGNOSIS — R293 Abnormal posture: Secondary | ICD-10-CM | POA: Diagnosis not present

## 2020-07-10 DIAGNOSIS — M79642 Pain in left hand: Secondary | ICD-10-CM | POA: Diagnosis not present

## 2020-07-10 DIAGNOSIS — M2170 Unequal limb length (acquired), unspecified site: Secondary | ICD-10-CM

## 2020-07-10 DIAGNOSIS — M6281 Muscle weakness (generalized): Secondary | ICD-10-CM

## 2020-07-10 DIAGNOSIS — I69354 Hemiplegia and hemiparesis following cerebral infarction affecting left non-dominant side: Secondary | ICD-10-CM

## 2020-07-10 DIAGNOSIS — R208 Other disturbances of skin sensation: Secondary | ICD-10-CM | POA: Diagnosis not present

## 2020-07-10 NOTE — Telephone Encounter (Signed)
Janett Billow,   I am seeing [name] here at [facility] and I know you just saw her not too long ago for follow up and mentioned it in your note.  We took formal leg length measurements today and I will send you that note, but I feel that she would benefit from a slightly higher lift and will need a new order for new L shoe lift to get this handled at [employer].  If you agree, would you please write this order in Epic and I will give to her at next session?  Thanks so much!! Cameron Sprang, PT, MPT [facility] [address] [place], [place], [address] Phone: [phone]   Fax:  [phone] 07/10/20, 6:31 PM

## 2020-07-10 NOTE — Therapy (Signed)
[facility] [address] [place], [place], [address] Phone: [phone]   Fax:  [phone]  Physical Therapy Treatment  Patient Details  Name: [name] MRN: [id number] Date of Birth: [birth year] Referring Provider (PT): Letta Pate Luanna Salk, MD   Encounter Date: 07/10/2020   PT End of Session - 07/10/20 1824    Visit Number 3    Number of Visits 13    Date for PT Re-Evaluation 08/03/20    Authorization Type [employer]    PT Start Time 4166    PT Stop Time 1402    PT Time Calculation (min) 44 min    Activity Tolerance Patient tolerated treatment well    Behavior During Therapy [facility] for tasks assessed/performed           Past Medical History:  Diagnosis Date  . Blood transfusion without reported diagnosis    with hip fracture-80 years old  . Cancer [facility]) 1983   Kidney Cancer  . Diverticulosis   . GERD (gastroesophageal reflux disease)   . Personal history of kidney cancer     Past Surgical History:  Procedure Laterality Date  . ABDOMINAL HYSTERECTOMY    . COLON SURGERY     Severe sigmoid diverticulitis  . COLONOSCOPY    . FOOT SURGERY    . HIP SURGERY    . Kidney cancer     Kidney removed  . LOOP RECORDER INSERTION N/A 05/30/2020   Procedure: LOOP RECORDER INSERTION;  Surgeon: Vickie Epley, MD;  Location: [facility];  Service: Cardiovascular;  Laterality: N/A;  . POLYPECTOMY      There were no vitals filed for this visit.   Subjective Assessment - 07/10/20 1324    Subjective Reports she is doing many things at home like she used to prior to CVA.    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 Yes    Pain Score 2    normal back pain   Pain Location Back    Pain Orientation Lower    Pain Descriptors / Indicators Aching    Pain Type Chronic pain    Pain Onset More than a month ago    Pain Frequency Intermittent                             [facility] Adult PT Treatment/Exercise - 07/10/20 1330      Ambulation/Gait   Ambulation/Gait Yes    Ambulation/Gait Assistance 6: Modified independent (Device/Increase time);5: Supervision    Ambulation/Gait Assistance Details Pt ambulatory into clinic today with her husband's SBQC at mostly mod I level with some cues for safety and ensuring cane remains in contact with ground during gait.  She plans to get quad tip cane therefore PT looked on [email or web address] and printed options for her as she would like to have whole new cane of her own instead of using husband's SPC at home.  Pt verbalized understanding and will order.  Once ambulating with quad tip cane during session, provided min cues for upright posture, forward gaze, and cane placement as she  tends to place it close to R foot, causing very narrow BOS. Pt improved with cuing during session.    Ambulation Distance (Feet) 250 Feet    Assistive device Straight cane;Small based quad cane   SPC with quad tip   Gait Pattern Step-through pattern;Decreased step length - right;Decreased step length - left;Decreased stride length;Trunk flexed    Ambulation Surface Level;Indoor      Self-Care   Self-Care Other Self-Care Comments    Other Self-Care Comments  Pt discussing that she does not feel that she needs OT anymore, however has still not been cleared for WB at this time. Has appt on 2/1 with Dr. Veverly Fells and will hopefully be cleared but recommended she keep her OT appt on 2/2 to discuss DC and ensure she is meeting goals.  Also discussed that PT would send this note with measurements regarding leg  length descrepency to [facility] as she just saw this patient to write order for new shoe lift as she needs lift to be 1/2" higher (there is still actually 1" difference, however she would likely tolerate 1/2" very well).  Pt verbalized understanding.      Therapeutic Activites    Therapeutic Activities Other Therapeutic Activities    Other Therapeutic Activities Legth length measurements: R ASIS to medial tibial plateau: 20", R lateral tibial plateau to R lateral malleolus 16 3/4"; L ASIS to medial tibial plateau 18 1/2", L lateral tibial plateau to lateral malleolus 15 1/2".  Her current shoe lift is 1", still leaving approx 1" difference.  During session donned 56mm shoe lift (along with attachment which was also about 107mm) and ambulated x 115 with quad tip cane.  Pt noted marked improvement in discomfort in low back and PT notes less antalgic gait with less hip drop due to leg length improvement.      Neuro Re-ed    Neuro Re-ed Details  Pt reports not being compliant with corner balance exercises as she had difficulty seeing how these translate to functional tasks.  PT educated on vestibular system and how this impacts balance, esp when ambulating over unlevel or compliant surfaces such as grass, etc.  Pt verbalized understanding and performed standing balance on foam airex during session:  feet apart EO with horizontal and vertical head turns x 10 reps each, feet apart EC x 2 reps of 20 secs, feet together EO x 2 sets of 20 secs.  Pt with moderate postural sway therefore recommended she do corner tasks at least once a day.  Pt verbalized understanding.                    PT Short Term Goals - 06/19/20 1713      PT SHORT TERM GOAL #1   Title Pt will initiate strengthening and balance HEP    Time 3    Period Weeks    Status New    Target Date 07/10/20      PT SHORT TERM GOAL #2   Title Pt will perform five time sit to stand from chair without use of RUE, safely in </= 14 seconds     Baseline 16.22, RUE support, uncontrolled stand > sit    Time 3    Period Weeks    Status New    Target Date 07/10/20      PT SHORT TERM GOAL #3   Title Pt will increase gait velocity to 2.2 ft/sec without AD    Baseline 1.8 ft/sec without AD  Time 3    Period Weeks    Status New    Target Date 07/10/20      PT SHORT TERM GOAL #4   Title Pt will increase DGI by 3 points to indicate decreased falls risk    Baseline 14/24    Time 3    Period Weeks    Status New    Target Date 07/10/20             PT Long Term Goals - 06/19/20 1716      PT LONG TERM GOAL #1   Title Pt will report improvement in overall UE function on FOTO to 78% by D/C from PT/OT    Baseline 66%    Time 6    Period Weeks    Status New    Target Date 08/03/20      PT LONG TERM GOAL #2   Title Pt will improve balance and functional strength to perform 5 time sit to stand without use of UE to </= 13 seconds    Time 6    Period Weeks    Status New    Target Date 08/03/20      PT LONG TERM GOAL #3   Title Pt will increase gait velocity without AD to >/= 2.62 ft/sec    Time 6    Period Weeks    Status New    Target Date 08/03/20      PT LONG TERM GOAL #4   Title Pt will increase DGI to >/= 19/24 to indicate low falls risk with ambulation    Time 6    Period Weeks    Status New    Target Date 08/03/20      PT LONG TERM GOAL #5   Title Pt will ambulate x 500' over indoor and paved outdoor surfaces MOD I without AD and without LOB    Time 6    Period Weeks    Status New    Target Date 08/03/20                 Plan - 07/10/20 1825    Clinical Impression Statement Skilled session focused on formal assessment of leg length descrepancy with measurement of femur and tibia.  She is still having approx 1" difference on L despite having 1" shoe lift.  PT trialed use of shoe lift in session with great improvement in pain and gait quality.  Will send request for new order for shoe lift to MD.   Also continue to work on balance on compliant surfaces as she does have difficulty with exercises requiring vestibular input, esp eyes closed.    Personal Factors and Comorbidities Age;Comorbidity 3+    Comorbidities Blood transfusion, Kidney CA, Diverticulitis, foot surgery, hip surgery, loop recorder insertion, wrist fracture due to fall    Examination-Activity Limitations [place];Locomotion Level;Stairs;Stand;Transfers    Examination-Participation Restrictions Cleaning;Driving;Community Activity;Laundry;Meal Prep;Shop    Stability/Clinical Decision Making Evolving/Moderate complexity    Rehab Potential Good    PT Frequency 2x / week    PT Duration 6 weeks    PT Treatment/Interventions ADLs/Self Care Home Management;DME Instruction;Gait training;Stair training;Functional mobility training;Therapeutic activities;Therapeutic exercise;Balance training;Neuromuscular re-education;Patient/family education    PT Next Visit Plan Check STGs and if order for new shoe lift in in EPIC, give to pt so she can call [employer]. gait training with quad tip cane, continue balance exercises on compliant surfaces, EO/EC, perhaps give stretches for low back/core strength??    PT Home Exercise  Plan [id number]    Consulted and Agree with Plan of Care Patient           Patient will benefit from skilled therapeutic intervention in order to improve the following deficits and impairments:  Decreased balance,Decreased strength,Difficulty walking,Impaired sensation,Postural dysfunction  Visit Diagnosis: Hemiplegia and hemiparesis following cerebral infarction affecting left non-dominant side (HCC)  Unsteadiness on feet  Muscle weakness (generalized)     Problem List Patient Active Problem List   Diagnosis Date Noted  . Gait disturbance, post-stroke 06/20/2020  . Chronic neck pain 06/06/2020  . Constipation 06/06/2020  . 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  . Stroke (cerebrum) ([place]) 05/31/2020  . Cerebral  ischemia 05/27/2020  . Left leg weakness 05/27/2020  . Triquetral fracture 05/27/2020  . Acute CVA (cerebrovascular accident) ([place]) 05/26/2020  . Left leg numbness 05/26/2020  . GERD (gastroesophageal reflux disease) 05/26/2020  . 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 Posterior vitreous detachment of both eyes 04/29/2020  . 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 Left epiretinal membrane 04/29/2020  . Pain in right knee 05/26/2018  . Bilateral lower abdominal pain 10/21/2010  . History of diverticulitis of colon 10/21/2010  . History of Clostridium difficile infection 10/21/2010    Cameron Sprang, PT, MPT [facility] [address] [place], [place], [address] Phone: [phone]   Fax:  [phone] 07/10/20, 6:29 PM  Name: [name] MRN: [id number] Date of Birth: 1940/11/13

## 2020-07-15 ENCOUNTER — Encounter: Payer: PPO | Admitting: Occupational Therapy

## 2020-07-15 ENCOUNTER — Ambulatory Visit: Payer: Medicare HMO | Admitting: Physical Medicine & Rehabilitation

## 2020-07-15 ENCOUNTER — Telehealth: Payer: Self-pay | Admitting: Adult Health

## 2020-07-15 ENCOUNTER — Ambulatory Visit: Payer: PPO | Admitting: Physical Therapy

## 2020-07-15 DIAGNOSIS — M25532 Pain in left wrist: Secondary | ICD-10-CM | POA: Diagnosis not present

## 2020-07-15 NOTE — Telephone Encounter (Signed)
She may want to schedule visit with PCP for further evaluation and to ensure no concern of DVT

## 2020-07-15 NOTE — Telephone Encounter (Signed)
Pt woke this am noticed swelling on back of L leg.  Sore, no discoloration, (was at Dr. Tamala Julian cardiology for husband, he stated felt like cyst. Not noted before.  It is not necessarily painful or warm.  She does not wear compression stockings.  I told her she could elevate her leg and see if continue's.  If persists may see pcp.

## 2020-07-15 NOTE — Telephone Encounter (Signed)
I spoke to pt and relayed for concerns of possible DVT see pcp, she verbalized understanding and appreciated call back.

## 2020-07-15 NOTE — Telephone Encounter (Signed)
Pt called, notice today swelling on leg, no pain. Would like a call from the nurse.

## 2020-07-16 ENCOUNTER — Ambulatory Visit: Payer: Medicare HMO | Attending: Physical Medicine and Rehabilitation | Admitting: Occupational Therapy

## 2020-07-16 ENCOUNTER — Encounter: Payer: Self-pay | Admitting: Occupational Therapy

## 2020-07-16 ENCOUNTER — Other Ambulatory Visit: Payer: Self-pay

## 2020-07-16 DIAGNOSIS — M79642 Pain in left hand: Secondary | ICD-10-CM | POA: Insufficient documentation

## 2020-07-16 DIAGNOSIS — R262 Difficulty in walking, not elsewhere classified: Secondary | ICD-10-CM | POA: Insufficient documentation

## 2020-07-16 DIAGNOSIS — I69354 Hemiplegia and hemiparesis following cerebral infarction affecting left non-dominant side: Secondary | ICD-10-CM | POA: Insufficient documentation

## 2020-07-16 DIAGNOSIS — R293 Abnormal posture: Secondary | ICD-10-CM | POA: Insufficient documentation

## 2020-07-16 DIAGNOSIS — M6281 Muscle weakness (generalized): Secondary | ICD-10-CM | POA: Diagnosis not present

## 2020-07-16 DIAGNOSIS — R208 Other disturbances of skin sensation: Secondary | ICD-10-CM | POA: Diagnosis not present

## 2020-07-16 DIAGNOSIS — R2681 Unsteadiness on feet: Secondary | ICD-10-CM | POA: Diagnosis not present

## 2020-07-16 NOTE — Therapy (Signed)
[facility] [address] [place], [place], [address] Phone: [phone]   Fax:  [phone]  Occupational Therapy Treatment  Patient Details  Name: [name] MRN: [id number] Date of Birth: 02-Mar-1941 Referring Provider (OT): Algis Liming   Encounter Date: 07/16/2020   OT End of Session - 07/16/20 1437    Visit Number 3    Number of Visits 9    Date for OT Re-Evaluation 09/02/20    OT Start Time 1400    OT Stop Time 1438    OT Time Calculation (min) 38 min    Activity Tolerance Patient tolerated treatment well    Behavior During Therapy [facility] for tasks assessed/performed           Past Medical History:  Diagnosis Date  . Blood transfusion without reported diagnosis    with hip fracture-79 years old  . Cancer [facility]) 1983   Kidney Cancer  . Diverticulosis   . GERD (gastroesophageal reflux disease)   . Personal history of kidney cancer     Past Surgical History:  Procedure Laterality Date  . ABDOMINAL HYSTERECTOMY    . COLON SURGERY     Severe sigmoid diverticulitis  . COLONOSCOPY    . FOOT SURGERY    . HIP SURGERY    . Kidney cancer     Kidney removed  . LOOP RECORDER INSERTION N/A 05/30/2020   Procedure: LOOP RECORDER INSERTION;  Surgeon: Vickie Epley, MD;  Location: [place] [facility];  Service: Cardiovascular;  Laterality: N/A;  . POLYPECTOMY      There were no vitals filed for this visit.   Subjective Assessment - 07/16/20 1409    Subjective  Dr Jacelyn Grip my wrist is doing good    Pertinent History Kidney cancer - removed right kidney    Currently in Pain? No/denies              [facility] OT Assessment - 07/16/20 0001      Hand Function   Right Hand Grip (lbs) 50    Left Hand Grip (lbs) 38                    OT Treatments/Exercises (OP) - 07/16/20 0001      ADLs   Driving Discussed graduated driving program.    ADL Comments Patient saw Dr Veverly Fells and he has d/c'd wrist  brace.  Patient has met remianing OT goals and is agreeable to discharge.                  OT Education - 07/16/20 1436    Education Details Reviewed remaining goals.  Graduated driving program    Person(s) Educated Patient    Methods Explanation;Handout    Comprehension Verbalized understanding               OT Long Term Goals - 07/16/20 1410      OT LONG TERM GOAL #1   Title Patient will complete HEP designed to improve hand strength due 08/03/20    Time 4    Period Weeks    Status Achieved      OT LONG TERM GOAL #2   Title Patient will shower with modified independence    Time 4    Period Weeks    Status Achieved      OT LONG TERM GOAL #3   Title Patient will demonstrate 3 lb increase in left grip strength to improve functional grasp with IADL  Time 4    Period Weeks    Status Achieved   48lb!     OT LONG TERM GOAL #4   Title Patient will demonstrate awareness of return to driving recommendations    Time 4    Period Weeks    Status Achieved                 Plan - 07/16/20 1438    Clinical Impression Statement Patient has met OT goals and is ready and agreeable for discharge    OT Occupational Profile and History Problem Focused Assessment - Including review of records relating to presenting problem    Occupational performance deficits (Please refer to evaluation for details): ADL's;IADL's    Body Structure / Function / Physical Skills ADL;Endurance;UE functional use;Balance;Pain;IADL;Flexibility;Body mechanics;FMC;Mobility    Rehab Potential Excellent    Clinical Decision Making Limited treatment options, no task modification necessary    Comorbidities Affecting Occupational Performance: May have comorbidities impacting occupational performance    Modification or Assistance to Complete Evaluation  No modification of tasks or assist necessary to complete eval    OT Frequency 2x / week    OT Duration 4 weeks    OT Treatment/Interventions  Self-care/ADL training;Therapeutic exercise;Moist Heat;Neuromuscular education;Splinting;Patient/family education;Therapeutic activities;Balance training;Functional Mobility Training;Fluidtherapy;Cryotherapy;Ultrasound;DME and/or AE instruction;Manual Therapy    Plan discharge    Consulted and Agree with Plan of Care Patient           Patient will benefit from skilled therapeutic intervention in order to improve the following deficits and impairments:   Body Structure / Function / Physical Skills: ADL,Endurance,UE functional use,Balance,Pain,IADL,Flexibility,Body mechanics,FMC,Mobility       Visit Diagnosis: Hemiplegia and hemiparesis following cerebral infarction affecting left non-dominant side (HCC)  Unsteadiness on feet  Muscle weakness (generalized)  Other disturbances of skin sensation  Pain in left hand    Problem List Patient Active Problem List   Diagnosis Date Noted  . Gait disturbance, post-stroke 06/20/2020  . Chronic neck pain 06/06/2020  . Constipation 06/06/2020  . 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  . Stroke (cerebrum) ([place]) 05/31/2020  . Cerebral ischemia 05/27/2020  . Left leg weakness 05/27/2020  . Triquetral fracture 05/27/2020  . Acute CVA (cerebrovascular accident) ([place]) 05/26/2020  . Left leg numbness 05/26/2020  . GERD (gastroesophageal reflux disease) 05/26/2020  . 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 Posterior vitreous detachment of both eyes 04/29/2020  . 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 Left epiretinal membrane 04/29/2020  . Pain in right knee 05/26/2018  . Bilateral lower abdominal pain 10/21/2010  . History of diverticulitis of colon 10/21/2010  . History of Clostridium difficile infection 10/21/2010   OCCUPATIONAL THERAPY DISCHARGE SUMMARY  Visits from Start of Care: 3  Current functional level related to goals / functional  outcomes: Improved independence with ADL/IADL, Improved strength LUE  Remaining deficits: Balance   Education / Equipment: HEP - putty  Plan: Patient agrees to discharge.  Patient goals were met. Patient is being discharged due to meeting the stated rehab goals.  ?????     Mariah Milling, OTR/L 07/16/2020, 2:39 PM  [facility] [address] [place], [place], [address] Phone: [phone]   Fax:  [phone]  Name: [name] MRN: [id number] Date of Birth: August 20, 1940

## 2020-07-16 NOTE — Patient Instructions (Signed)
Graduated driving program.    You are ready to return to driving.   With a licensed driver that you trust, first start driving in a parking lot that is not crowded.   Turning, parking, backing up, braking, etc.    If that goes well, try driving a short route you know well - around your neighborhood, to church, or to grocery store - with a licensed driver.    Then you can add distance as you feel comfortable.    Last additions would be driving at night, driving on interstate, or driving in unfamiliar areas.

## 2020-07-17 ENCOUNTER — Ambulatory Visit: Payer: Medicare HMO | Admitting: Physical Therapy

## 2020-07-17 ENCOUNTER — Ambulatory Visit: Payer: Medicare HMO | Admitting: Occupational Therapy

## 2020-07-17 ENCOUNTER — Encounter: Payer: Self-pay | Admitting: Physical Therapy

## 2020-07-17 DIAGNOSIS — M79642 Pain in left hand: Secondary | ICD-10-CM | POA: Diagnosis not present

## 2020-07-17 DIAGNOSIS — I69354 Hemiplegia and hemiparesis following cerebral infarction affecting left non-dominant side: Secondary | ICD-10-CM | POA: Diagnosis not present

## 2020-07-17 DIAGNOSIS — R208 Other disturbances of skin sensation: Secondary | ICD-10-CM

## 2020-07-17 DIAGNOSIS — R293 Abnormal posture: Secondary | ICD-10-CM | POA: Diagnosis not present

## 2020-07-17 DIAGNOSIS — M6281 Muscle weakness (generalized): Secondary | ICD-10-CM | POA: Diagnosis not present

## 2020-07-17 DIAGNOSIS — R262 Difficulty in walking, not elsewhere classified: Secondary | ICD-10-CM

## 2020-07-17 DIAGNOSIS — R2681 Unsteadiness on feet: Secondary | ICD-10-CM | POA: Diagnosis not present

## 2020-07-17 NOTE — Therapy (Signed)
[facility] [address] [place], [place], [address] Phone: [phone]   Fax:  [phone]  Physical Therapy Treatment  Patient Details  Name: [name] MRN: [id number] Date of Birth: 26-Apr-1941 Referring Provider (PT): Letta Pate Luanna Salk, MD   Encounter Date: 07/17/2020   PT End of Session - 07/17/20 1244    Visit Number 4    Number of Visits 13    Date for PT Re-Evaluation 08/03/20    Authorization Type [employer]; 13 visits from 06/19/20 - 08/03/20    Authorization - Visit Number 4    Authorization - Number of Visits 13    PT Start Time 6147    PT Stop Time 1232    PT Time Calculation (min) 39 min    Activity Tolerance Patient tolerated treatment well    Behavior During Therapy [facility] for tasks assessed/performed           Past Medical History:  Diagnosis Date  . Blood transfusion without reported diagnosis    with hip fracture-80 years old  . Cancer [facility]) 1983   Kidney Cancer  . Diverticulosis   . GERD (gastroesophageal reflux disease)   . Personal history of kidney cancer     Past Surgical History:  Procedure Laterality Date  . ABDOMINAL HYSTERECTOMY    . COLON SURGERY     Severe sigmoid diverticulitis  . COLONOSCOPY    . FOOT SURGERY    . HIP SURGERY    . Kidney cancer     Kidney removed  . LOOP RECORDER INSERTION N/A 05/30/2020   Procedure: LOOP RECORDER INSERTION;  Surgeon: Vickie Epley, MD;  Location: [facility] [facility];  Service: Cardiovascular;  Laterality: N/A;  . POLYPECTOMY      There were no vitals filed for this visit.   Subjective Assessment - 07/17/20 1201    Subjective Is walking some at home without cane; no issues.  Wasn't able to see PCP about knot on the back of the L knee.    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 No/denies    Pain Onset More than a month ago              [facility] PT Assessment - 07/17/20 1208      Standardized Balance Assessment   Standardized Balance Assessment Five Times Sit to Stand;10 meter walk test;Dynamic Gait Index    Five times sit to stand comments  17 seconds without use of UE; LE pushing against seat, more controlled stand > sit    10 Meter Walk 14.5 seconds with cane or 2.26 ft/sec; without cane 13.06 seconds or 2.5 ft/sec - supervision      Dynamic Gait Index   Level Surface Mild Impairment    Change in Gait Speed Mild Impairment    Gait with Horizontal Head Turns Normal    Gait with Vertical Head Turns Normal    Gait and Pivot Turn Mild Impairment    Step Over Obstacle Mild Impairment    Step Around Obstacles Normal    Steps Mild Impairment    Total Score 19  DGI comment: 19/24                         [facility] Adult PT Treatment/Exercise - 07/17/20 1203      Self-Care   Self-Care Heat/Ice Application    Heat/Ice Application Assessed back of L knee; slightly more swollen in popliteal fossa but not red, hot or painful.  Advised pt to ice back of the knee after therapy and follow up with PCP when able.      Exercises   Exercises Other Exercises    Other Exercises  Sit <> stand as an exercise: x8 reps without use of UE focusing on full anterior lean and weight shift over feet to prevent pushing back of knee back against chair.  When transitioning from stand > sit cued pt to pause for 3-4 seconds and to sit slowly.                  PT Education - 07/17/20 1244    Education Details progress towards goals; due to progress made reduce frequency to 1x/week, updated HEP    Person(s) Educated Patient     Methods Explanation;Demonstration;Handout    Comprehension Verbalized understanding;Returned demonstration            PT Short Term Goals - 07/17/20 1222      PT SHORT TERM GOAL #1   Title Pt will initiate strengthening and balance HEP    Time 3    Period Weeks    Status Achieved    Target Date 07/10/20      PT SHORT TERM GOAL #2   Title Pt will perform five time sit to stand from chair without use of RUE, safely in </= 14 seconds    Baseline 17 seconds without UE, controlled stand >sit    Time 3    Period Weeks    Status Partially Met    Target Date 07/10/20      PT SHORT TERM GOAL #3   Title Pt will increase gait velocity to 2.2 ft/sec without AD    Baseline 1.8 ft/sec without AD > 2.5 ft/sec without AD    Time 3    Period Weeks    Status Achieved    Target Date 07/10/20      PT SHORT TERM GOAL #4   Title Pt will increase DGI by 3 points to indicate decreased falls risk    Baseline 14/24 > 19/24    Time 3    Period Weeks    Status Achieved    Target Date 07/10/20             PT Long Term Goals - 07/17/20 1249      PT LONG TERM GOAL #1   Title Pt will report improvement in overall UE function on FOTO to 78% by D/C from PT/OT    Baseline 66%    Time 6    Period Weeks    Status New    Target Date 08/03/20      PT LONG TERM GOAL #2   Title Pt will improve balance and functional strength to perform 5 time sit to stand without use of UE to </= 15 seconds    Time 6    Period Weeks    Status Revised    Target Date 08/03/20      PT LONG TERM GOAL #3   Title Pt will increase gait velocity without AD to >/= 2.62 ft/sec      Time 6    Period Weeks    Status New    Target Date 08/03/20      PT LONG TERM GOAL #4   Title Pt will increase DGI to >/= 21/24 to indicate low falls risk with ambulation    Baseline 19/24    Time 6    Period Weeks    Status Revised    Target Date 08/03/20      PT LONG TERM GOAL #5   Title Pt will ambulate x 500' over indoor and  paved outdoor surfaces MOD I without AD and without LOB    Time 6    Period Weeks    Status New    Target Date 08/03/20                 Plan - 07/17/20 1245    Clinical Impression Statement Performed assessment of progress towards goals.  Pt is making excellent progress and has met 3/4 STG.  Pt is now able to WB through L wrist and is primarily using quad cane in community and no AD in the home.  Pt demonstrates improved safety and decreased falls risk with gait as indicated by improvement in gait velocity and DGI without AD.  Pt continues to experience slight posterior LOB with sit <> stand; updated sit <> stand exercise on HEP.  LTG updated.  Will continue to address and progress towards LTG.    Personal Factors and Comorbidities Age;Comorbidity 3+    Comorbidities Blood transfusion, Kidney CA, Diverticulitis, foot surgery, hip surgery, loop recorder insertion, wrist fracture due to fall    Examination-Activity Limitations Bend;Locomotion Level;Stairs;Stand;Transfers    Examination-Participation Restrictions Cleaning;Driving;Community Activity;Laundry;Meal Prep;Shop    Stability/Clinical Decision Making Evolving/Moderate complexity    Rehab Potential Good    PT Frequency 2x / week    PT Duration 6 weeks    PT Treatment/Interventions ADLs/Self Care Home Management;DME Instruction;Gait training;Stair training;Functional mobility training;Therapeutic activities;Therapeutic exercise;Balance training;Neuromuscular re-education;Patient/family education    PT Next Visit Plan Is going to wait on the shoe with [employer].  Decrease to 1x/week frequency next week?  How is the swelling in back of L knee?  Stiar training with one rail 6 with R rail, 6 with L rail to simulate daughter's house; training with quad tip cane, continue balance exercises on compliant surfaces, EO/EC, perhaps give stretches for low back/core strength??    PT Home Exercise Plan [email or web address]    Consulted and Agree with Plan  of Care Patient           Patient will benefit from skilled therapeutic intervention in order to improve the following deficits and impairments:  Decreased balance,Decreased strength,Difficulty walking,Impaired sensation,Postural dysfunction  Visit Diagnosis: Hemiplegia and hemiparesis following cerebral infarction affecting left non-dominant side (HCC)  Other disturbances of skin sensation  Unsteadiness on feet  Difficulty in walking, not elsewhere classified  Abnormal posture     Problem List Patient Active Problem List   Diagnosis Date Noted  . Gait disturbance, post-stroke 06/20/2020  . Chronic neck pain 06/06/2020  . Constipation 06/06/2020  . 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  . Stroke (cerebrum) (HCC) 05/31/2020  . Cerebral ischemia 05/27/2020  . Left leg weakness 05/27/2020  . Triquetral fracture 05/27/2020  . Acute CVA (cerebrovascular accident) (HCC) 05/26/2020  . Left leg numbness 05/26/2020  . GERD (gastroesophageal reflux disease) 05/26/2020  . 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 Posterior vitreous detachment of both eyes 04/29/2020  . 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 Left epiretinal membrane 04/29/2020  . Pain in right knee 05/26/2018  . Bilateral lower abdominal pain 10/21/2010  . History of diverticulitis of colon 10/21/2010  . History of Clostridium difficile infection 10/21/2010    Rico Junker, PT, DPT 07/17/20    12:51 PM    [facility] [address] [place], [place], [address] Phone: [phone]   Fax:  [phone]  Name: [name] MRN: [id number] Date of Birth: 11/20/1940

## 2020-07-17 NOTE — Progress Notes (Signed)
[facility] Summary Report / Loop Recorder 

## 2020-07-17 NOTE — Patient Instructions (Signed)
Access Code: [email or web address] URL: [email or web address] Date: 07/17/2020 Prepared by: Licia Harl  Exercises Sit to Stand without Arm Support - 1 x daily - 5-7 x weekly - 1 sets - 10 reps Standing Marching - 1 x daily - 5-7 x weekly - 1 sets - 10 reps Tandem Stance with Support - 1 x daily - 5-7 x weekly - 1 sets - 3 reps - 15 secs hold Standing Hip Abduction with Counter Support - 1 x daily - 5-7 x weekly - 1 sets - 10 reps Romberg Stance with Eyes Closed - 1 x daily - 5-7 x weekly - 1 sets - 3 reps - 20 secs hold Wide Stance with Eyes Closed on Foam Pad - 1 x daily - 7 x weekly - 3 sets - 10 reps   

## 2020-07-18 ENCOUNTER — Other Ambulatory Visit: Payer: Self-pay

## 2020-07-18 ENCOUNTER — Encounter: Payer: Self-pay | Admitting: Physical Therapy

## 2020-07-18 ENCOUNTER — Ambulatory Visit: Payer: Medicare HMO | Admitting: Physical Therapy

## 2020-07-18 DIAGNOSIS — R262 Difficulty in walking, not elsewhere classified: Secondary | ICD-10-CM

## 2020-07-18 DIAGNOSIS — R293 Abnormal posture: Secondary | ICD-10-CM | POA: Diagnosis not present

## 2020-07-18 DIAGNOSIS — M79642 Pain in left hand: Secondary | ICD-10-CM | POA: Diagnosis not present

## 2020-07-18 DIAGNOSIS — I69354 Hemiplegia and hemiparesis following cerebral infarction affecting left non-dominant side: Secondary | ICD-10-CM | POA: Diagnosis not present

## 2020-07-18 DIAGNOSIS — M6281 Muscle weakness (generalized): Secondary | ICD-10-CM | POA: Diagnosis not present

## 2020-07-18 DIAGNOSIS — R2681 Unsteadiness on feet: Secondary | ICD-10-CM

## 2020-07-18 DIAGNOSIS — R208 Other disturbances of skin sensation: Secondary | ICD-10-CM | POA: Diagnosis not present

## 2020-07-21 NOTE — Therapy (Signed)
[facility] [address] [place], [place], [address] Phone: [phone]   Fax:  [phone]  Physical Therapy Treatment  Patient Details  Name: [name] MRN: [id number] Date of Birth: 04/05/1941 Referring Provider (PT): Charlett Blake, MD   Encounter Date: 07/18/2020     07/18/20 1833  PT Visits / Re-Eval  Visit Number 5  Number of Visits 13  Date for PT Re-Evaluation 08/03/20  Authorization  Authorization Type [employer]; 13 visits from 06/19/20 - 08/03/20  Authorization - Visit Number 5  Authorization - Number of Visits 13  PT Time Calculation  PT Start Time 1316  PT Stop Time 1400  PT Time Calculation (min) 44 min  PT - End of Session  Equipment Utilized During Treatment Gait belt  Activity Tolerance Patient tolerated treatment well  Behavior During Therapy [facility] for tasks assessed/performed    Past Medical History:  Diagnosis Date  . Blood transfusion without reported diagnosis    with hip fracture-80 years old  . Cancer [facility]) 1983   Kidney Cancer  . Diverticulosis   . GERD (gastroesophageal reflux disease)   . Personal history of kidney cancer     Past Surgical History:  Procedure Laterality Date  . ABDOMINAL HYSTERECTOMY    . COLON SURGERY     Severe sigmoid diverticulitis  . COLONOSCOPY    . FOOT SURGERY    . HIP SURGERY    . Kidney cancer     Kidney removed  . LOOP RECORDER INSERTION N/A 05/30/2020   Procedure: LOOP RECORDER INSERTION;  Surgeon: Vickie Epley, MD;  Location: [facility];  Service: Cardiovascular;  Laterality: N/A;  . POLYPECTOMY      There were no vitals filed for this visit.     07/18/20 1321  Symptoms/Limitations  Subjective No new complaints. Still with pain from the knot behind her left knee. To therapy with new staight cane with rubber quad tip.  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 Yes  Pain Score 4  Pain Location Knee  Pain Orientation Posterior  Pain Descriptors / Indicators Aching;Tender  Pain Type Acute pain  Pain Onset In the past 7 days  Pain Frequency Constant  Aggravating Factors  "stays the same"  Pain Relieving Factors ice somethimes      07/18/20 1330  Transfers  Transfers Sit to Stand;Stand to Sit  Sit to Stand 5: Supervision  Stand to Sit 5: Supervision  Ambulation/Gait  Ambulation/Gait Yes  Ambulation/Gait Assistance 5: Supervision  Ambulation/Gait Assistance Details around gym with session  Assistive device Straight cane (with rubber quad tip)  Gait Pattern Step-through pattern;Decreased step length - right;Decreased step length - left;Decreased stride length;Trunk flexed  Ambulation Surface Level;Indoor  High Level Balance  High Level Balance Activities Side stepping;Tandem walking;Marching forwards;Marching backwards (tandem forward/backwards)  High Level Balance Comments on red/blue mats next to coutner top: 3 laps each with light UE support. cues on ex form/technique and weight shifting.  Self-Care  Self-Care Other Self-Care Comments  Other Self-Care Comments  pt asking about order from [facility] for shoe lift to take to [employer]. Provided printed copy of  order from [facility]  Neuro Re-ed   Neuro Re-ed Details  for balance/muscle re-ed: gait around track working on speed changes, scanning all directions randomly and sudden stop/starts with min guard assist for safety.       07/18/20 1346  Balance Exercises: Standing  SLS with Vectors Foam/compliant surface;Other reps (comment);Limitations  SLS with  Vectors Limitations cones along edges of red/blue mats with cane support: alteranting foot fwd taps to each cone with side stepping left<>right for 1 lap each way, then alternating fwd double foot taps to each cone with side stepping left<>right for 1 lap each way. min guard assist for safety. cues on stance position and weight shifitng.           PT Short Term Goals - 07/17/20 1222      PT SHORT TERM GOAL #1   Title Pt will initiate strengthening and balance HEP    Time 3    Period Weeks    Status Achieved    Target Date 07/10/20      PT SHORT TERM GOAL #2   Title Pt will perform five time sit to stand from chair without use of RUE, safely in </= 14 seconds    Baseline 17 seconds without UE, controlled stand >sit    Time 3    Period Weeks    Status Partially Met    Target Date 07/10/20      PT SHORT TERM GOAL #3   Title Pt will increase gait velocity to 2.2 ft/sec without AD    Baseline 1.8 ft/sec without AD > 2.5 ft/sec without AD    Time 3    Period Weeks    Status Achieved    Target Date 07/10/20      PT SHORT TERM GOAL #4   Title Pt will increase DGI by 3 points to indicate decreased falls risk    Baseline 14/24 > 19/24    Time 3    Period Weeks    Status Achieved    Target Date 07/10/20             PT Long Term Goals - 07/17/20 1249      PT LONG TERM GOAL #1   Title Pt will report improvement in overall UE function on FOTO to 78% by D/C from PT/OT    Baseline 66%    Time 6    Period Weeks    Status New    Target Date 08/03/20      PT LONG TERM GOAL #2   Title Pt will improve balance and functional strength to perform 5 time sit to stand without use of UE to </= 15 seconds    Time 6    Period Weeks    Status Revised    Target Date 08/03/20      PT LONG TERM GOAL #3   Title Pt will increase gait velocity without AD to >/= 2.62 ft/sec    Time 6    Period Weeks    Status New    Target Date 08/03/20      PT LONG TERM GOAL #4   Title Pt will  increase DGI to >/= 21/24 to indicate low falls risk with ambulation    Baseline 19/24    Time 6    Period Weeks    Status Revised    Target Date 08/03/20      PT LONG TERM GOAL #5   Title Pt will ambulate x 500' over indoor and  paved outdoor surfaces MOD I without AD and without LOB    Time 6    Period Weeks    Status New    Target Date 08/03/20             07/18/20 1327  Plan  Clinical Impression Statement Today's skilled session continued to focus on balance training on complaint surfaces with no issues noted or reported in session. The pt is progressing toward goals and should benefit from continued PT to progress toward unmet goals. Pt is decreasing to 1x a week starting next week as per PT's last note due to great progress.  Personal Factors and Comorbidities Age;Comorbidity 3+  Comorbidities Blood transfusion, Kidney CA, Diverticulitis, foot surgery, hip surgery, loop recorder insertion, wrist fracture due to fall  Examination-Activity Limitations Bend;Locomotion Level;Stairs;Stand;Transfers  Examination-Participation Restrictions Cleaning;Driving;Community Activity;Laundry;Meal Prep;Shop  Pt will benefit from skilled therapeutic intervention in order to improve on the following deficits Decreased balance;Decreased strength;Difficulty walking;Impaired sensation;Postural dysfunction  Stability/Clinical Decision Making Evolving/Moderate complexity  Rehab Potential Good  PT Frequency 2x / week  PT Duration 6 weeks  PT Treatment/Interventions ADLs/Self Care Home Management;DME Instruction;Gait training;Stair training;Functional mobility training;Therapeutic activities;Therapeutic exercise;Balance training;Neuromuscular re-education;Patient/family education  PT Next Visit Plan Is going to wait on the shoe with [employer].  Decrease to 1x/week frequency next week?  How is the swelling in back of L knee?  Stiar training with one rail 6 with R rail, 6 with L rail to simulate  daughter's house; training with quad tip cane, continue balance exercises on compliant surfaces, EO/EC, perhaps give stretches for low back/core strength??  PT Home Exercise Plan [id number]  Consulted and Agree with Plan of Care Patient          Patient will benefit from skilled therapeutic intervention in order to improve the following deficits and impairments:  Decreased balance,Decreased strength,Difficulty walking,Impaired sensation,Postural dysfunction  Visit Diagnosis: Hemiplegia and hemiparesis following cerebral infarction affecting left non-dominant side (HCC)  Other disturbances of skin sensation  Unsteadiness on feet  Difficulty in walking, not elsewhere classified  Abnormal posture  Muscle weakness (generalized)     Problem List Patient Active Problem List   Diagnosis Date Noted  . Gait disturbance, post-stroke 06/20/2020  . Chronic neck pain 06/06/2020  . Constipation 06/06/2020  . 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  . Stroke (cerebrum) ([place]) 05/31/2020  . Cerebral ischemia 05/27/2020  . Left leg weakness 05/27/2020  . Triquetral fracture 05/27/2020  . Acute CVA (cerebrovascular accident) ([place]) 05/26/2020  . Left leg numbness 05/26/2020  . GERD (gastroesophageal reflux disease) 05/26/2020  . 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 Posterior vitreous detachment of both eyes 04/29/2020  . 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 Left epiretinal membrane 04/29/2020  . Pain in right knee 05/26/2018  . Bilateral lower abdominal pain 10/21/2010  . History of diverticulitis of colon 10/21/2010  . History of Clostridium difficile infection 10/21/2010    Willow Ora, PTA, [facility]patient Neuro [facility] [address] [place], [place] [address] [phone] 07/22/20, 8:36 AM   Name: [name] MRN: [id number] Date of Birth: 09-Nov-1940

## 2020-07-22 ENCOUNTER — Other Ambulatory Visit: Payer: Self-pay

## 2020-07-22 ENCOUNTER — Encounter: Payer: Self-pay | Admitting: Physical Therapy

## 2020-07-22 ENCOUNTER — Ambulatory Visit: Payer: Medicare HMO | Admitting: Physical Therapy

## 2020-07-22 DIAGNOSIS — R293 Abnormal posture: Secondary | ICD-10-CM

## 2020-07-22 DIAGNOSIS — R262 Difficulty in walking, not elsewhere classified: Secondary | ICD-10-CM | POA: Diagnosis not present

## 2020-07-22 DIAGNOSIS — M79642 Pain in left hand: Secondary | ICD-10-CM | POA: Diagnosis not present

## 2020-07-22 DIAGNOSIS — R208 Other disturbances of skin sensation: Secondary | ICD-10-CM | POA: Diagnosis not present

## 2020-07-22 DIAGNOSIS — R2681 Unsteadiness on feet: Secondary | ICD-10-CM

## 2020-07-22 DIAGNOSIS — I69354 Hemiplegia and hemiparesis following cerebral infarction affecting left non-dominant side: Secondary | ICD-10-CM

## 2020-07-22 DIAGNOSIS — M6281 Muscle weakness (generalized): Secondary | ICD-10-CM | POA: Diagnosis not present

## 2020-07-22 NOTE — Therapy (Signed)
[facility] [address] [place], [place], [address] Phone: [phone]   Fax:  [phone]  Physical Therapy Treatment  Patient Details  Name: [name] MRN: [id number] Date of Birth: Jan 13, 1941 Referring Provider (PT): Letta Pate Luanna Salk, MD   Encounter Date: 07/22/2020   PT End of Session - 07/22/20 1238    Visit Number 6    Number of Visits 13    Date for PT Re-Evaluation 08/03/20    Authorization Type [employer]; 13 visits from 06/19/20 - 08/03/20    Authorization - Visit Number 6    Authorization - Number of Visits 13    PT Start Time 1232    PT Stop Time 1312    PT Time Calculation (min) 40 min    Equipment Utilized During Treatment Gait belt    Activity Tolerance Patient tolerated treatment well    Behavior During Therapy [facility] for tasks assessed/performed           Past Medical History:  Diagnosis Date  . Blood transfusion without reported diagnosis    with hip fracture-80 years old  . Cancer [facility]) 1983   Kidney Cancer  . Diverticulosis   . GERD (gastroesophageal reflux disease)   . Personal history of kidney cancer     Past Surgical History:  Procedure Laterality Date  . ABDOMINAL HYSTERECTOMY    . COLON SURGERY     Severe sigmoid diverticulitis  . COLONOSCOPY    . FOOT SURGERY    . HIP SURGERY    . Kidney cancer     Kidney removed  . LOOP RECORDER INSERTION N/A 05/30/2020   Procedure: LOOP RECORDER INSERTION;  Surgeon: Vickie Epley, MD;  Location: [facility];  Service: Cardiovascular;  Laterality: N/A;  . POLYPECTOMY      There were no vitals filed for this visit.   Subjective Assessment - 07/22/20 1236    Subjective No new complaints. Still has the knot behind her left knee, plans to talk to Dr. Letta Pate about it next week.    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 Yes    Pain Score 3     Pain Location Knee    Pain Orientation Posterior    Pain Descriptors / Indicators Aching;Tender    Pain Type Acute pain    Pain Onset 1 to 4 weeks ago    Pain Frequency Constant    Aggravating Factors  "stays the same"    Pain Relieving Factors ice sometimes                  [facility] Adult PT Treatment/Exercise - 07/22/20 1240      Transfers   Transfers Sit to Stand;Stand to Sit    Sit to Stand 5: Supervision    Stand to Sit 5: Supervision      Ambulation/Gait   Ambulation/Gait Yes    Ambulation/Gait Assistance 5: Supervision    Ambulation/Gait Assistance Details around gym with session    Assistive device Straight cane   with rubber quad tip   Gait Pattern Step-through pattern;Decreased step length - right;Decreased step length -  left;Decreased stride length;Trunk flexed    Ambulation Surface Level;Indoor      Neuro Re-ed    Neuro Re-ed Details  for balance/muscle re-ed: gait along ~50 foot hallway: with cane, then without cane forward gait with head movements left<>fwd<>right, then up<>fwd<>down for 2 laps each/each way. Min guard assist for safety with no significant balance loss noted, minor decrease in gait speed.               Balance Exercises - 07/22/20 1251      Balance Exercises: Standing   Rockerboard Anterior/posterior;EO;10 seconds;30 seconds;Other reps (comment);Intermittent UE support;Limitations  On the board with no to occasional touch to bars while rocking the board with emphasis on tall posture with EO, progressing to [facility] for ~10 reps each. Then holding the board steady for EC 30 sec's x 3 reps. Min guard to min assist for balance.    Balance  Beam standing across blue foam beam: alternating forward stepping to floor/back onto the beam, then alternating backward stepping to floor/back onto beam for ~10 reps each way with min guard to min assist. Light touch on bars with pt occasionally able to let go. Cues for increased step length and step height to clear beam surface.    Tandem Gait Forward;Retro;4 reps;Upper extremity support;Foam/compliant surface  On blue foam beam with cues for step placement and posture with light UE support on bars. Min guard for safety.   Sidestepping Foam/compliant support;4 reps;Limitations On blue foam beam with cues for increased step length/step height. Min guard assist for safety with occasional touch to bars for balance.                PT Short Term Goals - 07/17/20 1222      PT SHORT TERM GOAL #1   Title Pt will initiate strengthening and balance HEP    Time 3    Period Weeks    Status Achieved    Target Date 07/10/20      PT SHORT TERM GOAL #2   Title Pt will perform five time sit to stand from chair without use of RUE, safely in </= 14 seconds    Baseline 17 seconds without UE, controlled stand >sit    Time 3    Period Weeks    Status Partially Met    Target Date 07/10/20      PT SHORT TERM GOAL #3   Title Pt will increase gait velocity to 2.2 ft/sec without AD    Baseline 1.8 ft/sec without AD > 2.5 ft/sec without AD    Time 3    Period Weeks    Status Achieved    Target Date 07/10/20      PT SHORT TERM GOAL #4   Title Pt will increase DGI by 3 points to indicate decreased falls risk    Baseline 14/24 > 19/24    Time 3    Period Weeks    Status Achieved    Target Date 07/10/20             PT Long Term Goals - 07/17/20 1249      PT LONG TERM GOAL #1   Title Pt will report improvement in overall UE function on FOTO to 78% by D/C from PT/OT    Baseline 66%    Time 6    Period Weeks    Status New    Target Date 08/03/20      PT LONG TERM GOAL #2   Title Pt will  improve balance and functional strength to perform 5 time sit to stand without use of UE to </= 15 seconds    Time 6    Period Weeks    Status Revised    Target Date 08/03/20      PT LONG TERM GOAL #3   Title Pt will increase gait velocity without AD to >/= 2.62 ft/sec    Time 6    Period Weeks    Status New    Target Date 08/03/20      PT LONG TERM GOAL #4   Title Pt will increase DGI to >/= 21/24 to indicate low falls risk with ambulation    Baseline 19/24    Time 6    Period Weeks    Status Revised    Target Date 08/03/20      PT LONG TERM GOAL #5   Title Pt will ambulate x 500' over indoor and paved outdoor surfaces MOD I without AD and without LOB    Time 6    Period Weeks    Status New    Target Date 08/03/20              07/22/20 1239  Plan  Clinical Impression Statement Today's skilled session continued to focus on dynamic gait with/without AD and balance on compliant surfaces with decreased UE support as able. The pt is making steady progress toward goals and should benefit from continued PT to progress toward unmet goals.  Personal Factors and Comorbidities Age;Comorbidity 3+  Comorbidities Blood transfusion, Kidney CA, Diverticulitis, foot surgery, hip surgery, loop recorder insertion, wrist fracture due to fall  Examination-Activity Limitations Bend;Locomotion Level;Stairs;Stand;Transfers  Examination-Participation Restrictions Cleaning;Driving;Community Activity;Laundry;Meal Prep;Shop  Pt will benefit from skilled therapeutic intervention in order to improve on the following deficits Decreased balance;Decreased strength;Difficulty walking;Impaired sensation;Postural dysfunction  Stability/Clinical Decision Making Evolving/Moderate complexity  Rehab Potential Good  PT Frequency 2x / week  PT Duration 6 weeks  PT Treatment/Interventions ADLs/Self Care Home Management;DME Instruction;Gait training;Stair training;Functional mobility training;Therapeutic  activities;Therapeutic exercise;Balance training;Neuromuscular re-education;Patient/family education  PT Next Visit Plan Stair training with one rail 6 with R rail, 6 with L rail to simulate daughter's house; training with quad tip cane, continue balance exercises on compliant surfaces, EO/EC, perhaps give stretches for low back/core strength??  PT Home Exercise Plan [id number]  Consulted and Agree with Plan of Care Patient         Patient will benefit from skilled therapeutic intervention in order to improve the following deficits and impairments:  Decreased balance,Decreased strength,Difficulty walking,Impaired sensation,Postural dysfunction  Visit Diagnosis: Hemiplegia and hemiparesis following cerebral infarction affecting left non-dominant side (HCC)  Other disturbances of skin sensation  Unsteadiness on feet  Difficulty in walking, not elsewhere classified  Abnormal posture     Problem List Patient Active Problem List   Diagnosis Date Noted  . Gait disturbance, post-stroke 06/20/2020  . Chronic neck pain 06/06/2020  . Constipation 06/06/2020  . 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  . Stroke (cerebrum) ([place]) 05/31/2020  . Cerebral ischemia 05/27/2020  . Left leg weakness 05/27/2020  . Triquetral fracture 05/27/2020  . Acute CVA (cerebrovascular accident) ([place]) 05/26/2020  . Left leg numbness 05/26/2020  . GERD (gastroesophageal reflux disease) 05/26/2020  . 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 Posterior vitreous detachment of both eyes 04/29/2020  . 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 Left epiretinal membrane 04/29/2020  . Pain in right knee 05/26/2018  .  Bilateral lower abdominal pain 10/21/2010  . History of diverticulitis of colon 10/21/2010  . History of Clostridium difficile infection 10/21/2010    Willow Ora, PTA, [facility] Outpatient Neuro [facility] [address],  [place], [place] [address] [phone] 07/22/20, 4:53 PM   Name: [name] MRN: [id number] Date of Birth: [birth year]

## 2020-07-25 ENCOUNTER — Ambulatory Visit: Payer: PPO | Admitting: Physical Therapy

## 2020-07-29 ENCOUNTER — Encounter: Payer: Medicare HMO | Attending: Physical Medicine & Rehabilitation | Admitting: Physical Medicine & Rehabilitation

## 2020-07-29 ENCOUNTER — Ambulatory Visit: Payer: Medicare HMO | Admitting: Physical Therapy

## 2020-07-29 ENCOUNTER — Other Ambulatory Visit: Payer: Self-pay

## 2020-07-29 ENCOUNTER — Encounter: Payer: Self-pay | Admitting: Physical Therapy

## 2020-07-29 ENCOUNTER — Encounter: Payer: Self-pay | Admitting: Physical Medicine & Rehabilitation

## 2020-07-29 VITALS — BP 148/67 | HR 76 | Temp 98.3°F | Ht 64.0 in | Wt 156.2 lb

## 2020-07-29 DIAGNOSIS — M2170 Unequal limb length (acquired), unspecified site: Secondary | ICD-10-CM

## 2020-07-29 DIAGNOSIS — I69354 Hemiplegia and hemiparesis following cerebral infarction affecting left non-dominant side: Secondary | ICD-10-CM

## 2020-07-29 DIAGNOSIS — I69398 Other sequelae of cerebral infarction: Secondary | ICD-10-CM | POA: Diagnosis not present

## 2020-07-29 DIAGNOSIS — M79642 Pain in left hand: Secondary | ICD-10-CM | POA: Diagnosis not present

## 2020-07-29 DIAGNOSIS — M25562 Pain in left knee: Secondary | ICD-10-CM | POA: Insufficient documentation

## 2020-07-29 DIAGNOSIS — R2681 Unsteadiness on feet: Secondary | ICD-10-CM | POA: Diagnosis not present

## 2020-07-29 DIAGNOSIS — R262 Difficulty in walking, not elsewhere classified: Secondary | ICD-10-CM | POA: Diagnosis not present

## 2020-07-29 DIAGNOSIS — M6281 Muscle weakness (generalized): Secondary | ICD-10-CM | POA: Diagnosis not present

## 2020-07-29 DIAGNOSIS — R269 Unspecified abnormalities of gait and mobility: Secondary | ICD-10-CM | POA: Diagnosis not present

## 2020-07-29 DIAGNOSIS — M25462 Effusion, left knee: Secondary | ICD-10-CM | POA: Insufficient documentation

## 2020-07-29 DIAGNOSIS — R293 Abnormal posture: Secondary | ICD-10-CM | POA: Diagnosis not present

## 2020-07-29 DIAGNOSIS — R208 Other disturbances of skin sensation: Secondary | ICD-10-CM | POA: Diagnosis not present

## 2020-07-29 NOTE — Therapy (Addendum)
[facility] [address] [place], [place], [address] Phone: [phone]   Fax:  [phone]  Physical Therapy Treatment and Discharge Summary  Patient Details  Name: [name] MRN: [id number] Date of Birth: [birth year] Referring Provider (PT): Letta Pate Luanna Salk, MD   Encounter Date: 07/29/2020    PT End of Session - 07/29/20 1343    Visit Number 7    Number of Visits 13    Date for PT Re-Evaluation 08/03/20    Authorization Type [employer]; 13 visits from 06/19/20 - 08/03/20    Authorization - Visit Number 7    Authorization - Number of Visits 13    PT Start Time 1448    PT Stop Time 1319    PT Time Calculation (min) 44 min    Activity Tolerance Patient tolerated treatment well    Behavior During Therapy [facility] for tasks assessed/performed           Past Medical History:  Diagnosis Date  . Blood transfusion without reported diagnosis    with hip fracture-80 years old  . Cancer [facility]) 1983   Kidney Cancer  . Diverticulosis   . GERD (gastroesophageal reflux disease)   . Personal history of kidney cancer     Past Surgical History:  Procedure Laterality Date  . ABDOMINAL HYSTERECTOMY    . COLON SURGERY     Severe sigmoid diverticulitis  . COLONOSCOPY    . FOOT SURGERY    . HIP SURGERY    . Kidney cancer     Kidney removed  . LOOP RECORDER INSERTION N/A 05/30/2020   Procedure: LOOP RECORDER INSERTION;  Surgeon: Vickie Epley, MD;  Location: [facility];  Service: Cardiovascular;  Laterality: N/A;  . POLYPECTOMY      There were no vitals filed for this visit.   Subjective Assessment - 07/29/20 1242    Subjective Still has the knot behind her knee; has appointment with Dr. Letta Pate this afternoon and plans to have him look at it.  Developed significant L hip pain on Saturday.  Due to hip pain has a hard time walking.    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 Yes    Pain Score 7     Pain Location Hip    Pain Orientation Left;Posterior    Pain Descriptors / Indicators Sharp;Throbbing    Pain Type Acute pain              [facility] PT Assessment - 07/29/20 1248      Assessment   Medical Diagnosis CVA, L wrist fracture    Referring Provider (PT) Letta Pate Luanna Salk, MD    Onset Date/Surgical Date 06/05/21    Hand Dominance Right    Prior Therapy CIR - D/C 06/06/20      Precautions   Precautions Other (comment);Fall      Prior Function   Level of Independence Independent with basic ADLs;Independent      Observation/Other Assessments   Focus on Therapeutic Outcomes (FOTO)  90% UE function      Standardized Balance Assessment   Standardized Balance Assessment Five Times  Sit to Stand;10 meter walk test;Dynamic Gait Index    Five times sit to stand comments  16.25 seconds without UE support; L foot back, R foot forwards.  Able to release arm rests and able to come to full stand    10 Meter Walk 16.18 seconds without cane; supervision.  2.0 ft/sec antalgic gait      Dynamic Gait Index   Level Surface Mild Impairment    Change in Gait Speed Mild Impairment    Gait with Horizontal Head Turns Normal    Gait with Vertical Head Turns Normal    Gait and Pivot Turn Mild Impairment    Step Over Obstacle Mild Impairment    Step Around Obstacles Normal    Steps Mild Impairment    Total Score 19    DGI comment: 19/24                                 PT Education - 07/29/20 1343    Education Details progress towards goals, pt safe for D/C but discussed indications for  returning to therapy in the future.  Pt to continue with current HEP    Person(s) Educated Patient    Methods Explanation    Comprehension Verbalized understanding            PT Short Term Goals - 07/17/20 1222      PT SHORT TERM GOAL #1   Title Pt will initiate strengthening and balance HEP    Time 3    Period Weeks    Status Achieved    Target Date 07/10/20      PT SHORT TERM GOAL #2   Title Pt will perform five time sit to stand from chair without use of RUE, safely in </= 14 seconds    Baseline 17 seconds without UE, controlled stand >sit    Time 3    Period Weeks    Status Partially Met    Target Date 07/10/20      PT SHORT TERM GOAL #3   Title Pt will increase gait velocity to 2.2 ft/sec without AD    Baseline 1.8 ft/sec without AD > 2.5 ft/sec without AD    Time 3    Period Weeks    Status Achieved    Target Date 07/10/20      PT SHORT TERM GOAL #4   Title Pt will increase DGI by 3 points to indicate decreased falls risk    Baseline 14/24 > 19/24    Time 3    Period Weeks    Status Achieved    Target Date 07/10/20             PT Long Term Goals - 07/29/20 1309      PT LONG TERM GOAL #1   Title Pt will report improvement in overall UE function on FOTO to 78% by D/C from PT/OT    Baseline 66% > 90%    Time 6    Period Weeks    Status Achieved      PT LONG TERM GOAL #2   Title Pt will improve balance and functional strength to perform 5 time sit to stand without use of UE to </= 15 seconds    Baseline 16 seconds without use of UE    Time 6    Period Weeks    Status Partially Met      PT LONG TERM GOAL #3  Title Pt will increase gait velocity without AD to >/= 2.62 ft/sec    Baseline 2.0 ft/sec without AD    Time 6    Period Weeks    Status Partially Met      PT LONG TERM GOAL #4   Title Pt will increase DGI to >/= 21/24 to indicate low falls risk with ambulation    Baseline 19/24    Time 6    Period Weeks    Status Partially Met       PT LONG TERM GOAL #5   Title Pt will ambulate x 500' over indoor and paved outdoor surfaces MOD I without AD and without LOB    Baseline walking outside in the yard without cane    Time 6    Period Weeks    Status Partially Met                 Plan - 07/29/20 1350    Clinical Impression Statement Performed assessement of progress towards LTG.  Pt has made excellent progress and has met FOTO goal but partially met all other goals.  Pt demonstrates improved functional LE strength and decreased falls risk as indicated by ability to perform five time sit to stand without use of UE but time did not decrease.  Pt also demonstrates improved balance with dynamic gait as indicated by improvement in gait velocity and DGI score without cane but not to specified goal level.  Pt feels she is back to her baseline and feels she will continue to use her cane when outside or in the community.  Pt reports she will continue to perform HEP.  Pt verbalized understanding of indications and process for returning to therapy if she were to experience a decline or wanted to continue to address balance and gait deficits.  Pt is safe for D/C at this time.    Personal Factors and Comorbidities Age;Comorbidity 3+    Comorbidities Blood transfusion, Kidney CA, Diverticulitis, foot surgery, hip surgery, loop recorder insertion, wrist fracture due to fall    Examination-Activity Limitations Bend;Locomotion Level;Stairs;Stand;Transfers    Examination-Participation Restrictions Cleaning;Driving;Community Activity;Laundry;Meal Prep;Shop    Stability/Clinical Decision Making Evolving/Moderate complexity    Rehab Potential Good    PT Frequency 2x / week    PT Duration 6 weeks    PT Treatment/Interventions ADLs/Self Care Home Management;DME Instruction;Gait training;Stair training;Functional mobility training;Therapeutic activities;Therapeutic exercise;Balance training;Neuromuscular re-education;Patient/family education    PT  Home Exercise Plan [email or web address]    Consulted and Agree with Plan of Care Patient           Patient will benefit from skilled therapeutic intervention in order to improve the following deficits and impairments:  Decreased balance,Decreased strength,Difficulty walking,Impaired sensation,Postural dysfunction  Visit Diagnosis: Hemiplegia and hemiparesis following cerebral infarction affecting left non-dominant side (HCC)  Other disturbances of skin sensation  Unsteadiness on feet  Difficulty in walking, not elsewhere classified  Abnormal posture     Problem List Patient Active Problem List   Diagnosis Date Noted  . Gait disturbance, post-stroke 06/20/2020  . Chronic neck pain 06/06/2020  . Constipation 06/06/2020  . 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  . Stroke (cerebrum) ([facility]) 05/31/2020  . Cerebral ischemia 05/27/2020  . Left leg weakness 05/27/2020  . Triquetral fracture 05/27/2020  . Acute CVA (cerebrovascular accident) ([facility]) 05/26/2020  . Left leg numbness 05/26/2020  . GERD (gastroesophageal reflux disease) 05/26/2020  . 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 Posterior vitreous detachment of both eyes 04/29/2020  .  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 Left epiretinal membrane 04/29/2020  . Pain in right knee 05/26/2018  . Bilateral lower abdominal pain 10/21/2010  . History of diverticulitis of colon 10/21/2010  . History of Clostridium difficile infection 10/21/2010    PHYSICAL THERAPY DISCHARGE SUMMARY  Visits from Start of Care: 7  Current functional level related to goals / functional outcomes: See above   Remaining deficits: Mild balance and gait impairments   Education / Equipment: HEP  Plan: Patient agrees to discharge.  Patient goals were partially met. Patient is being discharged due to being pleased with the current functional level.  ?????     Rico Junker,  PT, DPT 07/29/20    1:57 PM    [facility] [address] [place], [place], [address] Phone: [phone]   Fax:  [phone]  Name: MATEYA TORTI MRN: [id number] Date of Birth: [birth year]

## 2020-07-29 NOTE — Progress Notes (Addendum)
Subjective:    Patient ID: [name], female    DOB: 1941-05-29, 80 y.o.   MRN: [id number]  79 y.o. female with history of renal cancer, peripheral neuropathy, left hip fracture with left leg length discrepancy, chronic pain who was admitted on 05/26/2020 after 2 to 3 days of progressive numbness in LLE resulting in fall with inability to walk as well as left wrist fracture.  LLE weakness resolved in ED and patient did not receive TPA.  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  She did have recurrence and LLE weakness and stroke felt to be embolic due to unknown source.   Loop recorder was placed to rule out A. fib and Dr. Erlinda Hong recommended DAPT x3 months followed by aspirin alone.  Left wrist splint placed to manage fracture peripheral avulsion fracture with recommendations of NWB.  Chronic right shoulder pain treated with cortisone injection and improvement in pain.  She continues to be limited by pain neck pain, left-sided weakness with numbness as well as nonweightbearing on left wrist.  Therapy ongoing and CIR was recommended due to functional decline.Admit date: 05/31/2020 Discharge date: 06/06/2020  HPI Cc: Left posterior knee pain Patient was seen approximately 6 weeks ago at which time she was still ambulating a walker.  She is now walking with a cane, narrow-base quad.  Left posterior knee pain is mostly a bulge in the back of the knee and she states that it is not very painful.  She has had no anterior knee pain.  She has had no falls or trauma to the area.  She had a catch in her left hip over the weekend but this has resolved spontaneously.  She has seen physical therapy and has been discharged today due to excellent progress with her functional status. No falls , no redness in the knee  Pain Inventory Average Pain 2 Pain Right Now 2 My pain is  stabbing and aching  In the last 24 hours, has pain interfered with the following? General activity 2 Relation with others 2 Enjoyment of life 2 What TIME of day is your pain at its worst? daytime Sleep (in general) Good  Pain is worse with: walking Pain improves with: rest Relief from Meds: 6  Family History  Problem Relation Age of Onset  . Colon cancer Mother   . Coronary artery disease Father   . Breast cancer Neg Hx    Social History   Socioeconomic History  . Marital status: Married    Spouse name: Not on file  . Number of children: Not on file  . Years of education: Not on file  . Highest education level: Not on file  Occupational History  . Not on file  Tobacco Use  . Smoking status: Never Smoker  . Smokeless tobacco: Never Used  Substance and Sexual Activity  . Alcohol use: No  . Drug use: No  . Sexual activity: Not on file  Other Topics Concern  . Not on file  Social History Narrative  . Not on file   Social Determinants of Health   Financial Resource Strain: Not on file  Food Insecurity: Not on file  Transportation Needs: Not on file  Physical Activity: Not on file  Stress: Not on file  Social Connections: Not on file   Past Surgical History:  Procedure Laterality Date  . ABDOMINAL HYSTERECTOMY    .  COLON SURGERY     Severe sigmoid diverticulitis  . COLONOSCOPY    . FOOT SURGERY    . HIP SURGERY    . Kidney cancer     Kidney removed  . LOOP RECORDER INSERTION N/A 05/30/2020   Procedure: LOOP RECORDER INSERTION;  Surgeon: Vickie Epley, MD;  Location: [facility];  Service: Cardiovascular;  Laterality: N/A;  . POLYPECTOMY     Past Surgical History:  Procedure Laterality Date  . ABDOMINAL HYSTERECTOMY    . COLON SURGERY     Severe sigmoid diverticulitis  . COLONOSCOPY    . FOOT SURGERY    . HIP SURGERY    . Kidney cancer     Kidney removed  . LOOP RECORDER INSERTION N/A 05/30/2020   Procedure: LOOP RECORDER INSERTION;   Surgeon: Vickie Epley, MD;  Location: [facility];  Service: Cardiovascular;  Laterality: N/A;  . POLYPECTOMY     Past Medical History:  Diagnosis Date  . Blood transfusion without reported diagnosis    with hip fracture-80 years old  . Cancer [facility]) 1983   Kidney Cancer  . Diverticulosis   . GERD (gastroesophageal reflux disease)   . Personal history of kidney cancer    BP (!) 148/67   Pulse 76   Temp 98.3 F (36.8 C)   Ht 5\' 4"  (1.626 m)   Wt 156 lb 3.2 oz (70.9 kg)   SpO2 98%   BMI 26.81 kg/m   Opioid Risk Score:   Fall Risk Score:  `1  Depression screen PHQ 2/9  Depression screen PHQ 2/9 06/20/2020  Decreased Interest 0  Down, Depressed, Hopeless 0  PHQ - 2 Score 0    Review of Systems  Musculoskeletal:       Hip  All other systems reviewed and are negative.      Objective:   Physical Exam Vitals and nursing note reviewed.  Constitutional:      Appearance: She is normal weight.  HENT:     Head: Normocephalic and atraumatic.  Eyes:     Extraocular Movements: Extraocular movements intact.     Conjunctiva/sclera: Conjunctivae normal.     Pupils: Pupils are equal, round, and reactive to light.  Musculoskeletal:     Comments: Left knee has some fullness in the popliteal fossa no tenderness palpation no erythema small varicosities in the popliteal area. No pain with left knee range of motion No left knee instability No tenderness over the quadricep tendon or the patellar tendon. No pain with weightbearing  Neurological:     Mental Status: She is alert.     Comments: Motor strength is 4+ in the left deltoid bicep tricep grip hip flexor knee extensor ankle dorsiflexor 5/5 in the right deltoid bicep tricep grip hip flexor knee extensor ankle dorsiflexor There is a area of decreased sensation on the lateral aspect of the left knee otherwise sensation intact to light touch Tone is normal bilateral upper and lower limbs No evidence of dysarthria or  aphasia Ambulates with a small base quad cane no evidence of toe drag or knee instability. Visual fields are intact confrontation testing  Psychiatric:        Behavior: Behavior normal.   Leg length discrepancy built up shoe on the left approximately 1.75 inches        Assessment & Plan:  #1.  History of right frontal/parietal infarct with excellent improvement in strength.  Mild residual balance deficit as well as a small patch  of sensory deficit lateral knee area.  Overall functioning at a modified independent level.  Agree with discontinuation of physical therapy Follow-up with neurology.  She will continue on her Plavix plus aspirin for another month and then she will be on aspirin alone  #2.  Left knee popliteal fullness I suspect she has underlying osteoarthritis and this may represent a Baker's cyst.  She does not have any knee pain or knee deformities.  Will check x-rays.  Can perform a limited ultrasound in the popliteal area to evaluate for cystic structures in 1 month.  The patient does have a leg length discrepancy left leg being shorter than the right this may be playing into some of her musculoskeletal complaints on the left side.

## 2020-07-29 NOTE — Patient Instructions (Addendum)
Graduated return to driving instructions were provided. It is recommended that the patient first drives with another licensed driver in an empty parking lot. If the patient does well with this, and they can drive on a quiet street with the licensed driver. If the patient does well with this they can drive on a busy street with a licensed driver. If the patient does well with this, the next time out they can go by himself. For the first month after resuming driving, I recommend no nighttime or Interstate driving.   [facility] for Left knee xray [place]

## 2020-07-29 NOTE — Patient Instructions (Signed)
Access Code: [email or web address] URL: [email or web address] Date: 07/17/2020 Prepared by: Misty Stanley  Exercises Sit to Stand without Arm Support - 1 x daily - 5-7 x weekly - 1 sets - 10 reps Standing Marching - 1 x daily - 5-7 x weekly - 1 sets - 10 reps Tandem Stance with Support - 1 x daily - 5-7 x weekly - 1 sets - 3 reps - 15 secs hold Standing Hip Abduction with Counter Support - 1 x daily - 5-7 x weekly - 1 sets - 10 reps Romberg Stance with Eyes Closed - 1 x daily - 5-7 x weekly - 1 sets - 3 reps - 20 secs hold Wide Stance with Eyes Closed on Foam Pad - 1 x daily - 7 x weekly - 3 sets - 10 reps

## 2020-07-31 ENCOUNTER — Ambulatory Visit
Admission: RE | Admit: 2020-07-31 | Discharge: 2020-07-31 | Disposition: A | Discharge status: Home / Self Care | Payer: Medicare HMO | Source: Ambulatory Visit | Attending: Physical Medicine & Rehabilitation | Admitting: Physical Medicine & Rehabilitation

## 2020-07-31 ENCOUNTER — Ambulatory Visit: Payer: PPO

## 2020-07-31 DIAGNOSIS — M25462 Effusion, left knee: Secondary | ICD-10-CM

## 2020-07-31 DIAGNOSIS — M25562 Pain in left knee: Secondary | ICD-10-CM

## 2020-07-31 DIAGNOSIS — M1712 Unilateral primary osteoarthritis, left knee: Secondary | ICD-10-CM | POA: Diagnosis not present

## 2020-08-05 ENCOUNTER — Encounter: Payer: PPO | Admitting: Occupational Therapy

## 2020-08-05 ENCOUNTER — Telehealth: Payer: Self-pay | Admitting: *Deleted

## 2020-08-05 ENCOUNTER — Ambulatory Visit: Payer: Medicare HMO | Admitting: Physical Therapy

## 2020-08-05 NOTE — Telephone Encounter (Signed)
-----   Message from Charlett Blake, MD sent at 08/04/2020  2:55 PM EST ----- Mild arthritic changes and some evidence of meniscal cartilage degeneration.

## 2020-08-05 NOTE — Telephone Encounter (Signed)
I notified Mrs Radick.

## 2020-08-08 LAB — CUP PACEART REMOTE DEVICE CHECK
Date Time Interrogation Session: 20220224125033
Implantable Pulse Generator Implant Date: 20211217

## 2020-08-11 ENCOUNTER — Ambulatory Visit (INDEPENDENT_AMBULATORY_CARE_PROVIDER_SITE_OTHER): Payer: Medicare HMO

## 2020-08-11 DIAGNOSIS — I63031 Cerebral infarction due to thrombosis of right carotid artery: Secondary | ICD-10-CM

## 2020-08-12 ENCOUNTER — Encounter: Payer: PPO | Admitting: Occupational Therapy

## 2020-08-12 ENCOUNTER — Ambulatory Visit: Payer: PPO | Admitting: Physical Therapy

## 2020-08-14 ENCOUNTER — Encounter: Payer: PPO | Admitting: Occupational Therapy

## 2020-08-14 ENCOUNTER — Ambulatory Visit: Payer: PPO | Admitting: Physical Therapy

## 2020-08-18 NOTE — Progress Notes (Signed)
[facility] Summary Report / Loop Recorder 

## 2020-08-19 DIAGNOSIS — M25511 Pain in right shoulder: Secondary | ICD-10-CM | POA: Diagnosis not present

## 2020-08-19 DIAGNOSIS — M25512 Pain in left shoulder: Secondary | ICD-10-CM | POA: Diagnosis not present

## 2020-08-26 DIAGNOSIS — N183 Chronic kidney disease, stage 3 unspecified: Secondary | ICD-10-CM | POA: Diagnosis not present

## 2020-08-26 DIAGNOSIS — R232 Flushing: Secondary | ICD-10-CM | POA: Diagnosis not present

## 2020-08-26 DIAGNOSIS — R7303 Prediabetes: Secondary | ICD-10-CM | POA: Diagnosis not present

## 2020-08-26 DIAGNOSIS — L659 Nonscarring hair loss, unspecified: Secondary | ICD-10-CM | POA: Diagnosis not present

## 2020-08-28 ENCOUNTER — Ambulatory Visit: Payer: Medicare HMO | Admitting: Physical Medicine & Rehabilitation

## 2020-08-29 ENCOUNTER — Ambulatory Visit: Payer: Medicare HMO | Admitting: Physical Medicine & Rehabilitation

## 2020-09-04 DIAGNOSIS — C4441 Basal cell carcinoma of skin of scalp and neck: Secondary | ICD-10-CM | POA: Diagnosis not present

## 2020-09-04 DIAGNOSIS — L821 Other seborrheic keratosis: Secondary | ICD-10-CM | POA: Diagnosis not present

## 2020-09-04 DIAGNOSIS — L720 Epidermal cyst: Secondary | ICD-10-CM | POA: Diagnosis not present

## 2020-09-04 DIAGNOSIS — C44719 Basal cell carcinoma of skin of left lower limb, including hip: Secondary | ICD-10-CM | POA: Diagnosis not present

## 2020-09-04 DIAGNOSIS — L814 Other melanin hyperpigmentation: Secondary | ICD-10-CM | POA: Diagnosis not present

## 2020-09-04 DIAGNOSIS — Z85828 Personal history of other malignant neoplasm of skin: Secondary | ICD-10-CM | POA: Diagnosis not present

## 2020-09-04 DIAGNOSIS — D692 Other nonthrombocytopenic purpura: Secondary | ICD-10-CM | POA: Diagnosis not present

## 2020-09-04 DIAGNOSIS — L570 Actinic keratosis: Secondary | ICD-10-CM | POA: Diagnosis not present

## 2020-09-05 ENCOUNTER — Other Ambulatory Visit: Payer: Self-pay | Admitting: Family Medicine

## 2020-09-05 DIAGNOSIS — E2839 Other primary ovarian failure: Secondary | ICD-10-CM

## 2020-09-10 ENCOUNTER — Ambulatory Visit (INDEPENDENT_AMBULATORY_CARE_PROVIDER_SITE_OTHER): Payer: Medicare HMO

## 2020-09-10 DIAGNOSIS — I639 Cerebral infarction, unspecified: Secondary | ICD-10-CM

## 2020-09-10 LAB — CUP PACEART REMOTE DEVICE CHECK
Date Time Interrogation Session: 20220329125509
Implantable Pulse Generator Implant Date: 20211217

## 2020-09-11 ENCOUNTER — Encounter: Payer: Medicare HMO | Attending: Physical Medicine & Rehabilitation | Admitting: Physical Medicine & Rehabilitation

## 2020-09-11 ENCOUNTER — Encounter: Payer: Self-pay | Admitting: Physical Medicine & Rehabilitation

## 2020-09-11 ENCOUNTER — Other Ambulatory Visit: Payer: Self-pay

## 2020-09-11 VITALS — BP 150/96 | HR 74 | Temp 98.7°F | Ht 64.0 in | Wt 151.0 lb

## 2020-09-11 DIAGNOSIS — M25562 Pain in left knee: Secondary | ICD-10-CM | POA: Diagnosis not present

## 2020-09-11 DIAGNOSIS — Z8673 Personal history of transient ischemic attack (TIA), and cerebral infarction without residual deficits: Secondary | ICD-10-CM | POA: Diagnosis not present

## 2020-09-11 DIAGNOSIS — R269 Unspecified abnormalities of gait and mobility: Secondary | ICD-10-CM | POA: Insufficient documentation

## 2020-09-11 DIAGNOSIS — M25462 Effusion, left knee: Secondary | ICD-10-CM | POA: Diagnosis not present

## 2020-09-11 DIAGNOSIS — I69398 Other sequelae of cerebral infarction: Secondary | ICD-10-CM | POA: Diagnosis not present

## 2020-09-11 NOTE — Progress Notes (Signed)
Subjective:    Patient ID: [name], female    DOB: [birth year], 80 y.o.   MRN: [id number]  HPI 80 year old female with right ACA distribution CVA in December who completed inpatient rehabilitation now at a modified independent level.  Her main complaint is left knee pain.  She does have a left leg length discrepancy following a hip fracture and needs a built up shoe on the left side to make up for approximately 1.5 inches length. She has concerns of a bulge at the back of her knee on the left side.  This is not painful.  She has had no falls or trauma to the area.  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 CLINICAL DATA:  Left knee pain and swelling for the past month. No known injury.  EXAM: LEFT KNEE - 1-2 VIEW  COMPARISON:  None.  FINDINGS: Medial and lateral meniscal calcification. Mild posterior patellar and lateral spur formation. No effusion.  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 No acute abnormality.   Electronically Signed   By: Claudie Revering M.D.   On: 08/02/2020 16:06 Pain Inventory Average Pain 4 Pain Right Now 4 My pain is dull  In the last 24 hours, has pain interfered with the following? General activity 1 Relation with others 1 Enjoyment of life 1 What TIME of day is your pain at its worst? daytime Sleep (in general) Good  Pain is worse with: walking Pain improves with: medication Relief from Meds: 4  Family History  Problem Relation Age of Onset  . Colon cancer Mother   . Coronary artery disease Father   . Breast cancer Neg Hx    Social History   Socioeconomic History  . Marital status: Married    Spouse name: Not on file  . Number of children: Not on file  . Years of education: Not on file  . Highest education level: Not on file  Occupational History  .  Not on file  Tobacco Use  . Smoking status: Never Smoker  . Smokeless tobacco: Never Used  Substance and Sexual Activity  . Alcohol use: No  . Drug use: No  . Sexual activity: Not on file  Other Topics Concern  . Not on file  Social History Narrative  . Not on file   Social Determinants of Health   Financial Resource Strain: Not on file  Food Insecurity: Not on file  Transportation Needs: Not on file  Physical Activity: Not on file  Stress: Not on file  Social Connections: Not on file   Past Surgical History:  Procedure Laterality Date  . ABDOMINAL HYSTERECTOMY    . COLON SURGERY     Severe sigmoid diverticulitis  . COLONOSCOPY    . FOOT SURGERY    . HIP SURGERY    . Kidney cancer     Kidney removed  . LOOP RECORDER INSERTION N/A 05/30/2020   Procedure: LOOP RECORDER INSERTION;  Surgeon: Vickie Epley, MD;  Location: [facility];  Service: Cardiovascular;  Laterality: N/A;  . POLYPECTOMY     Past Surgical History:  Procedure Laterality Date  . ABDOMINAL HYSTERECTOMY    . COLON SURGERY     Severe sigmoid diverticulitis  . COLONOSCOPY    . FOOT SURGERY    . HIP SURGERY    . Kidney cancer     Kidney removed  .  LOOP RECORDER INSERTION N/A 05/30/2020   Procedure: LOOP RECORDER INSERTION;  Surgeon: Vickie Epley, MD;  Location: [facility];  Service: Cardiovascular;  Laterality: N/A;  . POLYPECTOMY     Past Medical History:  Diagnosis Date  . Blood transfusion without reported diagnosis    with hip fracture-80 years old  . Cancer [facility]) 1983   Kidney Cancer  . Diverticulosis   . GERD (gastroesophageal reflux disease)   . Personal history of kidney cancer    BP (!) 150/96   Pulse 74   Temp 98.7 F (37.1 C)   Ht 5\' 4"  (1.626 m)   Wt 151 lb (68.5 kg)   SpO2 96%   BMI 25.92 kg/m   Opioid Risk Score:   Fall Risk Score:  `1  Depression screen PHQ 2/9  Depression screen PHQ 2/9 06/20/2020  Decreased Interest 0  Down, Depressed,  Hopeless 0  PHQ - 2 Score 0    Review of Systems  Constitutional: Negative.   HENT: Negative.   Eyes: Negative.   Respiratory: Negative.   Cardiovascular: Negative.   Gastrointestinal: Negative.   Endocrine: Negative.   Genitourinary: Negative.   Musculoskeletal: Positive for arthralgias, back pain and gait problem.  Skin: Negative.   Allergic/Immunologic: Negative.   Hematological: Negative.   Psychiatric/Behavioral: Negative.   All other systems reviewed and are negative.      Objective:   Physical Exam Vitals and nursing note reviewed.  Constitutional:      Appearance: She is normal weight.  HENT:     Head: Normocephalic and atraumatic.  Eyes:     Extraocular Movements: Extraocular movements intact.     Conjunctiva/sclera: Conjunctivae normal.     Pupils: Pupils are equal, round, and reactive to light.  Skin:    General: Skin is warm and dry.  Neurological:     Mental Status: She is alert and oriented to person, place, and time.  Psychiatric:        Mood and Affect: Mood normal.        Behavior: Behavior normal.     There is some fullness at the left Pat gluteal fossa.  No tenderness palpation no tenderness along the medial or lateral joint line.  No crepitus with range of motion of the left knee. Motor strength is 4/5 in the left hip flexor knee extensor ankle dorsiflexor 5/5 in the right hip flexor knee extensor ankle dorsiflexor.      Assessment & Plan:  #1.  History of right ACA infarct with residual left lower extremity weakness which is mild the patient is back to modified independent level.  I will see the patient back in 3 to 6 months  #2.  Limited ultrasound of the left knee popliteal area there is evidence of an round 2 cm cystic structure with transverse imaging 4 Hz curvilinear transducer.  However with Doppler this appears to have mainly venous blood flow. No evidence of Baker's cyst.  We discussed that she could be referred to vascular surgery for  further evaluation of note is that she did have lower extremity Dopplers showing no evidence of DVT during her inpatient rehab hospitalization in December.  She has no swelling or pain in the left lower extremity to indicate signs of DVT.

## 2020-09-11 NOTE — Patient Instructions (Signed)
Continue home exercise program as outlined by PT

## 2020-09-23 NOTE — Progress Notes (Signed)
[facility] Summary Report / Loop Recorder 

## 2020-09-29 ENCOUNTER — Other Ambulatory Visit: Payer: Self-pay | Admitting: Physical Medicine and Rehabilitation

## 2020-10-13 ENCOUNTER — Ambulatory Visit (INDEPENDENT_AMBULATORY_CARE_PROVIDER_SITE_OTHER): Payer: Medicare HMO

## 2020-10-13 DIAGNOSIS — I63031 Cerebral infarction due to thrombosis of right carotid artery: Secondary | ICD-10-CM | POA: Diagnosis not present

## 2020-10-13 LAB — CUP PACEART REMOTE DEVICE CHECK
Date Time Interrogation Session: 20220501125512
Implantable Pulse Generator Implant Date: 20211217

## 2020-10-17 DIAGNOSIS — N183 Chronic kidney disease, stage 3 unspecified: Secondary | ICD-10-CM | POA: Diagnosis not present

## 2020-10-17 DIAGNOSIS — E785 Hyperlipidemia, unspecified: Secondary | ICD-10-CM | POA: Diagnosis not present

## 2020-10-17 DIAGNOSIS — C641 Malignant neoplasm of right kidney, except renal pelvis: Secondary | ICD-10-CM | POA: Diagnosis not present

## 2020-10-17 DIAGNOSIS — K219 Gastro-esophageal reflux disease without esophagitis: Secondary | ICD-10-CM | POA: Diagnosis not present

## 2020-10-29 ENCOUNTER — Other Ambulatory Visit: Payer: Self-pay | Admitting: Physical Medicine and Rehabilitation

## 2020-10-30 NOTE — Progress Notes (Signed)
[facility] Summary Report / Loop Recorder 

## 2020-11-06 ENCOUNTER — Encounter: Payer: Self-pay | Admitting: Adult Health

## 2020-11-06 ENCOUNTER — Ambulatory Visit: Payer: Medicare HMO | Admitting: Adult Health

## 2020-11-06 VITALS — BP 124/73 | HR 76 | Ht 65.0 in | Wt 154.0 lb

## 2020-11-06 DIAGNOSIS — E785 Hyperlipidemia, unspecified: Secondary | ICD-10-CM | POA: Diagnosis not present

## 2020-11-06 DIAGNOSIS — I639 Cerebral infarction, unspecified: Secondary | ICD-10-CM

## 2020-11-06 DIAGNOSIS — G5602 Carpal tunnel syndrome, left upper limb: Secondary | ICD-10-CM | POA: Diagnosis not present

## 2020-11-06 NOTE — Patient Instructions (Signed)
Continue aspirin 81 mg daily  and atorvastatin for secondary stroke prevention  Your loop recorder has not shown atrial fibrillation thus far  Continue to follow up with PCP regarding cholesterol management  Maintain strict control of cholesterol with LDL cholesterol (bad cholesterol) goal below 70 mg/dL.   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  If your symptoms worsen, we may have to proceed with doing EMG/NCV for further evaluation     Followup in the future with me in 6 months or call earlier if needed     Thank you for coming to see [place] at [facility]. I hope we have been able to provide you high quality care today.  You may receive a patient satisfaction survey over the next few weeks. We would appreciate your feedback and comments so that we may continue to improve ourselves and the health of our patients.   Carpal Tunnel Syndrome  Carpal tunnel syndrome is a condition that causes pain, numbness, and weakness in your hand and fingers. The carpal tunnel is a narrow area located on the palm side of your wrist. Repeated wrist motion or certain diseases may cause swelling within the tunnel. This swelling pinches the main nerve in the wrist. The main nerve in the wrist is called the median nerve. What are the causes? This condition may be caused by:  Repeated and forceful wrist and hand  motions.  Wrist injuries.  Arthritis.  A cyst or tumor in the carpal tunnel.  Fluid buildup during pregnancy.  Use of tools that vibrate. Sometimes the cause of this condition is not known. What increases the risk? The following factors may make you more likely to develop this condition:  Having a job that  requires you to repeatedly or forcefully move your wrist or hand or requires you to use tools that vibrate. This may include jobs that involve using computers, working on an [employer], or working with [place] such as Pension scheme manager.  Being a woman.  Having certain conditions, such as: ? Diabetes. ? Obesity. ? An underactive thyroid (hypothyroidism). ? Kidney failure. ? Rheumatoid arthritis. What are the signs or symptoms? Symptoms of this condition include:  A tingling feeling in your fingers, especially in your thumb, index, and middle fingers.  Tingling or numbness in your hand.  An aching feeling in your entire arm, especially when your wrist and elbow are bent for a long time.  Wrist pain that goes up your arm to your shoulder.  Pain that goes down into your palm or fingers.  A weak feeling in your hands. You may have trouble grabbing and holding items. Your symptoms may feel worse during the night. How is this diagnosed? This condition is diagnosed with a medical history and physical exam. You may also have tests, including:  Electromyogram (EMG). This test measures electrical signals sent by your nerves into the muscles.  Nerve conduction study. This test measures how well electrical signals pass through your nerves.  Imaging tests, such as X-rays, ultrasound, and MRI. These tests check for possible causes of your condition. How is this treated? This condition may be treated with:  Lifestyle changes. It is important to stop or change the activity that caused your  condition.  Doing exercise and activities to strengthen and stretch your muscles and tendons (physical therapy).  Making lifestyle changes to help with your condition and learning how to do your daily activities safely (occupational therapy).  Medicines for pain and inflammation. This may include medicine that is injected into your wrist.  A wrist splint or brace.  Surgery. Follow these  instructions at home: If you have a splint or brace:  Wear the splint or brace as told by your health care provider. Remove it only as told by your health care provider.  Loosen the splint or brace if your fingers tingle, become numb, or turn cold and blue.  Keep the splint or brace clean.  If the splint or brace is not waterproof: ? Do not let it get wet. ? Cover it with a watertight covering when you take a bath or shower. Managing pain, stiffness, and swelling If directed, put ice on the painful area. To do this:  If you have a removeable splint or brace, remove it as told by your health care provider.  Put ice in a plastic bag.  Place a towel between your skin and the bag or between the splint or brace and the bag.  Leave the ice on for 20 minutes, 2-3 times a day. Do not fall asleep with the cold pack on your skin.  Remove the ice if your skin turns bright red. This is very important. If you cannot feel pain, heat, or cold, you have a greater risk of damage to the area. Move your fingers often to reduce stiffness and swelling.   General instructions  Take over-the-counter and prescription medicines only as told by your health care provider.  Rest your wrist and hand from any activity that may be causing your pain. If your condition is work related, talk with your employer about changes that can be made, such as getting a wrist pad to use while typing.  Do any exercises as told by your health care provider, physical therapist, or occupational therapist.  Keep all follow-up visits. This is important. Contact a health care provider if:  You have new symptoms.  Your pain is not controlled with medicines.  Your symptoms get worse. Get help right away if:  You have severe numbness or tingling in your wrist or hand. Summary  Carpal tunnel syndrome is a condition that causes pain, numbness, and weakness in your hand and fingers.  It is usually caused by repeated wrist  motions.  Lifestyle changes and medicines are used to treat carpal tunnel syndrome. Surgery may be recommended.  Follow your health care provider's instructions about wearing a splint, resting from activity, keeping follow-up visits, and calling for help. This information is not intended to replace advice given to you by your health care provider. Make sure you discuss any questions you have with your health care provider. Document Revised: 10/11/2019 Document Reviewed: 10/11/2019 Elsevier Patient Education  [place].

## 2020-11-06 NOTE — Progress Notes (Signed)
[facility] [address] [place]. [place] [address] [phone]       STROKE FOLLOW UP NOTE  [name] Date of Birth:  08-06-1940 Medical Record Number:  [id number]   Reason for Referral: stroke follow up    SUBJECTIVE:   CHIEF COMPLAINT:  Chief Complaint  Patient presents with  . Follow-up    Tr alone Pt is well, has some imbalance/ gait complications. L arm has some tingling     HPI:   Today, 11/06/2020, [name] returns for 12-month stroke follow-up unaccompanied  Reports residual mild gait impairment with imbalance but overall improvement since prior visit. Completed therapy and currently ambulating with cane without any recent falls.  She does continue to experience LLE numbness distally and ankle weakness but this has been gradually improving as well. Denies new stroke/TIA symptoms  Compliant on aspirin and atorvastatin without associated side effects Blood pressure today 124/73 Loop recorder has not shown atrial fibrillation thus far  She has multiple other concerns including left hand numbness typically present upon awakening with some improvement since using brace (present since wrist fracture), right shoulder pain being followed by orthopedics, left leg biopsy that was done in March that still has not completely healed and continued hair loss.  No further concerns at this time    History provided for reference purposes only Initial visit 07/08/2020 JM: [name] is being seen for hospital follow-up unaccompanied.  She was discharged home from CIR on 06/06/2020 after a 6-day stay. She reports residual mild left ankle weakness, LLE pain and gait impairment.  Currently working with neuro rehab PT/OT with ongoing improvement.  She does admit to chronic gait impairment due to history of hip fracture at young age resulting in shorter left leg requiring platform shoe. She does believe her current gait ability is close to baseline with use of a  cane for ambulation.  Denies new or worsening stroke/TIA symptoms. She has remained on DAPT with mild bruising but no bleeding. Remains on atorvastatin 40mg  daily without myalgias. Blood pressure today 129/76. Loop recorder has not shown atrial fibrillation thus far. Reports being on estrogen for postmenopausal hot flashes but discontinued during recent admission.  She has since been having issues with hot flashes and questions other treatment options.  No further concerns at this time.  Stroke admission 05/26/2020 [name] is a 80 y.o. female with history of kidney cancer (nephrectomy), previous stroke by imaging, diverticulosis, idiopathic peripheral neuropathy, and hip fracture at the age of 56 from [place] (left leg now shorter than right) presented to [facility] ED on 05/26/2020 with several day hx of LLE numbness / weakness resulting in a fall with left wrist fracture. Personally reviewed hospitalization pertinent progress notes, lab work and imaging with summary provided.  Evaluated by Dr. Erlinda Hong with stroke work-up revealing multifocal acute ischemia within the right ACA territory with right A2 ACA occlusion, embolic secondary to unknown source.  2D echo normal EF without cardiac source of embolus identified and LE Doppler negative for DVT.  Recommended loop recorder placement to rule out A. fib as potential etiology.  Recommended DAPT for 3 months then aspirin alone given right ICA large vessel occlusion.  LDL 143 and initiate atorvastatin 40 mg daily.  No prior history of HTN or DM with A1c 5.8.  Other stroke risk factors include advanced age and history of stroke on imaging.  Other active problems include left wrist fracture s/p fall.  Evaluated by therapies and discharged  to CIR on 05/31/2020 as recommended for ongoing therapy needs.   Stroke: Multifocal acute ischemia within the right ACA territory - embolic - source unclear.  CT head - No CT evidence for acute intracranial abnormality. Chronic right  cerebellar infarct. Mild chronic small vessel ischemic change of the white matter. 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   MRI head -  Multifocal acute ischemia within the right hemisphere, predominantly within the anterior frontal lobe and medial parietal lobe. 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   MRA head - Right A2 ACA occlusion. Areas of infarction on prior study could reflect watershed ischemia. Moderate right supraclinoid ICA stenosis.   MRA Neck - Normal MRA of the neck.   2D Echo - EF 60 - 65%. No cardiac source of emboli identified.   LE venous doppler neg for DVT  Loop recorder placed 05/30/2020  [name] Virus 2 - negative  LDL - 143   HGBA1C - 5.8  VTE prophylaxis - SCDs  No antithrombotic prior to admission, now on aspirin 81 mg daily and clopidogrel 75 mg daily DAPT for 3 months and then ASA alone given right ACA large vessel occlusion.  Patient counseled to be compliant with her antithrombotic medications  Ongoing aggressive stroke risk factor management  Therapy recommendations:  CIR  Disposition:  CIR     ROS:   14 system review of systems performed and negative with exception of those listed in HPI  PMH:  Past Medical History:  Diagnosis Date  . Blood transfusion without reported diagnosis    with hip fracture-80 years old  . Cancer [facility]) 1983   Kidney Cancer  . Diverticulosis   . GERD (gastroesophageal reflux disease)   . Personal history of kidney cancer     PSH:  Past Surgical History:  Procedure Laterality Date  . ABDOMINAL HYSTERECTOMY    . COLON SURGERY     Severe sigmoid diverticulitis  . COLONOSCOPY    . FOOT SURGERY    . HIP SURGERY    . Kidney cancer     Kidney removed  . LOOP RECORDER INSERTION N/A 05/30/2020   Procedure: LOOP RECORDER INSERTION;  Surgeon: Vickie Epley, MD;  Location: [facility];  Service: Cardiovascular;  Laterality: N/A;  . POLYPECTOMY      Social History:  Social History   Socioeconomic History  . Marital status: Married    Spouse name: Not on file  . Number of children: Not on file  . Years of education: Not on file  . Highest education level: Not on file  Occupational History  . Not on file  Tobacco Use  . Smoking status: Never Smoker  . Smokeless tobacco: Never Used  Substance and Sexual Activity  . Alcohol use: No  . Drug use: No  . Sexual activity: Not on file  Other Topics Concern  . Not on file  Social History Narrative  . Not on file   Social Determinants of Health   Financial Resource Strain: Not on file  Food Insecurity: Not on file  Transportation Needs: Not on file  Physical Activity: Not on file  Stress: Not on file  Social Connections: Not on file  Intimate Partner Violence: Not on file    Family History:  Family History  Problem Relation Age of Onset  . Colon cancer Mother   . Coronary artery disease Father   .  Breast cancer Neg Hx     Medications:   Current Outpatient Medications on File Prior to Visit  Medication Sig Dispense Refill  . acetaminophen (TYLENOL) 650 MG CR tablet Take 650 mg by mouth daily.    Marland Kitchen aspirin EC 81 MG EC tablet Take 1 tablet (81 mg total) by mouth daily. Swallow whole. 30 tablet 0  . atorvastatin (LIPITOR) 40 MG tablet Take 40 mg by mouth daily.    . cyclobenzaprine (FLEXERIL) 5 MG tablet TAKE ONE TABLET BY MOUTH THREE TIMES A DAY AS NEEDED FOR MUSCLE SPASMS 30 tablet 0  . lidocaine (LIDODERM) 5 % Place 2 patches onto the skin daily. On for 12 hours and has to be off for 12 hrs. Purchase this over the counter. 30 patch 0  . MAGNESIUM PO Take 1 tablet by mouth every evening.    . Multiple Vitamins-Minerals (MULTIVITAMIN WITH MINERALS) tablet Take 1 tablet by mouth daily.    . pantoprazole (PROTONIX) 40 MG tablet Take 1 tablet (40 mg total) by mouth daily. 30 tablet 0  . polyethylene  glycol (MIRALAX / GLYCOLAX) 17 g packet Take 17 g by mouth daily. 30 each 0  . senna-docusate (SENOKOT-S) 8.6-50 MG tablet Take 1 tablet by mouth 2 (two) times daily. 30 tablet 0  . vitamin C (ASCORBIC ACID) 500 MG tablet Take 500 mg by mouth daily.     No current facility-administered medications on file prior to visit.    Allergies:   Allergies  Allergen Reactions  . Codeine Other (See Comments)    Pt unsure if really allergic to this      OBJECTIVE:  Physical Exam  Vitals:   11/06/20 1333  BP: 124/73  Pulse: 76  Weight: 154 lb (69.9 kg)  Height: 5\' 5"  (1.651 m)   Body mass index is 25.63 kg/m. No exam data present  General: well developed, well nourished, very pleasant elderly Caucasian female, seated, in no evident distress Head: head normocephalic and atraumatic.   Neck: supple with no carotid or supraclavicular bruits Cardiovascular: regular rate and rhythm, no murmurs Musculoskeletal: Shorter left leg with use of platform shoe Skin:  no rash/petichiae; LLE shin/ankle small round wound s/p biopsy  Vascular:  Normal pulses all extremities; multiple varicosities BLE   Neurologic Exam Mental Status: Awake and fully alert. Fluent speech and language. Oriented to place and time. Recent and remote memory intact. Attention span, concentration and fund of knowledge appropriate. Mood and affect appropriate.  Cranial Nerves: Pupils equal, briskly reactive to light. Extraocular movements full without nystagmus. Visual fields full to confrontation. Hearing intact. Facial sensation intact. Face, tongue, palate moves normally and symmetrically.  Motor: Normal bulk and tone. Normal strength in all tested extremity muscles except chronic LLE 4/5 hip flexor and ankle dorsiflexion Sensory.: intact to touch , pinprick , position and vibratory sensation.  Coordination: Rapid alternating movements normal in all extremities. Finger-to-nose and heel-to-shin performed accurately  bilaterally. Gait and Station: Arises from chair without difficulty. Stance is normal.  Gait abnormality with decreased step length and wide-based gait with use of cane and left platform shoe Reflexes: 1+ and symmetric. Toes downgoing.       ASSESSMENT: JAYLINN HELLENBRAND is a 80 y.o. year old female with multifocal acute ischemia within the right ACA territory with right A2 ACA occlusion, embolic secondary to unknown source on 05/26/2020 after presenting with several day history of LLE numbness/weakness resulting in a fall with left wrist fracture. Vascular risk factors include HLD, chronic  right cerebellar infarct and advanced age.      PLAN:  1. R ACA stroke, cryptogenic:  a. Residual deficit: Gait impairment and LLE weakness -gradually improving since prior visit.  Discussed use of cane at times unless otherwise instructed b. Continue aspirin 81 mg daily and atorvastatin 40 mg daily for secondary stroke prevention c. Loop recorder has not shown atrial fibrillation thus far -monthly download reports personally reviewed d. Discussed secondary stroke prevention measures and importance of close PCP follow up for aggressive stroke risk factor management  2. HLD: LDL goal <70.  Continue atorvastatin 40 mg daily - has scheduled f/u with PCP next month and plans on obtaining lab work at that time 3. 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  Recommend conservative management with use of brace but advised to call with any worsening and may proceed with EMG/NCV    Follow up in 6 months or call earlier if needed   CC:  [facility] provider: Dr. Elita Boone, Bill Salinas, MD    Frann Rider, [facility]  [facility] [address] [place], [place] [address]  Phone [phone] Fax [phone] Note: This document was prepared with digital dictation and possible smart phrase technology. Any  transcriptional errors that result from this process are unintentional.

## 2020-11-06 NOTE — Progress Notes (Signed)
I agree with the above plan 

## 2020-11-11 DIAGNOSIS — M25512 Pain in left shoulder: Secondary | ICD-10-CM | POA: Diagnosis not present

## 2020-11-11 DIAGNOSIS — M7541 Impingement syndrome of right shoulder: Secondary | ICD-10-CM | POA: Diagnosis not present

## 2020-11-11 DIAGNOSIS — G5603 Carpal tunnel syndrome, bilateral upper limbs: Secondary | ICD-10-CM | POA: Diagnosis not present

## 2020-11-16 LAB — CUP PACEART REMOTE DEVICE CHECK
Date Time Interrogation Session: 20220603125034
Implantable Pulse Generator Implant Date: 20211217

## 2020-11-17 ENCOUNTER — Ambulatory Visit (INDEPENDENT_AMBULATORY_CARE_PROVIDER_SITE_OTHER): Payer: Medicare HMO

## 2020-11-17 DIAGNOSIS — I639 Cerebral infarction, unspecified: Secondary | ICD-10-CM

## 2020-11-18 ENCOUNTER — Telehealth: Payer: Self-pay | Admitting: Adult Health

## 2020-11-18 NOTE — Telephone Encounter (Signed)
Pt called stating that she is to have carpal tunnel surgery and is wanting to know if the provider thinks it is ok to be put under anesthesia after her having a stroke. Please advise.

## 2020-11-18 NOTE — Telephone Encounter (Signed)
I spoke to patient see she said she had a stroke in December 2021.  She has been doing fine. She is now on 81 mg aspirin daily.  She is due to have a nerve conduction EMG study this week for a upcoming carpal tunnel procedure which she will be put under anesthesia.  There is no date set yet.  I relayed to have Dr. Linda Hedges office send [place] a clearance form to be filled out prior to her procedure.  She verbalized understanding and will let us know.

## 2020-11-19 DIAGNOSIS — G5602 Carpal tunnel syndrome, left upper limb: Secondary | ICD-10-CM | POA: Diagnosis not present

## 2020-11-25 ENCOUNTER — Telehealth: Payer: Self-pay | Admitting: Adult Health

## 2020-11-25 NOTE — Telephone Encounter (Signed)
Pt called, Dr. Thompson Caul office said I need a referral from [place] before I could schedule an appt with the cardiology. Would like a call from the nurse.

## 2020-11-25 NOTE — Telephone Encounter (Signed)
I spoke to the patient.  She sees Dr. Linda Hedges on 6/21 for discussing carpal tunnel surgery.  She is concerned about her cardiac and neuro stroke history I relayed we do clearance on that and being 6 months out was a good thing in regards to any upcoming surgery. will let Janett Billow know and will respond back when her messages addressed .patient verbalized understanding and appreciation.  Asked about referral for cardiology.  She has loop recorder will call and see if that will make her appt with cardiology.  Her pcp (new) has not seen yet.  Concerned about having surgery under general anesthesia. Vs in office procedure.

## 2020-11-25 NOTE — Telephone Encounter (Signed)
See other phone note

## 2020-11-25 NOTE — Telephone Encounter (Signed)
Pt called and LVM stating she is having surgery and has questions about a surgery she has coming up. Pt did not give any other details except wanting a call back from provider. Please advise.

## 2020-12-02 DIAGNOSIS — M67432 Ganglion, left wrist: Secondary | ICD-10-CM | POA: Diagnosis not present

## 2020-12-02 DIAGNOSIS — R079 Chest pain, unspecified: Secondary | ICD-10-CM | POA: Diagnosis not present

## 2020-12-02 DIAGNOSIS — M25532 Pain in left wrist: Secondary | ICD-10-CM | POA: Diagnosis not present

## 2020-12-02 DIAGNOSIS — G5602 Carpal tunnel syndrome, left upper limb: Secondary | ICD-10-CM | POA: Diagnosis not present

## 2020-12-02 NOTE — Telephone Encounter (Signed)
Contacted pt to FU, informed her that [name] Billow do not have any concern with her proceeding with study or procedure in regards to stroke standpoint as her stroke occurred 05/2020 and has been doing well since that time.

## 2020-12-02 NOTE — Telephone Encounter (Signed)
Is patient planning on having EMG/NCV (as stated in prior note) or carpal tunnel release surgery?  I do not have any concern with her proceeding with study or procedure in regards to stroke standpoint as her stroke occurred 05/2020 and has been doing well since that time.  I am not sure why she wants referral to cardiology as she has no cardiac history.  If she is wanting this for clearance to go under general anesthesia usually the surgeon will recommend this and refer her to cardiology at that time for clearance.

## 2020-12-08 NOTE — Progress Notes (Signed)
[facility] Summary Report / Loop Recorder 

## 2020-12-12 DIAGNOSIS — Z23 Encounter for immunization: Secondary | ICD-10-CM | POA: Diagnosis not present

## 2020-12-21 LAB — CUP PACEART REMOTE DEVICE CHECK
Date Time Interrogation Session: 20220706125120
Implantable Pulse Generator Implant Date: 20211217

## 2020-12-22 ENCOUNTER — Ambulatory Visit (INDEPENDENT_AMBULATORY_CARE_PROVIDER_SITE_OTHER): Payer: Medicare HMO

## 2020-12-22 DIAGNOSIS — I639 Cerebral infarction, unspecified: Secondary | ICD-10-CM

## 2020-12-23 DIAGNOSIS — M25512 Pain in left shoulder: Secondary | ICD-10-CM | POA: Diagnosis not present

## 2020-12-23 DIAGNOSIS — G5602 Carpal tunnel syndrome, left upper limb: Secondary | ICD-10-CM | POA: Diagnosis not present

## 2020-12-23 DIAGNOSIS — M67432 Ganglion, left wrist: Secondary | ICD-10-CM | POA: Diagnosis not present

## 2020-12-23 DIAGNOSIS — Z0000 Encounter for general adult medical examination without abnormal findings: Secondary | ICD-10-CM | POA: Diagnosis not present

## 2020-12-23 DIAGNOSIS — Z1389 Encounter for screening for other disorder: Secondary | ICD-10-CM | POA: Diagnosis not present

## 2020-12-24 DIAGNOSIS — Z6826 Body mass index (BMI) 26.0-26.9, adult: Secondary | ICD-10-CM | POA: Diagnosis not present

## 2020-12-24 DIAGNOSIS — E785 Hyperlipidemia, unspecified: Secondary | ICD-10-CM | POA: Diagnosis not present

## 2020-12-24 DIAGNOSIS — Z23 Encounter for immunization: Secondary | ICD-10-CM | POA: Diagnosis not present

## 2020-12-24 DIAGNOSIS — R7301 Impaired fasting glucose: Secondary | ICD-10-CM | POA: Diagnosis not present

## 2020-12-24 DIAGNOSIS — N183 Chronic kidney disease, stage 3 unspecified: Secondary | ICD-10-CM | POA: Diagnosis not present

## 2021-01-06 DIAGNOSIS — U071 COVID-19: Secondary | ICD-10-CM | POA: Diagnosis not present

## 2021-01-08 DIAGNOSIS — C641 Malignant neoplasm of right kidney, except renal pelvis: Secondary | ICD-10-CM | POA: Diagnosis not present

## 2021-01-08 DIAGNOSIS — N189 Chronic kidney disease, unspecified: Secondary | ICD-10-CM | POA: Diagnosis not present

## 2021-01-08 DIAGNOSIS — N183 Chronic kidney disease, stage 3 unspecified: Secondary | ICD-10-CM | POA: Diagnosis not present

## 2021-01-08 DIAGNOSIS — E785 Hyperlipidemia, unspecified: Secondary | ICD-10-CM | POA: Diagnosis not present

## 2021-01-08 DIAGNOSIS — K219 Gastro-esophageal reflux disease without esophagitis: Secondary | ICD-10-CM | POA: Diagnosis not present

## 2021-01-12 NOTE — Progress Notes (Signed)
[facility] Summary Report / Loop Recorder 

## 2021-01-13 DIAGNOSIS — M67432 Ganglion, left wrist: Secondary | ICD-10-CM | POA: Diagnosis not present

## 2021-01-13 DIAGNOSIS — G5602 Carpal tunnel syndrome, left upper limb: Secondary | ICD-10-CM | POA: Diagnosis not present

## 2021-01-16 ENCOUNTER — Other Ambulatory Visit: Payer: Self-pay | Admitting: *Deleted

## 2021-01-16 MED ORDER — CYCLOBENZAPRINE HCL 5 MG PO TABS: ORAL_TABLET | ORAL | 0 refills | Status: DC

## 2021-01-17 DIAGNOSIS — U071 COVID-19: Secondary | ICD-10-CM | POA: Diagnosis not present

## 2021-01-17 DIAGNOSIS — J3489 Other specified disorders of nose and nasal sinuses: Secondary | ICD-10-CM | POA: Diagnosis not present

## 2021-01-23 DIAGNOSIS — J019 Acute sinusitis, unspecified: Secondary | ICD-10-CM | POA: Diagnosis not present

## 2021-01-25 DIAGNOSIS — E785 Hyperlipidemia, unspecified: Secondary | ICD-10-CM | POA: Diagnosis not present

## 2021-01-25 DIAGNOSIS — K219 Gastro-esophageal reflux disease without esophagitis: Secondary | ICD-10-CM | POA: Diagnosis not present

## 2021-01-25 DIAGNOSIS — C641 Malignant neoplasm of right kidney, except renal pelvis: Secondary | ICD-10-CM | POA: Diagnosis not present

## 2021-01-25 DIAGNOSIS — N189 Chronic kidney disease, unspecified: Secondary | ICD-10-CM | POA: Diagnosis not present

## 2021-01-25 DIAGNOSIS — N183 Chronic kidney disease, stage 3 unspecified: Secondary | ICD-10-CM | POA: Diagnosis not present

## 2021-01-26 ENCOUNTER — Ambulatory Visit (INDEPENDENT_AMBULATORY_CARE_PROVIDER_SITE_OTHER): Payer: Medicare HMO

## 2021-01-26 ENCOUNTER — Other Ambulatory Visit: Payer: Self-pay | Admitting: Family Medicine

## 2021-01-26 DIAGNOSIS — Z1231 Encounter for screening mammogram for malignant neoplasm of breast: Secondary | ICD-10-CM

## 2021-01-26 DIAGNOSIS — I639 Cerebral infarction, unspecified: Secondary | ICD-10-CM

## 2021-01-26 LAB — CUP PACEART REMOTE DEVICE CHECK
Date Time Interrogation Session: 20220808125337
Implantable Pulse Generator Implant Date: 20211217

## 2021-02-04 NOTE — Progress Notes (Signed)
Cardiology Office Note:    Date:  02/06/2021   ID:  ELYCIA ROTHSCHILD, DOB Apr 27, 1941, MRN [id number]  PCP:  No primary care provider on file.  Cardiologist:  None   Referring MD: Netta Cedars, MD   Chief Complaint  Patient presents with   Chest Pain     History of Present Illness:    GUADLUPE HOLTAN is a 80 y.o. female with no hx of heart disease referred for evaluation of chest pain by Dr. Veverly Fells.  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 Quentin Ore. Other problems include hyperlipidemia, GERD, and h/o kidney cancer.  Bending, and cleaning.  There is no associated palpitation, chest discomfort, dyspnea, or exertional/aerobic component.  Does not occur at rest.  Only occurs with certain movements.  Past Medical History:  Diagnosis Date   Blood transfusion without reported diagnosis    with hip fracture-80 years old   Cancer [facility]) 1983   Kidney Cancer   Diverticulosis    GERD (gastroesophageal reflux disease)    Personal history of kidney cancer     Past Surgical History:  Procedure Laterality Date   ABDOMINAL HYSTERECTOMY     COLON SURGERY     Severe sigmoid diverticulitis   COLONOSCOPY     FOOT SURGERY     HIP SURGERY     Kidney cancer     Kidney removed   LOOP RECORDER INSERTION N/A 05/30/2020   Procedure: LOOP RECORDER INSERTION;  Surgeon: Vickie Epley, MD;  Location: [facility];  Service: Cardiovascular;  Laterality: N/A;   POLYPECTOMY      Current Medications: Current Meds  Medication Sig   acetaminophen (TYLENOL) 650 MG CR tablet Take 650 mg by mouth daily.   aspirin EC 81 MG EC tablet Take 1 tablet (81 mg total) by mouth daily. Swallow whole.   atorvastatin (LIPITOR) 40 MG tablet Take 40 mg by mouth daily.   cyclobenzaprine (FLEXERIL) 5 MG tablet TAKE ONE TABLET BY MOUTH THREE TIMES A DAY AS NEEDED FOR MUSCLE SPASMS   lidocaine (LIDODERM) 5 % Place 2 patches onto the skin daily. On for 12 hours and has to be off for 12  hrs. Purchase this over the counter.   MAGNESIUM PO Take 400 mg by mouth every evening.   Multiple Vitamins-Minerals (MULTIVITAMIN WITH MINERALS) tablet Take 1 tablet by mouth daily.   pantoprazole (PROTONIX) 40 MG tablet Take 1 tablet (40 mg total) by mouth daily.   PARoxetine (PAXIL) 10 MG tablet Take 10 mg by mouth daily.   polyethylene glycol (MIRALAX / GLYCOLAX) 17 g packet Take 17 g by mouth daily.   senna-docusate (SENOKOT-S) 8.6-50 MG tablet Take 1 tablet by mouth 2 (two) times daily.   vitamin C (ASCORBIC ACID) 500 MG tablet Take 500 mg by mouth daily.     Allergies:   Codeine   Social History   Socioeconomic History   Marital status: Married    Spouse name: Not on file   Number of children: Not on file   Years of education: Not on file   Highest education level: Not on file  Occupational History   Not on file  Tobacco Use   Smoking status: Never   Smokeless tobacco: Never  Substance and Sexual Activity   Alcohol use: No   Drug use: No   Sexual activity: Not on file  Other Topics Concern   Not on file  Social History Narrative   Not on file  Social Determinants of Health   Financial Resource Strain: Not on file  Food Insecurity: Not on file  Transportation Needs: Not on file  Physical Activity: Not on file  Stress: Not on file  Social Connections: Not on file     Family History: The patient's family history includes Colon cancer in her mother; Coronary artery disease in her father. There is no history of Breast cancer.  ROS:   Please see the history of present illness.    Has history of vascular disease.  Prior embolic CVA.  Currently on aspirin.  Plavix has been discontinued.  All other systems reviewed and are negative.  EKGs/Labs/Other Studies Reviewed:    The following studies were reviewed today:    ECHOCARDIOGRAM REPORT 05/27/2020: IMPRESSIONS   1. Left ventricular ejection fraction, by estimation, is 60 to 65%. The  left ventricle has normal  function. The left ventricle has no regional  wall motion abnormalities. Left ventricular diastolic parameters are  consistent with Grade I diastolic  dysfunction (impaired relaxation).   2. Right ventricular systolic function is normal. The right ventricular  size is normal. There is normal pulmonary artery systolic pressure.   3. The mitral valve is normal in structure. Trivial mitral valve  regurgitation.   4. The aortic valve is tricuspid. There is mild calcification of the  aortic valve. There is mild thickening of the aortic valve. Aortic valve  regurgitation is not visualized. Mild aortic valve sclerosis is present,  with no evidence of aortic valve  stenosis.   5. The inferior vena cava is normal in size with greater than 50%  respiratory variability, suggesting right atrial pressure of 3 mmHg.   Comparison(s): No prior Echocardiogram.   Conclusion(s)/Recommendation(s): No intracardiac source of embolism  detected on this transthoracic study. A transesophageal echocardiogram is  recommended to exclude cardiac source of embolism if clinically indicated.  EKG:  EKG normal sinus rhythm with normal EKG appearance  Recent Labs: 05/29/2020: Magnesium 2.1 06/01/2020: ALT 21; BUN 18; Creatinine, Ser 0.87; Potassium 4.0; Sodium 139 06/02/2020: Hemoglobin 12.6; Platelets 332  Recent Lipid Panel    Component Value Date/Time   CHOL 234 (H) 05/28/2020 0334   TRIG 135 05/28/2020 0334   HDL 68 05/28/2020 0334   CHOLHDL 3.4 05/28/2020 0334   VLDL 27 05/28/2020 0334   LDLCALC 139 (H) 05/28/2020 0334    Physical Exam:    VS:  BP 128/72   Pulse 74   Ht '5\' 5"'$  (1.651 m)   Wt 154 lb 6.4 oz (70 kg)   SpO2 96%   BMI 25.69 kg/m     Wt Readings from Last 3 Encounters:  02/06/21 154 lb 6.4 oz (70 kg)  11/06/20 154 lb (69.9 kg)  09/11/20 151 lb (68.5 kg)     GEN: Compatible with age. No acute distress HEENT: Normal NECK: No JVD. LYMPHATICS: No lymphadenopathy CARDIAC: No  murmur. RRR no gallop, or edema. VASCULAR:  Normal Pulses. No bruits. RESPIRATORY:  Clear to auscultation without rales, wheezing or rhonchi  ABDOMEN: Soft, non-tender, non-distended, No pulsatile mass, MUSCULOSKELETAL: No deformity  SKIN: Warm and dry NEUROLOGIC:  Alert and oriented x 3.  Walks with a cane.  Some weakness on the right side. PSYCHIATRIC:  Normal affect   ASSESSMENT:    1. Chest pain of uncertain etiology   2. 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 Hyperlipidemia LDL goal <70   4. Preoperative cardiovascular examination   5. Other  acute back pain    PLAN:    In order of problems listed above:  This is actually subscapular pain on the left that is clearly noncardiac and requires no work-up.  It should be investigated with a chest x-ray and if no explanation for the pain, consideration of CT scan or MRI.  I suspect the discomfort is related to thoracic spine entrapment of a subcostal nerve.  Rule out other. Secondary prevention discussed Continue statin therapy. The patient is cleared from cardiac standpoint for any upcoming orthopedic procedure. PA and lateral chest x-ray   We will get a PA and lateral chest x-ray.  Further work-up of this recurring left subclavicular pain should be by primary care and or Dr. Hassell Done.   Overall education and awareness concerning primary/secondary risk prevention was discussed in detail: LDL less than 70, hemoglobin A1c less than 7, blood pressure target less than 130/80 mmHg, >150 minutes of moderate aerobic activity per week, avoidance of smoking, weight control (via diet and exercise), and continued surveillance/management of/for obstructive sleep apnea.    Medication Adjustments/Labs and Tests Ordered: Current medicines are reviewed at length with the patient today.  Concerns regarding medicines are outlined above.  Orders Placed This Encounter  Procedures   DG Chest 2 View   EKG  12-Lead    No orders of the defined types were placed in this encounter.   Patient Instructions  Medication Instructions:  Your physician recommends that you continue on your current medications as directed. Please refer to the Current Medication list given to you today.  *If you need a refill on your cardiac medications before your next appointment, please call your pharmacy*   Lab Work: None If you have labs (blood work) drawn today and your tests are completely normal, you will receive your results only by: [place] (if you have MyChart) OR A paper copy in the mail If you have any lab test that is abnormal or we need to change your treatment, we will call you to review the results.   Testing/Procedures: A chest x-ray takes a picture of the organs and structures inside the chest, including the heart, lungs, and blood vessels. This test can show several things, including, whether the heart is enlarges; whether fluid is building up in the lungs; and whether pacemaker / defibrillator leads are still in place.  Please report to: [address] [facility])   Follow-Up: At [facility], you and your health needs are our priority.  As part of our continuing mission to provide you with exceptional heart care, we have created designated Provider Care Teams.  These Care Teams include your primary Cardiologist (physician) and Advanced Practice Providers (APPs -  Physician Assistants and Nurse Practitioners) who all work together to provide you with the care you need, when you need it.  We recommend signing up for the patient portal called "MyChart".  Sign up information is provided on this After Visit Summary.  MyChart is used to connect with patients for Virtual Visits (Telemedicine).  Patients are able to view lab/test results, encounter notes, upcoming appointments, etc.  Non-urgent messages can be sent to your provider as well.   To learn  more about what you can do with MyChart, go to [email or web address].    Your next appointment:   As needed  The format for your next appointment:   In Person  Provider:   You may see Daneen Schick, MD or one of the following Advanced Practice  Providers on your designated Care Team:   Cecilie Kicks, NP   Other Instructions     Signed, Sinclair Grooms, MD  02/06/2021 10:23 AM    [place]

## 2021-02-06 ENCOUNTER — Ambulatory Visit
Admission: RE | Admit: 2021-02-06 | Discharge: 2021-02-06 | Disposition: A | Discharge status: Home / Self Care | Payer: Medicare HMO | Source: Ambulatory Visit | Attending: Interventional Cardiology | Admitting: Interventional Cardiology

## 2021-02-06 ENCOUNTER — Other Ambulatory Visit: Payer: Self-pay

## 2021-02-06 ENCOUNTER — Encounter: Payer: Self-pay | Admitting: Interventional Cardiology

## 2021-02-06 ENCOUNTER — Ambulatory Visit: Payer: Medicare HMO | Admitting: Interventional Cardiology

## 2021-02-06 VITALS — BP 128/72 | HR 74 | Ht 65.0 in | Wt 154.4 lb

## 2021-02-06 DIAGNOSIS — R079 Chest pain, unspecified: Secondary | ICD-10-CM | POA: Diagnosis not present

## 2021-02-06 DIAGNOSIS — M419 Scoliosis, unspecified: Secondary | ICD-10-CM | POA: Diagnosis not present

## 2021-02-06 DIAGNOSIS — Z01810 Encounter for preprocedural cardiovascular examination: Secondary | ICD-10-CM

## 2021-02-06 DIAGNOSIS — M549 Dorsalgia, unspecified: Secondary | ICD-10-CM | POA: Diagnosis not present

## 2021-02-06 DIAGNOSIS — E785 Hyperlipidemia, unspecified: Secondary | ICD-10-CM

## 2021-02-06 DIAGNOSIS — M5489 Other dorsalgia: Secondary | ICD-10-CM

## 2021-02-06 DIAGNOSIS — M25512 Pain in left shoulder: Secondary | ICD-10-CM | POA: Diagnosis not present

## 2021-02-06 DIAGNOSIS — I63031 Cerebral infarction due to thrombosis of right carotid artery: Secondary | ICD-10-CM

## 2021-02-06 NOTE — Patient Instructions (Signed)
Medication Instructions:  Your physician recommends that you continue on your current medications as directed. Please refer to the Current Medication list given to you today.  *If you need a refill on your cardiac medications before your next appointment, please call your pharmacy*   Lab Work: None If you have labs (blood work) drawn today and your tests are completely normal, you will receive your results only by: [place] (if you have MyChart) OR A paper copy in the mail If you have any lab test that is abnormal or we need to change your treatment, we will call you to review the results.   Testing/Procedures: A chest x-ray takes a picture of the organs and structures inside the chest, including the heart, lungs, and blood vessels. This test can show several things, including, whether the heart is enlarges; whether fluid is building up in the lungs; and whether pacemaker / defibrillator leads are still in place.  Please report to: [place] [address] [facility])   Follow-Up: At [facility], you and your health needs are our priority.  As part of our continuing mission to provide you with exceptional heart care, we have created designated Provider Care Teams.  These Care Teams include your primary Cardiologist (physician) and Advanced Practice Providers (APPs -  Physician Assistants and Nurse Practitioners) who all work together to provide you with the care you need, when you need it.  We recommend signing up for the patient portal called "MyChart".  Sign up information is provided on this After Visit Summary.  MyChart is used to connect with patients for Virtual Visits (Telemedicine).  Patients are able to view lab/test results, encounter notes, upcoming appointments, etc.  Non-urgent messages can be sent to your provider as well.   To learn more about what you can do with MyChart, go to [email or web address].    Your next appointment:   As  needed  The format for your next appointment:   In Person  Provider:   You may see Daneen Schick, MD or one of the following Advanced Practice Providers on your designated Care Team:   Cecilie Kicks, NP   Other Instructions

## 2021-02-11 DIAGNOSIS — C641 Malignant neoplasm of right kidney, except renal pelvis: Secondary | ICD-10-CM | POA: Diagnosis not present

## 2021-02-11 DIAGNOSIS — K219 Gastro-esophageal reflux disease without esophagitis: Secondary | ICD-10-CM | POA: Diagnosis not present

## 2021-02-11 DIAGNOSIS — E785 Hyperlipidemia, unspecified: Secondary | ICD-10-CM | POA: Diagnosis not present

## 2021-02-11 DIAGNOSIS — N189 Chronic kidney disease, unspecified: Secondary | ICD-10-CM | POA: Diagnosis not present

## 2021-02-11 DIAGNOSIS — N183 Chronic kidney disease, stage 3 unspecified: Secondary | ICD-10-CM | POA: Diagnosis not present

## 2021-02-13 ENCOUNTER — Ambulatory Visit: Payer: Medicare HMO | Admitting: Interventional Cardiology

## 2021-02-13 NOTE — Progress Notes (Signed)
[facility] Summary Report / Loop Recorder 

## 2021-02-18 DIAGNOSIS — M25511 Pain in right shoulder: Secondary | ICD-10-CM | POA: Diagnosis not present

## 2021-02-18 DIAGNOSIS — M7541 Impingement syndrome of right shoulder: Secondary | ICD-10-CM | POA: Diagnosis not present

## 2021-02-26 ENCOUNTER — Encounter: Payer: Self-pay | Admitting: Physical Medicine & Rehabilitation

## 2021-02-26 ENCOUNTER — Other Ambulatory Visit: Payer: Self-pay

## 2021-02-26 ENCOUNTER — Encounter: Payer: Medicare HMO | Attending: Physical Medicine & Rehabilitation | Admitting: Physical Medicine & Rehabilitation

## 2021-02-26 VITALS — BP 126/84 | HR 70 | Temp 98.3°F | Ht 65.0 in | Wt 150.4 lb

## 2021-02-26 DIAGNOSIS — R269 Unspecified abnormalities of gait and mobility: Secondary | ICD-10-CM | POA: Diagnosis not present

## 2021-02-26 DIAGNOSIS — Z8673 Personal history of transient ischemic attack (TIA), and cerebral infarction without residual deficits: Secondary | ICD-10-CM | POA: Insufficient documentation

## 2021-02-26 DIAGNOSIS — I69398 Other sequelae of cerebral infarction: Secondary | ICD-10-CM | POA: Diagnosis not present

## 2021-02-26 LAB — CUP PACEART REMOTE DEVICE CHECK
Date Time Interrogation Session: 20220910124959
Implantable Pulse Generator Implant Date: 20211217

## 2021-02-26 NOTE — Progress Notes (Signed)
Subjective:    Patient ID: [name], female    DOB: [birth year], 80 y.o.   MRN: [id number]  80 year old female with right ACA distribution CVA in December who completed inpatient rehabilitation now at a modified independent level.  Her main complaint is left knee pain.  She does have a left leg length discrepancy following a hip fracture and needs a built up shoe on the left side to make up for approximately 1.5 inches length. She has concerns of a bulge at the back of her knee on the left side.  This is not painful.  She has had no falls or trauma to the area.  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  We discussed normal timeframe of recovery and that she is approximately 9 months post stroke.  The patient feels like her weakness in the left lower extremity has largely improved over time although still using cane. Was not using cane prior to stroke despite hx of orthopedic injuries Still mod I  Patient with history of chronic right shoulder pain gets injections with Dr. Alma Friendly at [facility].  Having some left shoulder pain mainly in the scapular area.  No falls or trauma to that area.  And  Pain Inventory Average Pain 6 Pain Right Now 6 My pain is constant, tingling, and aching  In the last 24 hours, has pain interfered with the following? General activity 2 Relation with others 2 Enjoyment of life 2 What TIME of day is your pain at its worst? daytime Sleep (in general) Good  Pain is worse with: walking, bending, standing, and some activites Pain improves with: medication Relief from Meds: 4  Family History  Problem Relation Age of Onset   Colon cancer Mother    Coronary artery disease  Father    Breast cancer Neg Hx    Social History   Socioeconomic History   Marital status: Married    Spouse name: Not on file   Number of children: Not on file   Years of education: Not on file   Highest education level: Not on file  Occupational History   Not on file  Tobacco Use   Smoking status: Never   Smokeless tobacco: Never  Substance and Sexual Activity   Alcohol use: No   Drug use: No   Sexual activity: Not on file  Other Topics Concern   Not on file  Social History Narrative   Not on file   Social Determinants of Health   Financial Resource Strain: Not on file  Food Insecurity: Not on file  Transportation Needs: Not on file  Physical Activity: Not on file  Stress: Not on file  Social Connections: Not on file   Past Surgical History:  Procedure Laterality Date   ABDOMINAL HYSTERECTOMY     COLON SURGERY     Severe sigmoid diverticulitis   COLONOSCOPY     FOOT SURGERY     HIP SURGERY     Kidney cancer     Kidney removed   LOOP RECORDER INSERTION N/A 05/30/2020   Procedure: LOOP RECORDER INSERTION;  Surgeon: Vickie Epley, MD;  Location: [facility];  Service: Cardiovascular;  Laterality: N/A;   POLYPECTOMY     Past Surgical  History:  Procedure Laterality Date   ABDOMINAL HYSTERECTOMY     COLON SURGERY     Severe sigmoid diverticulitis   COLONOSCOPY     FOOT SURGERY     HIP SURGERY     Kidney cancer     Kidney removed   LOOP RECORDER INSERTION N/A 05/30/2020   Procedure: LOOP RECORDER INSERTION;  Surgeon: Vickie Epley, MD;  Location: [facility];  Service: Cardiovascular;  Laterality: N/A;   POLYPECTOMY     Past Medical History:  Diagnosis Date   Blood transfusion without reported diagnosis    with hip fracture-79 years old   Cancer [facility]) 1983   Kidney Cancer   Diverticulosis    GERD (gastroesophageal reflux disease)    Personal history of kidney cancer    There were no vitals taken for this visit.  Opioid Risk  Score:   Fall Risk Score:  `1  Depression screen PHQ 2/9  Depression screen PHQ 2/9 06/20/2020  Decreased Interest 0  Down, Depressed, Hopeless 0  PHQ - 2 Score 0    Review of Systems  Musculoskeletal:  Positive for gait problem.       Left leg pain behind knee  All other systems reviewed and are negative.     Objective:   Physical Exam Vitals and nursing note reviewed.  Constitutional:      Appearance: She is normal weight.  HENT:     Head: Normocephalic and atraumatic.  Eyes:     Extraocular Movements: Extraocular movements intact.     Conjunctiva/sclera: Conjunctivae normal.     Pupils: Pupils are equal, round, and reactive to light.  Neurological:     Mental Status: She is alert and oriented to person, place, and time. Mental status is at baseline.     Comments: Motor strength is 4+ in the left deltoid, bicep, tricep, grip There is reduced fine motor left upper extremity compared with right upper extremity Left lower extremity has 4/5 strength in hip flexor knee extensor ankle dorsiflexor. Right side is 5/5 in the upper and lower limb. Ambulates with cane no evidence of toe drag or knee instability She has a 1.5 inch buildup sole on the left side compared to the right side.  Hips appear level. Left hand has no sensation deficit to light touch or pin. Negative Phalen's No evidence of atrophy in the left APB  Psychiatric:        Mood and Affect: Mood normal.        Behavior: Behavior normal.   Left shoulder mildly positive impingement sign mildly tender along the medial scapular border. Left knee without evidence of effusion some fullness in the popliteal fossa no pain with range of motion no erythema  Patient is able to sense touch in left lower extremity although it feels different than the right side       Assessment & Plan:   1.  Right ACA distribution infarct with chronic left-sided weakness she also has sensory deficits in the left lower extremity, given  timeframe since stroke unlikely to have further spontaneous neurologic improvement.  She does not have any dysesthetic pain so neuropathic pain medications would likely be of little benefit. 2.  Left shoulder pain likely impingement syndrome given some lack of motor coordination which would alter shoulder biomechanics.  She sees her orthopedist for her right shoulder pain she can certainly follow-up with her orthopedist some left shoulder pain. 3.  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  The patient will continue with wrist splint.  She will follow-up with orthopedics.  She may benefit from carpal tunnel injection if she wishes to avoid surgery. Physical medicine rehab follow-up on as-needed basis

## 2021-02-26 NOTE — Patient Instructions (Signed)
Crestor may be an alternative to Lipitor, may discuss with Dr Rex Kras or Neurology Shoulder Impingement Syndrome Rehab Ask your health care provider which exercises are safe for you. Do exercises exactly as told by your health care provider and adjust them as directed. It is normal to feel mild stretching, pulling, tightness, or discomfort as you do these exercises. Stop right away if you feel sudden pain or your pain gets worse. Do not begin these exercises until told by your health care provider. Stretching and range-of-motion exercise This exercise warms up your muscles and joints and improves the movement and flexibility of your shoulder. This exercise also helps to relieve pain and stiffness. Passive horizontal adduction In passive adduction, you use your other hand to move the injured arm toward your body. The injured arm does not move on its own. In this movement, your arm is moved across your body in the horizontal plane (horizontal adduction). Sit or stand and pull your left / right elbow across your chest, toward your other shoulder. Stop when you feel a gentle stretch in the back of your shoulder and upper arm. Keep your arm at shoulder height. Keep your arm as close to your body as you comfortably can. Hold for __________ seconds. Slowly return to the starting position. Repeat __________ times. Complete this exercise __________ times a day. Strengthening exercises These exercises build strength and endurance in your shoulder. Endurance is the ability to use your muscles for a long time, even after they get tired. External rotation, isometric This is an exercise in which you press the back of your wrist against a door frame without moving your shoulder joint (isometric). Stand or sit in a doorway, facing the door frame. Bend your left / right elbow and place the back of your wrist against the door frame. Only the back of your wrist should be touching the frame. Keep your upper arm at your  side. Gently press your wrist against the door frame, as if you are trying to push your arm away from your abdomen (external rotation). Press as hard as you are able without pain. Avoid shrugging your shoulder while you press your wrist against the door frame. Keep your shoulder blade tucked down toward the middle of your back. Hold for __________ seconds. Slowly release the tension, and relax your muscles completely before you repeat the exercise. Repeat __________ times. Complete this exercise __________ times a day. Internal rotation, isometric This is an exercise in which you press your palm against a door frame without moving your shoulder joint (isometric). Stand or sit in a doorway, facing the door frame. Bend your left / right elbow and place the palm of your hand against the door frame. Only your palm should be touching the frame. Keep your upper arm at your side. Gently press your hand against the door frame, as if you are trying to push your arm toward your abdomen (internal rotation). Press as hard as you are able without pain. Avoid shrugging your shoulder while you press your hand against the door frame. Keep your shoulder blade tucked down toward the middle of your back. Hold for __________ seconds. Slowly release the tension, and relax your muscles completely before you repeat the exercise. Repeat __________ times. Complete this exercise __________ times a day. Scapular protraction, supine  Lie on your back on a firm surface (supine position). Hold a __________ weight in your left / right hand. Raise your left / right arm straight into the air so your  hand is directly above your shoulder joint. Push the weight into the air so your shoulder (scapula) lifts off the surface that you are lying on. The scapula will push up or forward (protraction). Do not move your head, neck, or back. Hold for __________ seconds. Slowly return to the starting position. Let your muscles relax  completely before you repeat this exercise. Repeat __________ times. Complete this exercise __________ times a day. Scapular retraction  Sit in a stable chair without armrests, or stand up. Secure an exercise band to a stable object in front of you so the band is at shoulder height. Hold one end of the exercise band in each hand. Your palms should face down. Squeeze your shoulder blades together (retraction) and move your elbows slightly behind you. Do not shrug your shoulders upward while you do this. Hold for __________ seconds. Slowly return to the starting position. Repeat __________ times. Complete this exercise __________ times a day. Shoulder extension  Sit in a stable chair without armrests, or stand up. Secure an exercise band to a stable object in front of you so the band is above shoulder height. Hold one end of the exercise band in each hand. Straighten your elbows and lift your hands up to shoulder height. Squeeze your shoulder blades together and pull your hands down to the sides of your thighs (extension). Stop when your hands are straight down by your sides. Do not let your hands go behind your body. Hold for __________ seconds. Slowly return to the starting position. Repeat __________ times. Complete this exercise __________ times a day. This information is not intended to replace advice given to you by your health care provider. Make sure you discuss any questions you have with your health care provider. Document Revised: 09/22/2018 Document Reviewed: 06/26/2018 Elsevier Patient Education  2022 [employer]   Ask Dr Veverly Fells to Check Left shoulder and shoulder blade  May benefit from carpal tunnel injection

## 2021-03-02 ENCOUNTER — Ambulatory Visit (INDEPENDENT_AMBULATORY_CARE_PROVIDER_SITE_OTHER): Payer: Medicare HMO

## 2021-03-02 DIAGNOSIS — I63031 Cerebral infarction due to thrombosis of right carotid artery: Secondary | ICD-10-CM | POA: Diagnosis not present

## 2021-03-05 NOTE — Progress Notes (Signed)
[facility] Summary Report / Loop Recorder 

## 2021-03-06 ENCOUNTER — Ambulatory Visit: Payer: Medicare HMO | Admitting: Physical Medicine & Rehabilitation

## 2021-03-10 ENCOUNTER — Ambulatory Visit: Payer: Medicare HMO | Admitting: Physical Medicine & Rehabilitation

## 2021-03-16 ENCOUNTER — Ambulatory Visit: Payer: Medicare HMO

## 2021-03-16 ENCOUNTER — Other Ambulatory Visit: Payer: Self-pay | Admitting: Physical Medicine & Rehabilitation

## 2021-03-16 ENCOUNTER — Other Ambulatory Visit: Payer: Medicare HMO

## 2021-04-01 DIAGNOSIS — E785 Hyperlipidemia, unspecified: Secondary | ICD-10-CM | POA: Diagnosis not present

## 2021-04-01 DIAGNOSIS — N189 Chronic kidney disease, unspecified: Secondary | ICD-10-CM | POA: Diagnosis not present

## 2021-04-01 DIAGNOSIS — C641 Malignant neoplasm of right kidney, except renal pelvis: Secondary | ICD-10-CM | POA: Diagnosis not present

## 2021-04-01 DIAGNOSIS — N183 Chronic kidney disease, stage 3 unspecified: Secondary | ICD-10-CM | POA: Diagnosis not present

## 2021-04-01 DIAGNOSIS — K219 Gastro-esophageal reflux disease without esophagitis: Secondary | ICD-10-CM | POA: Diagnosis not present

## 2021-04-02 ENCOUNTER — Telehealth: Payer: Self-pay | Admitting: Adult Health

## 2021-04-02 NOTE — Telephone Encounter (Signed)
Noted thank you

## 2021-04-02 NOTE — Telephone Encounter (Signed)
Pt is stating she has been told that [employer](who bought our [employer] according to Pt) gave her a referral # [id number] re: there being a need for 3 months of build up for her shoe. Pt has given the # of [phone] for [employer] if there are questions re: the order that is needed.

## 2021-04-06 ENCOUNTER — Ambulatory Visit (INDEPENDENT_AMBULATORY_CARE_PROVIDER_SITE_OTHER): Payer: Medicare HMO

## 2021-04-06 DIAGNOSIS — I63031 Cerebral infarction due to thrombosis of right carotid artery: Secondary | ICD-10-CM

## 2021-04-06 LAB — CUP PACEART REMOTE DEVICE CHECK
Date Time Interrogation Session: 20221023230330
Implantable Pulse Generator Implant Date: 20211217

## 2021-04-13 ENCOUNTER — Other Ambulatory Visit: Payer: Self-pay

## 2021-04-13 ENCOUNTER — Ambulatory Visit
Admission: RE | Admit: 2021-04-13 | Discharge: 2021-04-13 | Disposition: A | Discharge status: Home / Self Care | Payer: Medicare HMO | Source: Ambulatory Visit | Attending: Family Medicine | Admitting: Family Medicine

## 2021-04-13 DIAGNOSIS — Z1231 Encounter for screening mammogram for malignant neoplasm of breast: Secondary | ICD-10-CM | POA: Diagnosis not present

## 2021-04-13 NOTE — Progress Notes (Signed)
[facility] Summary Report / Loop Recorder 

## 2021-04-14 ENCOUNTER — Ambulatory Visit
Admission: RE | Admit: 2021-04-14 | Discharge: 2021-04-14 | Disposition: A | Discharge status: Home / Self Care | Payer: Medicare HMO | Source: Ambulatory Visit | Attending: Family Medicine | Admitting: Family Medicine

## 2021-04-14 DIAGNOSIS — E2839 Other primary ovarian failure: Secondary | ICD-10-CM

## 2021-04-14 DIAGNOSIS — Z780 Asymptomatic menopausal state: Secondary | ICD-10-CM | POA: Diagnosis not present

## 2021-04-30 ENCOUNTER — Other Ambulatory Visit: Payer: Self-pay

## 2021-04-30 ENCOUNTER — Ambulatory Visit (INDEPENDENT_AMBULATORY_CARE_PROVIDER_SITE_OTHER): Payer: Medicare HMO | Admitting: Ophthalmology

## 2021-04-30 ENCOUNTER — Encounter (INDEPENDENT_AMBULATORY_CARE_PROVIDER_SITE_OTHER): Payer: Self-pay | Admitting: Ophthalmology

## 2021-04-30 DIAGNOSIS — H43813 Vitreous degeneration, bilateral: Secondary | ICD-10-CM | POA: Diagnosis not present

## 2021-04-30 DIAGNOSIS — H35373 Puckering of macula, bilateral: Secondary | ICD-10-CM

## 2021-04-30 DIAGNOSIS — H35371 Puckering of macula, right eye: Secondary | ICD-10-CM

## 2021-04-30 DIAGNOSIS — H35372 Puckering of macula, left eye: Secondary | ICD-10-CM | POA: Diagnosis not present

## 2021-04-30 DIAGNOSIS — D3131 Benign neoplasm of right choroid: Secondary | ICD-10-CM

## 2021-04-30 DIAGNOSIS — H35412 Lattice degeneration of retina, left eye: Secondary | ICD-10-CM

## 2021-04-30 NOTE — Assessment & Plan Note (Signed)
Minor no impact on acuity will observe OCT next visit

## 2021-04-30 NOTE — Assessment & Plan Note (Signed)
Minor clinically seen with no topographic distortion

## 2021-04-30 NOTE — Assessment & Plan Note (Signed)
No interval change OD, will continue observe no high risk features

## 2021-04-30 NOTE — Progress Notes (Signed)
04/30/2021     CHIEF COMPLAINT Patient presents for  Chief Complaint  Patient presents with   Retina Follow Up      HISTORY OF PRESENT ILLNESS: [name] is a 80 y.o. 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  This started 1 year ago.  Severity is moderate.  Duration of 1 year.  Since onset it is gradually worsening.  I, the attending physician,  performed the HPI with the patient and updated documentation appropriately.        Comments   1 yr fu OU fp. Patient states "I had a stroke in December, it has been almost a year but it feels like my vision is down in my left eye some. Maybe in both but I notice my left eye changed more. 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       Last edited by Laurin Coder on 04/30/2021  9:33 AM.      Referring physician: Hulan Fess, MD [place],  [place] [id number]  HISTORICAL INFORMATION:   Selected notes from the MEDICAL RECORD NUMBER    Lab Results  Component Value Date   HGBA1C 5.7 (H) 05/28/2020     CURRENT MEDICATIONS: No current outpatient medications on file. (Ophthalmic Drugs)   No current facility-administered medications for this visit. (Ophthalmic Drugs)   Current Outpatient Medications (Other)  Medication Sig   acetaminophen (TYLENOL) 650 MG CR tablet Take 650 mg by mouth daily.   aspirin EC 81 MG EC tablet Take 1 tablet (81 mg total) by mouth daily. Swallow whole.   atorvastatin (LIPITOR) 40 MG tablet Take 40 mg by mouth daily.   cyclobenzaprine (FLEXERIL) 5 MG tablet TAKE ONE TABLET BY MOUTH THREE TIMES A DAY AS NEEDED FOR MUSCLE SPASMS   fluticasone (FLONASE) 50 MCG/ACT nasal spray    ipratropium (ATROVENT) 0.06 % nasal spray    lidocaine (LIDODERM) 5 % Place 2 patches onto the skin daily. On for 12 hours and has to be off for 12 hrs. Purchase  this over the counter.   MAGNESIUM PO Take 400 mg by mouth every evening.   Multiple Vitamins-Minerals (MULTIVITAMIN WITH MINERALS) tablet Take 1 tablet by mouth daily.   pantoprazole (PROTONIX) 40 MG tablet Take 1 tablet (40 mg total) by mouth daily.   PARoxetine (PAXIL) 10 MG tablet Take 10 mg by mouth daily.   polyethylene glycol (MIRALAX / GLYCOLAX) 17 g packet Take 17 g by mouth daily.   senna-docusate (SENOKOT-S) 8.6-50 MG tablet Take 1 tablet by mouth 2 (two) times daily.   vitamin C (ASCORBIC ACID) 500 MG tablet Take 500 mg by mouth daily.   No current facility-administered medications for this visit. (Other)      REVIEW OF SYSTEMS:    ALLERGIES Allergies  Allergen Reactions   Codeine Other (See Comments)    Pt unsure if really allergic to this    PAST MEDICAL HISTORY Past Medical History:  Diagnosis Date   Blood transfusion without reported diagnosis    with hip fracture-79 years old   Cancer [facility]) 1983   Kidney Cancer   Diverticulosis    GERD (gastroesophageal reflux disease)    Personal history of kidney cancer    Past Surgical History:  Procedure Laterality  Date   ABDOMINAL HYSTERECTOMY     COLON SURGERY     Severe sigmoid diverticulitis   COLONOSCOPY     FOOT SURGERY     HIP SURGERY     Kidney cancer     Kidney removed   LOOP RECORDER INSERTION N/A 05/30/2020   Procedure: LOOP RECORDER INSERTION;  Surgeon: Vickie Epley, MD;  Location: [facility];  Service: Cardiovascular;  Laterality: N/A;   POLYPECTOMY      FAMILY HISTORY Family History  Problem Relation Age of Onset   Colon cancer Mother    Coronary artery disease Father    Breast cancer Neg Hx     SOCIAL HISTORY Social History   Tobacco Use   Smoking status: Never   Smokeless tobacco: Never  Vaping Use   Vaping Use: Never used  Substance Use Topics   Alcohol use: No   Drug use: No         OPHTHALMIC EXAM:  Base Eye Exam     Visual Acuity (ETDRS)       Right  Left   Dist cc 20/30 -2 20/30 -2   Dist ph cc  20/25 -1    Correction: Glasses         Tonometry (Tonopen, 9:37 AM)       Right Left   Pressure 10 10         Pupils       Pupils Dark Light APD   Right PERRL 4 3 None   Left PERRL 4 3 None         Extraocular Movement       Right Left    Full Full         Neuro/Psych     Oriented x3: Yes   Mood/Affect: Normal         Dilation     Both eyes: 1.0% Mydriacyl, 2.5% Phenylephrine @ 9:37 AM           Slit Lamp and Fundus Exam     External Exam       Right Left   External Normal Normal         Slit Lamp Exam       Right Left   Lids/Lashes Normal Normal   Conjunctiva/Sclera White and quiet White and quiet   Cornea Clear Clear   Anterior Chamber Deep and quiet Deep and quiet   Iris Round and reactive Round and reactive   Lens Posterior chamber intraocular lens Posterior chamber intraocular lens   Anterior Vitreous Normal Normal         Fundus Exam       Right Left   Posterior Vitreous Posterior vitreous detachment, Central vitreous floaters Posterior vitreous detachment   Disc Normal Normal   C/D Ratio 0.2 0.3   Macula Epiretinal membrane, mild Epiretinal membrane, mild   Vessels Normal Normal   Periphery 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   Left Eye Progression has been stable. Macula : normal observations. Vessels : normal observations.   Notes OS good retinopexy superotemporally, old retinal tear, no new retinal breaks.  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 Benign neoplasm of choroid, right  D31.31 Color Fundus Photography Optos - OU - Both Eyes    2. Posterior vitreous detachment of both eyes  H43.813 Color Fundus Photography Optos - OU - Both Eyes    3. Left epiretinal membrane  H35.372     4. Epiretinal membrane, right eye  H35.371     5. Left retinal lattice degeneration  H35.412       1.  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  OS with history of retinal tears no new breaks OU  3.  OU with mild clinical epiretinal membranes no impact on acuity observe  4.  History of stroke in the last 12 months, no impact on acuity.  Ophthalmic Meds Ordered this visit:  No orders of the defined types were placed in this encounter.      Return in about 1 year (around 04/30/2022) for DILATE OU, COLOR FP, OCT.  There are no Patient Instructions on file for this visit.   Explained the diagnoses, plan, and follow up with the patient and they expressed understanding.  Patient expressed understanding of the importance of proper follow up care.   Clent Demark Shota Kohrs M.D. Diseases & Surgery of the Retina and Vitreous [facility]ina [facility] Di[facility] [place] 04/30/21     Abbreviations: M myopia (nearsighted); A astigmatism; H hyperopia (farsighted); P presbyopia; Mrx spectacle prescription;  CTL contact lenses; OD right eye; OS left eye; OU both eyes  XT exotropia; ET esotropia; PEK punctate epithelial keratitis; PEE punctate epithelial erosions; DES dry eye  syndrome; MGD meibomian gland dysfunction; ATs artificial tears; PFAT's preservative free artificial tears; Wheeler nuclear sclerotic cataract; PSC posterior subcapsular cataract; ERM epi-retinal membrane; PVD posterior vitreous detachment; RD retinal detachment; DM diabetes mellitus; DR diabetic retinopathy; NPDR non-proliferative diabetic retinopathy; PDR proliferative diabetic retinopathy; CSME clinically significant macular edema; DME diabetic macular edema; dbh dot blot hemorrhages; CWS cotton wool spot; POAG primary open angle glaucoma; C/D cup-to-disc ratio; HVF humphrey visual field; GVF goldmann visual field; OCT optical coherence tomography; IOP intraocular pressure; BRVO Branch retinal vein occlusion; CRVO central retinal vein occlusion; CRAO central retinal artery occlusion; BRAO branch retinal artery occlusion; RT retinal tear; SB scleral buckle; PPV pars plana vitrectomy; VH Vitreous hemorrhage; PRP panretinal laser photocoagulation; IVK intravitreal kenalog; VMT vitreomacular traction; MH Macular hole;  NVD neovascularization of the disc; NVE neovascularization elsewhere; AREDS age related eye disease study; ARMD age related macular degeneration; POAG primary open angle glaucoma; EBMD epithelial/anterior basement membrane dystrophy; ACIOL anterior chamber intraocular lens; IOL intraocular lens; PCIOL posterior chamber intraocular lens; Phaco/IOL phacoemulsification with intraocular lens placement; Bicknell photorefractive keratectomy; LASIK laser assisted in situ keratomileusis; HTN hypertension; DM diabetes mellitus; COPD chronic obstructive pulmonary disease

## 2021-04-30 NOTE — Assessment & Plan Note (Signed)
Physiologic and stable OU

## 2021-04-30 NOTE — Assessment & Plan Note (Signed)
History of retinal tear OS no new breaks

## 2021-05-03 ENCOUNTER — Other Ambulatory Visit: Payer: Self-pay | Admitting: Physical Medicine & Rehabilitation

## 2021-05-09 LAB — CUP PACEART REMOTE DEVICE CHECK
Date Time Interrogation Session: 20221125230325
Implantable Pulse Generator Implant Date: 20211217

## 2021-05-11 ENCOUNTER — Ambulatory Visit: Payer: Medicare HMO

## 2021-05-12 DIAGNOSIS — E785 Hyperlipidemia, unspecified: Secondary | ICD-10-CM | POA: Diagnosis not present

## 2021-05-12 DIAGNOSIS — N183 Chronic kidney disease, stage 3 unspecified: Secondary | ICD-10-CM | POA: Diagnosis not present

## 2021-05-12 DIAGNOSIS — K219 Gastro-esophageal reflux disease without esophagitis: Secondary | ICD-10-CM | POA: Diagnosis not present

## 2021-05-13 ENCOUNTER — Encounter: Payer: Self-pay | Admitting: Adult Health

## 2021-05-13 ENCOUNTER — Ambulatory Visit: Payer: Medicare HMO | Admitting: Adult Health

## 2021-05-13 VITALS — BP 126/64 | HR 82 | Ht 65.0 in | Wt 156.0 lb

## 2021-05-13 DIAGNOSIS — I639 Cerebral infarction, unspecified: Secondary | ICD-10-CM | POA: Diagnosis not present

## 2021-05-13 DIAGNOSIS — E785 Hyperlipidemia, unspecified: Secondary | ICD-10-CM | POA: Diagnosis not present

## 2021-05-13 NOTE — Progress Notes (Signed)
[facility] [place]. [place] [id number] [phone]       STROKE FOLLOW UP NOTE  [name] Date of Birth:  11/30/1940 Medical Record Number:  [id number]   Reason for Referral: stroke follow up    SUBJECTIVE:   CHIEF COMPLAINT:  Chief Complaint  Patient presents with   Follow-up    Rm 3 alone here for 6 month f/u. Pt reports she has been doing well since her last visit.      HPI:   Update 05/13/2021 JM: returns for 40-month stroke follow-up alone  Residual mild gait impairment, imbalance, and LLE numbness with ankle weakness stable.  Does report occasional left shoulder pain - is followed by ortho. No new stroke/TIA symptoms. Ambulates with a cane outside of the house - denies any recent falls. Lives with husband and is independent with ADLs and IADLs. Remains on aspirin and atorvastatin without side effects. BP today 126/64. Loop recorder shows no evidence of A fib thus far. No new neurological concerns at this time.    History provided for reference purposes only Initial visit 11/06/2020 JM: [name] returns for 23-month stroke follow-up unaccompanied  Reports residual mild gait impairment with imbalance but overall improvement since prior visit. Completed therapy and currently ambulating with cane without any recent falls.  She does continue to experience LLE numbness distally and ankle weakness but this has been gradually improving as well. Denies new stroke/TIA symptoms  Compliant on aspirin and atorvastatin without associated side effects Blood pressure today 124/73 Loop recorder has not shown atrial fibrillation thus far  She has multiple other concerns including left hand numbness typically present upon awakening with some improvement since using brace (present since wrist fracture), right shoulder pain being followed by orthopedics, left leg biopsy that was done in March that still has not completely healed and continued hair  loss.  No further concerns at this time  Initial visit 07/08/2020 JM: [name] is being seen for hospital follow-up unaccompanied.  She was discharged home from CIR on 06/06/2020 after a 6-day stay. She reports residual mild left ankle weakness, LLE pain and gait impairment.  Currently working with neuro rehab PT/OT with ongoing improvement.  She does admit to chronic gait impairment due to history of hip fracture at young age resulting in shorter left leg requiring platform shoe. She does believe her current gait ability is close to baseline with use of a cane for ambulation.  Denies new or worsening stroke/TIA symptoms. She has remained on DAPT with mild bruising but no bleeding. Remains on atorvastatin 40mg  daily without myalgias. Blood pressure today 129/76. Loop recorder has not shown atrial fibrillation thus far. Reports being on estrogen for postmenopausal hot flashes but discontinued during recent admission.  She has since been having issues with hot flashes and questions other treatment options.  No further concerns at this time.  Stroke admission 05/26/2020 [name] is a 80 y.o. female with history of kidney cancer (nephrectomy), previous stroke by imaging, diverticulosis, idiopathic peripheral neuropathy, and hip fracture at the age of 59 from [place] (left leg now shorter than right) presented to [facility] ED on 05/26/2020 with several day hx of LLE numbness / weakness resulting in a fall with left wrist fracture. Personally reviewed hospitalization pertinent progress notes, lab work and imaging with summary provided.  Evaluated by Dr. Erlinda Hong with stroke work-up revealing multifocal acute ischemia within the right ACA territory with right A2 ACA occlusion, embolic secondary to  unknown source.  2D echo normal EF without cardiac source of embolus identified and LE Doppler negative for DVT.  Recommended loop recorder placement to rule out A. fib as potential etiology.  Recommended DAPT for 3 months then  aspirin alone given right ICA large vessel occlusion.  LDL 143 and initiate atorvastatin 40 mg daily.  No prior history of HTN or DM with A1c 5.8.  Other stroke risk factors include advanced age and history of stroke on imaging.  Other active problems include left wrist fracture s/p fall.  Evaluated by therapies and discharged to CIR on 05/31/2020 as recommended for ongoing therapy needs.   Stroke: Multifocal acute ischemia within the right ACA territory - embolic - source unclear. CT head - No CT evidence for acute intracranial abnormality. Chronic right cerebellar infarct. Mild chronic small vessel ischemic change of the white matter. 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  MRI head -  Multifocal acute ischemia within the right hemisphere, predominantly within the anterior frontal lobe and medial parietal lobe. 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  MRA head - Right A2 ACA occlusion. Areas of infarction on prior study could reflect watershed ischemia. Moderate right supraclinoid ICA stenosis.  MRA Neck - Normal MRA of the neck.  2D Echo - EF 60 - 65%. No cardiac source of emboli identified.  LE venous doppler neg for DVT Loop recorder placed 05/30/2020 [name] Virus 2 - negative LDL - 143  HGBA1C - 5.8 VTE prophylaxis - SCDs No antithrombotic prior to admission, now on aspirin 81 mg daily and clopidogrel 75 mg daily DAPT for 3 months and then ASA alone given right ACA large vessel occlusion. Patient counseled to be compliant with her antithrombotic medications Ongoing aggressive stroke risk factor management Therapy recommendations:  CIR Disposition:  CIR     ROS:   14 system review of systems performed and negative with exception of those listed in HPI  PMH:  Past Medical History:  Diagnosis Date   Blood transfusion without reported diagnosis    with hip  fracture-80 years old   Cancer [facility]) 1983   Kidney Cancer   Diverticulosis    GERD (gastroesophageal reflux disease)    Personal history of kidney cancer     PSH:  Past Surgical History:  Procedure Laterality Date   ABDOMINAL HYSTERECTOMY     COLON SURGERY     Severe sigmoid diverticulitis   COLONOSCOPY     FOOT SURGERY     HIP SURGERY     Kidney cancer     Kidney removed   LOOP RECORDER INSERTION N/A 05/30/2020   Procedure: LOOP RECORDER INSERTION;  Surgeon: Vickie Epley, MD;  Location: [facility];  Service: Cardiovascular;  Laterality: N/A;   POLYPECTOMY      Social History:  Social History   Socioeconomic History   Marital status: Married    Spouse name: Not on file   Number of children: Not on file   Years of education: Not on file   Highest education level: Not on file  Occupational History   Not on file  Tobacco Use   Smoking status: Never   Smokeless tobacco: Never  Vaping Use   Vaping Use: Never used  Substance and Sexual Activity   Alcohol use: No   Drug use: No   Sexual activity: Not on file  Other Topics Concern  Not on file  Social History Narrative   Not on file   Social Determinants of Health   Financial Resource Strain: Not on file  Food Insecurity: Not on file  Transportation Needs: Not on file  Physical Activity: Not on file  Stress: Not on file  Social Connections: Not on file  Intimate Partner Violence: Not on file    Family History:  Family History  Problem Relation Age of Onset   Colon cancer Mother    Coronary artery disease Father    Breast cancer Neg Hx     Medications:   Current Outpatient Medications on File Prior to Visit  Medication Sig Dispense Refill   acetaminophen (TYLENOL) 650 MG CR tablet Take 650 mg by mouth daily.     aspirin EC 81 MG EC tablet Take 1 tablet (81 mg total) by mouth daily. Swallow whole. 30 tablet 0   atorvastatin (LIPITOR) 40 MG tablet Take 40 mg by mouth daily.      cyclobenzaprine (FLEXERIL) 5 MG tablet TAK 1 TABLET THREE TIMES A DAY AS NEEDED FOR MUSCLE SPASMS 30 tablet 0   fluticasone (FLONASE) 50 MCG/ACT nasal spray      ipratropium (ATROVENT) 0.06 % nasal spray      lidocaine (LIDODERM) 5 % Place 2 patches onto the skin daily. On for 12 hours and has to be off for 12 hrs. Purchase this over the counter. 30 patch 0   MAGNESIUM PO Take 400 mg by mouth every evening.     Multiple Vitamins-Minerals (MULTIVITAMIN WITH MINERALS) tablet Take 1 tablet by mouth daily.     pantoprazole (PROTONIX) 40 MG tablet Take 1 tablet (40 mg total) by mouth daily. 30 tablet 0   PARoxetine (PAXIL) 10 MG tablet Take 10 mg by mouth daily.     polyethylene glycol (MIRALAX / GLYCOLAX) 17 g packet Take 17 g by mouth daily. 30 each 0   senna-docusate (SENOKOT-S) 8.6-50 MG tablet Take 1 tablet by mouth 2 (two) times daily. 30 tablet 0   vitamin C (ASCORBIC ACID) 500 MG tablet Take 500 mg by mouth daily.     No current facility-administered medications on file prior to visit.    Allergies:   Allergies  Allergen Reactions   Codeine Other (See Comments)    Pt unsure if really allergic to this      OBJECTIVE:  Physical Exam  Vitals:   05/13/21 1345  BP: 126/64  Pulse: 82  SpO2: 96%  Weight: 156 lb (70.8 kg)  Height: 5\' 5"  (1.651 m)    Body mass index is 25.96 kg/m. No results found.  General: well developed, well nourished, very pleasant elderly Caucasian female, seated, in no evident distress Head: head normocephalic and atraumatic.   Neck: supple with no carotid or supraclavicular bruits Cardiovascular: regular rate and rhythm, no murmurs Musculoskeletal: Shorter left leg with use of platform shoe Skin:  no rash/petichiae Vascular:  Normal pulses all extremities   Neurologic Exam Mental Status: Awake and fully alert. Fluent speech and language. Oriented to place and time. Recent and remote memory intact. Attention span, concentration and fund of knowledge  appropriate. Mood and affect appropriate.  Cranial Nerves: Pupils equal, briskly reactive to light. Extraocular movements full without nystagmus. Visual fields full to confrontation. Hearing intact. Facial sensation intact. Face, tongue, palate moves normally and symmetrically.  Motor: Normal bulk and tone. Normal strength in all tested extremity muscles except LLE 4/5 hip flexor and ankle dorsiflexion Sensory: LLE diminished sensation, intact  to touch , pinprick , position and vibratory sensation.  Coordination: Rapid alternating movements normal in all extremities. Finger-to-nose and heel-to-shin performed accurately bilaterally. Gait and Station: Arises from chair without difficulty. Stance is normal.  Gait abnormality with decreased step length and wide-based gait with use of cane and left platform shoe Reflexes: 1+ and symmetric. Toes downgoing.       ASSESSMENT: [name] is a 80 y.o. year old female with multifocal acute ischemia within the right ACA territory with right A2 ACA occlusion, embolic secondary to unknown source on 05/26/2020 after presenting with several day history of LLE numbness/weakness resulting in a fall with left wrist fracture. Vascular risk factors include HLD, chronic right cerebellar infarct and advanced age.      PLAN:  R ACA stroke, cryptogenic:  Residual deficit: Gait impairment and LLE weakness and mild left shoulder pain.  Discussed use of cane at times unless otherwise instructed and to f/u with ortho re: shoulder pain for possible intervention if indicated Continue aspirin 81 mg daily and atorvastatin 40 mg daily for secondary stroke prevention Loop recorder has not shown atrial fibrillation thus far -monthly download reports personally reviewed Discussed secondary stroke prevention measures and importance of close PCP follow up for aggressive stroke risk factor management  HLD: LDL goal <70.  Continue atorvastatin 40 mg daily. Recent LDL 68  (12/24/20)   Overall stable from stroke standpoint - request consolidating care with PCP and f/u here as needed   CC:  Kristen Loader, FNP    I spent 31 minutes of face-to-face and non-face-to-face time with patient.  This included previsit chart review, lab review, study review, electronic health record documentation, patient education and discussion regarding history of prior stroke and residual deficits, aggressive stroke risk factor management and secondary stroke prevention measures and answered all other questions to patient satisfaction  Frann Rider, [facility]  [facility] [address] [place], [place] [address]  Phone [phone] Fax [phone] Note: This document was prepared with digital dictation and possible smart phrase technology. Any transcriptional errors that result from this process are unintentional.

## 2021-05-13 NOTE — Patient Instructions (Addendum)
Continue aspirin 81 mg daily  and atorvastatin  for secondary stroke prevention  Loop recorder has not shown atrial fibrillation thus far   Continue to follow up with PCP regarding cholesterol and blood pressure management  Maintain strict control of hypertension with blood pressure goal below 130/90 and cholesterol with LDL cholesterol (bad cholesterol) goal below 70 mg/dL.        Thank you for coming to see [place] at [facility]. I hope we have been able to provide you high quality care today.  You may receive a patient satisfaction survey over the next few weeks. We would appreciate your feedback and comments so that we may continue to improve ourselves and the health of our patients.

## 2021-06-11 ENCOUNTER — Ambulatory Visit (INDEPENDENT_AMBULATORY_CARE_PROVIDER_SITE_OTHER): Payer: Medicare HMO

## 2021-06-11 DIAGNOSIS — I63031 Cerebral infarction due to thrombosis of right carotid artery: Secondary | ICD-10-CM | POA: Diagnosis not present

## 2021-06-11 LAB — CUP PACEART REMOTE DEVICE CHECK
Date Time Interrogation Session: 20221228230159
Implantable Pulse Generator Implant Date: 20211217

## 2021-06-12 DIAGNOSIS — C641 Malignant neoplasm of right kidney, except renal pelvis: Secondary | ICD-10-CM | POA: Diagnosis not present

## 2021-06-12 DIAGNOSIS — N183 Chronic kidney disease, stage 3 unspecified: Secondary | ICD-10-CM | POA: Diagnosis not present

## 2021-06-12 DIAGNOSIS — E785 Hyperlipidemia, unspecified: Secondary | ICD-10-CM | POA: Diagnosis not present

## 2021-06-12 DIAGNOSIS — K219 Gastro-esophageal reflux disease without esophagitis: Secondary | ICD-10-CM | POA: Diagnosis not present

## 2021-06-23 NOTE — Progress Notes (Signed)
[facility] Summary Report / Loop Recorder 

## 2021-07-01 DIAGNOSIS — M7541 Impingement syndrome of right shoulder: Secondary | ICD-10-CM | POA: Diagnosis not present

## 2021-07-01 DIAGNOSIS — M25512 Pain in left shoulder: Secondary | ICD-10-CM | POA: Diagnosis not present

## 2021-07-01 DIAGNOSIS — M25511 Pain in right shoulder: Secondary | ICD-10-CM | POA: Diagnosis not present

## 2021-07-07 DIAGNOSIS — C641 Malignant neoplasm of right kidney, except renal pelvis: Secondary | ICD-10-CM | POA: Diagnosis not present

## 2021-07-07 DIAGNOSIS — N183 Chronic kidney disease, stage 3 unspecified: Secondary | ICD-10-CM | POA: Diagnosis not present

## 2021-07-07 DIAGNOSIS — E785 Hyperlipidemia, unspecified: Secondary | ICD-10-CM | POA: Diagnosis not present

## 2021-07-07 DIAGNOSIS — K219 Gastro-esophageal reflux disease without esophagitis: Secondary | ICD-10-CM | POA: Diagnosis not present

## 2021-07-20 ENCOUNTER — Other Ambulatory Visit: Payer: Self-pay | Admitting: Physical Medicine & Rehabilitation

## 2021-08-24 ENCOUNTER — Other Ambulatory Visit: Payer: Medicare HMO

## 2021-08-24 ENCOUNTER — Ambulatory Visit (INDEPENDENT_AMBULATORY_CARE_PROVIDER_SITE_OTHER): Payer: Medicare HMO

## 2021-08-24 DIAGNOSIS — I63031 Cerebral infarction due to thrombosis of right carotid artery: Secondary | ICD-10-CM | POA: Diagnosis not present

## 2021-08-24 LAB — CUP PACEART REMOTE DEVICE CHECK
Date Time Interrogation Session: 20230312231210
Implantable Pulse Generator Implant Date: 20211217

## 2021-09-03 NOTE — Progress Notes (Signed)
[facility] Summary Report / Loop Recorder 

## 2021-09-28 ENCOUNTER — Ambulatory Visit (INDEPENDENT_AMBULATORY_CARE_PROVIDER_SITE_OTHER): Payer: Medicare HMO

## 2021-09-28 DIAGNOSIS — I63031 Cerebral infarction due to thrombosis of right carotid artery: Secondary | ICD-10-CM | POA: Diagnosis not present

## 2021-09-28 LAB — CUP PACEART REMOTE DEVICE CHECK
Date Time Interrogation Session: 20230414230631
Implantable Pulse Generator Implant Date: 20211217

## 2021-10-02 DIAGNOSIS — E785 Hyperlipidemia, unspecified: Secondary | ICD-10-CM | POA: Diagnosis not present

## 2021-10-02 DIAGNOSIS — N183 Chronic kidney disease, stage 3 unspecified: Secondary | ICD-10-CM | POA: Diagnosis not present

## 2021-10-02 DIAGNOSIS — K219 Gastro-esophageal reflux disease without esophagitis: Secondary | ICD-10-CM | POA: Diagnosis not present

## 2021-10-14 NOTE — Progress Notes (Signed)
[facility] Summary Report / Loop Recorder 

## 2021-11-02 ENCOUNTER — Ambulatory Visit: Payer: Medicare HMO

## 2021-12-02 DIAGNOSIS — M7541 Impingement syndrome of right shoulder: Secondary | ICD-10-CM | POA: Diagnosis not present

## 2021-12-02 DIAGNOSIS — M25511 Pain in right shoulder: Secondary | ICD-10-CM | POA: Diagnosis not present

## 2021-12-02 DIAGNOSIS — M25512 Pain in left shoulder: Secondary | ICD-10-CM | POA: Diagnosis not present

## 2021-12-07 ENCOUNTER — Ambulatory Visit (INDEPENDENT_AMBULATORY_CARE_PROVIDER_SITE_OTHER): Payer: Medicare HMO

## 2021-12-07 DIAGNOSIS — I63031 Cerebral infarction due to thrombosis of right carotid artery: Secondary | ICD-10-CM

## 2021-12-25 DIAGNOSIS — Z0000 Encounter for general adult medical examination without abnormal findings: Secondary | ICD-10-CM | POA: Diagnosis not present

## 2021-12-25 DIAGNOSIS — Z1389 Encounter for screening for other disorder: Secondary | ICD-10-CM | POA: Diagnosis not present

## 2022-01-04 DIAGNOSIS — M25511 Pain in right shoulder: Secondary | ICD-10-CM | POA: Diagnosis not present

## 2022-01-04 NOTE — Progress Notes (Signed)
[facility] Summary Report / Loop Recorder 

## 2022-01-07 LAB — CUP PACEART REMOTE DEVICE CHECK
Date Time Interrogation Session: 20230722231029
Implantable Pulse Generator Implant Date: 20211217

## 2022-01-11 ENCOUNTER — Ambulatory Visit (INDEPENDENT_AMBULATORY_CARE_PROVIDER_SITE_OTHER): Payer: Medicare HMO

## 2022-01-11 DIAGNOSIS — I63031 Cerebral infarction due to thrombosis of right carotid artery: Secondary | ICD-10-CM | POA: Diagnosis not present

## 2022-01-12 DIAGNOSIS — M25511 Pain in right shoulder: Secondary | ICD-10-CM | POA: Diagnosis not present

## 2022-01-19 DIAGNOSIS — M25511 Pain in right shoulder: Secondary | ICD-10-CM | POA: Diagnosis not present

## 2022-01-20 DIAGNOSIS — M25511 Pain in right shoulder: Secondary | ICD-10-CM | POA: Diagnosis not present

## 2022-01-21 DIAGNOSIS — M25511 Pain in right shoulder: Secondary | ICD-10-CM | POA: Diagnosis not present

## 2022-01-26 DIAGNOSIS — M25511 Pain in right shoulder: Secondary | ICD-10-CM | POA: Diagnosis not present

## 2022-02-02 DIAGNOSIS — M25511 Pain in right shoulder: Secondary | ICD-10-CM | POA: Diagnosis not present

## 2022-02-05 DIAGNOSIS — M25511 Pain in right shoulder: Secondary | ICD-10-CM | POA: Diagnosis not present

## 2022-02-07 IMAGING — CR DG CHEST 2V
2 series · 2 of 2 positions shown · non-contrast
Comparison: 06/16/2011

CLINICAL DATA: 80-year-old female with left subscapular back pain

EXAM:
CHEST - 2 VIEW

[w chest pa *]
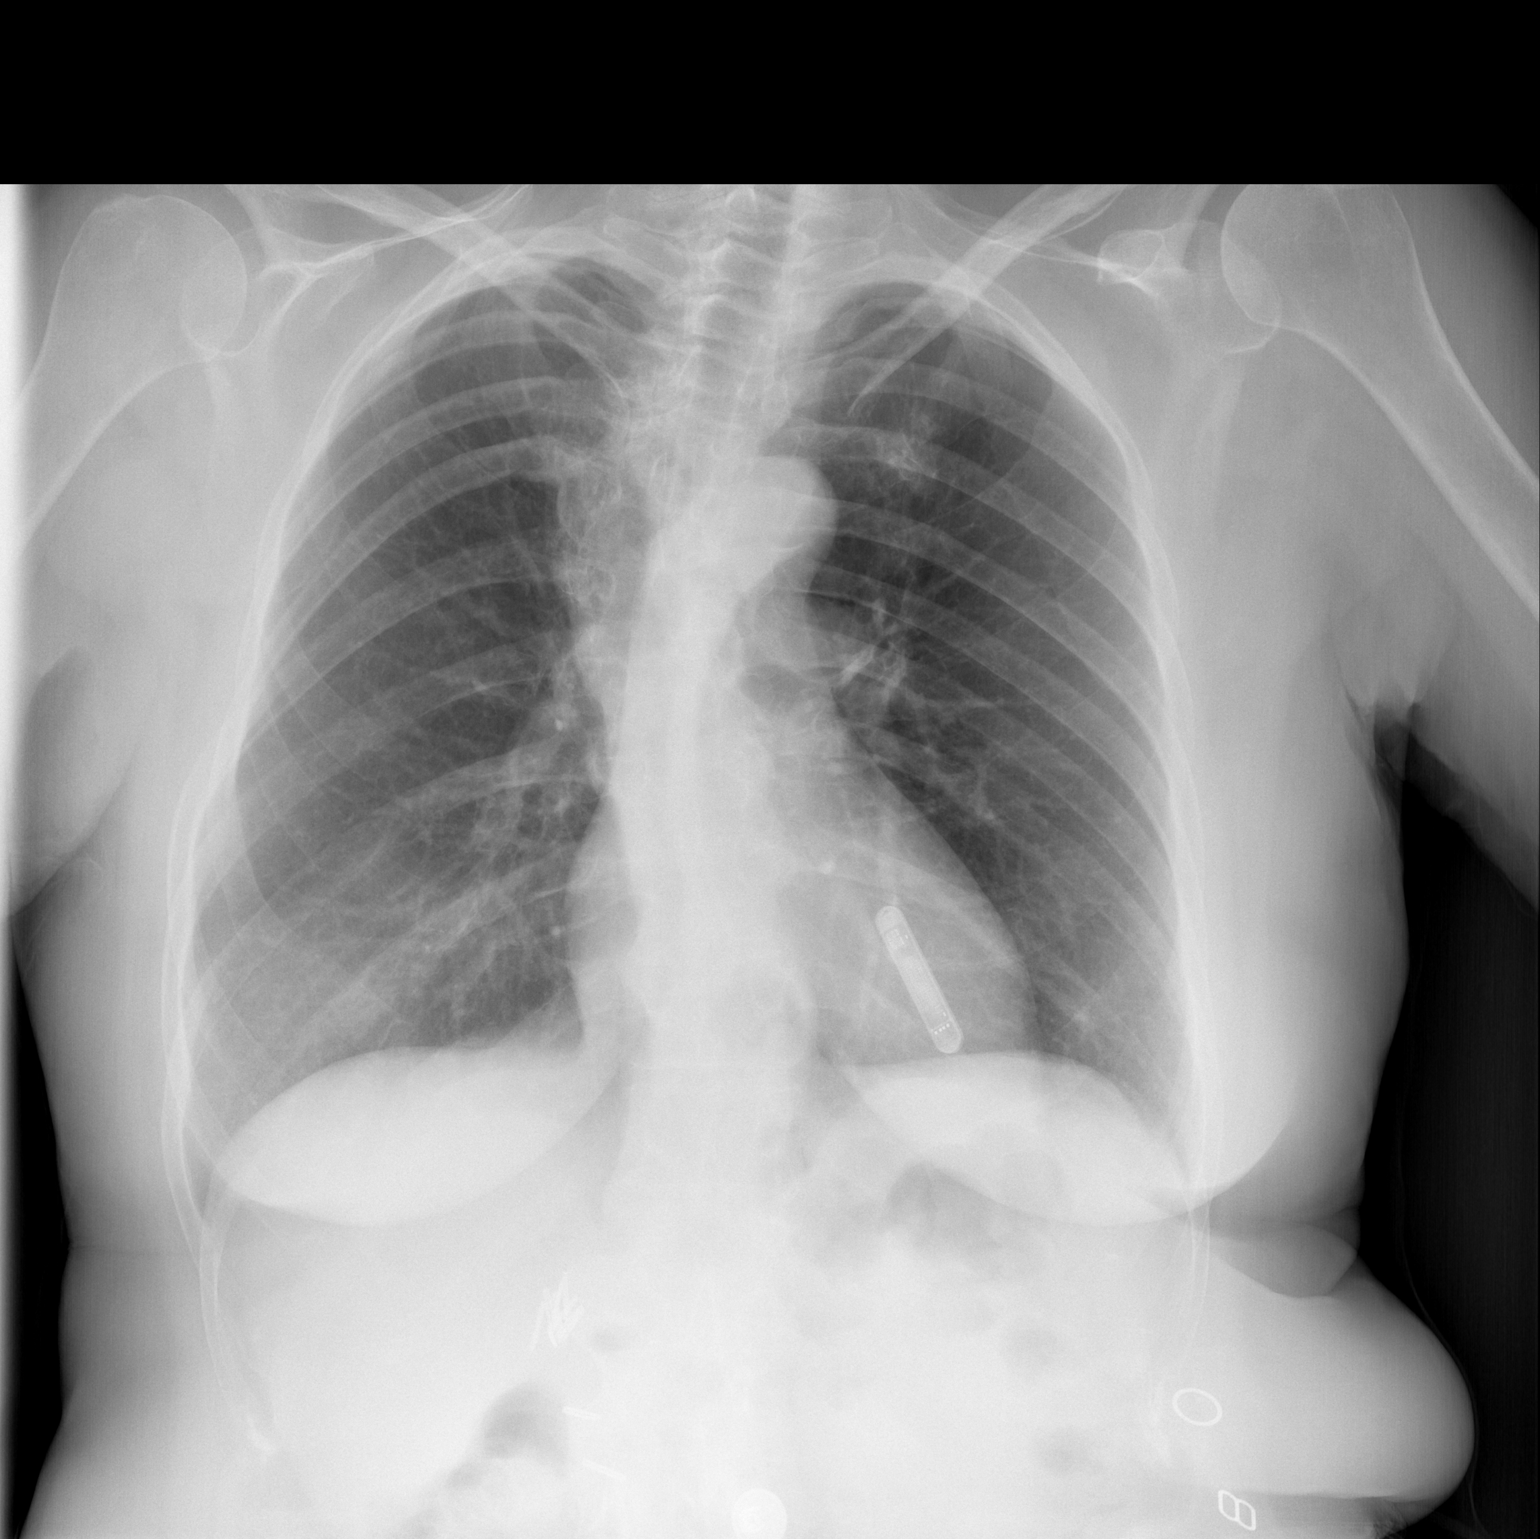

[w chest lat *]
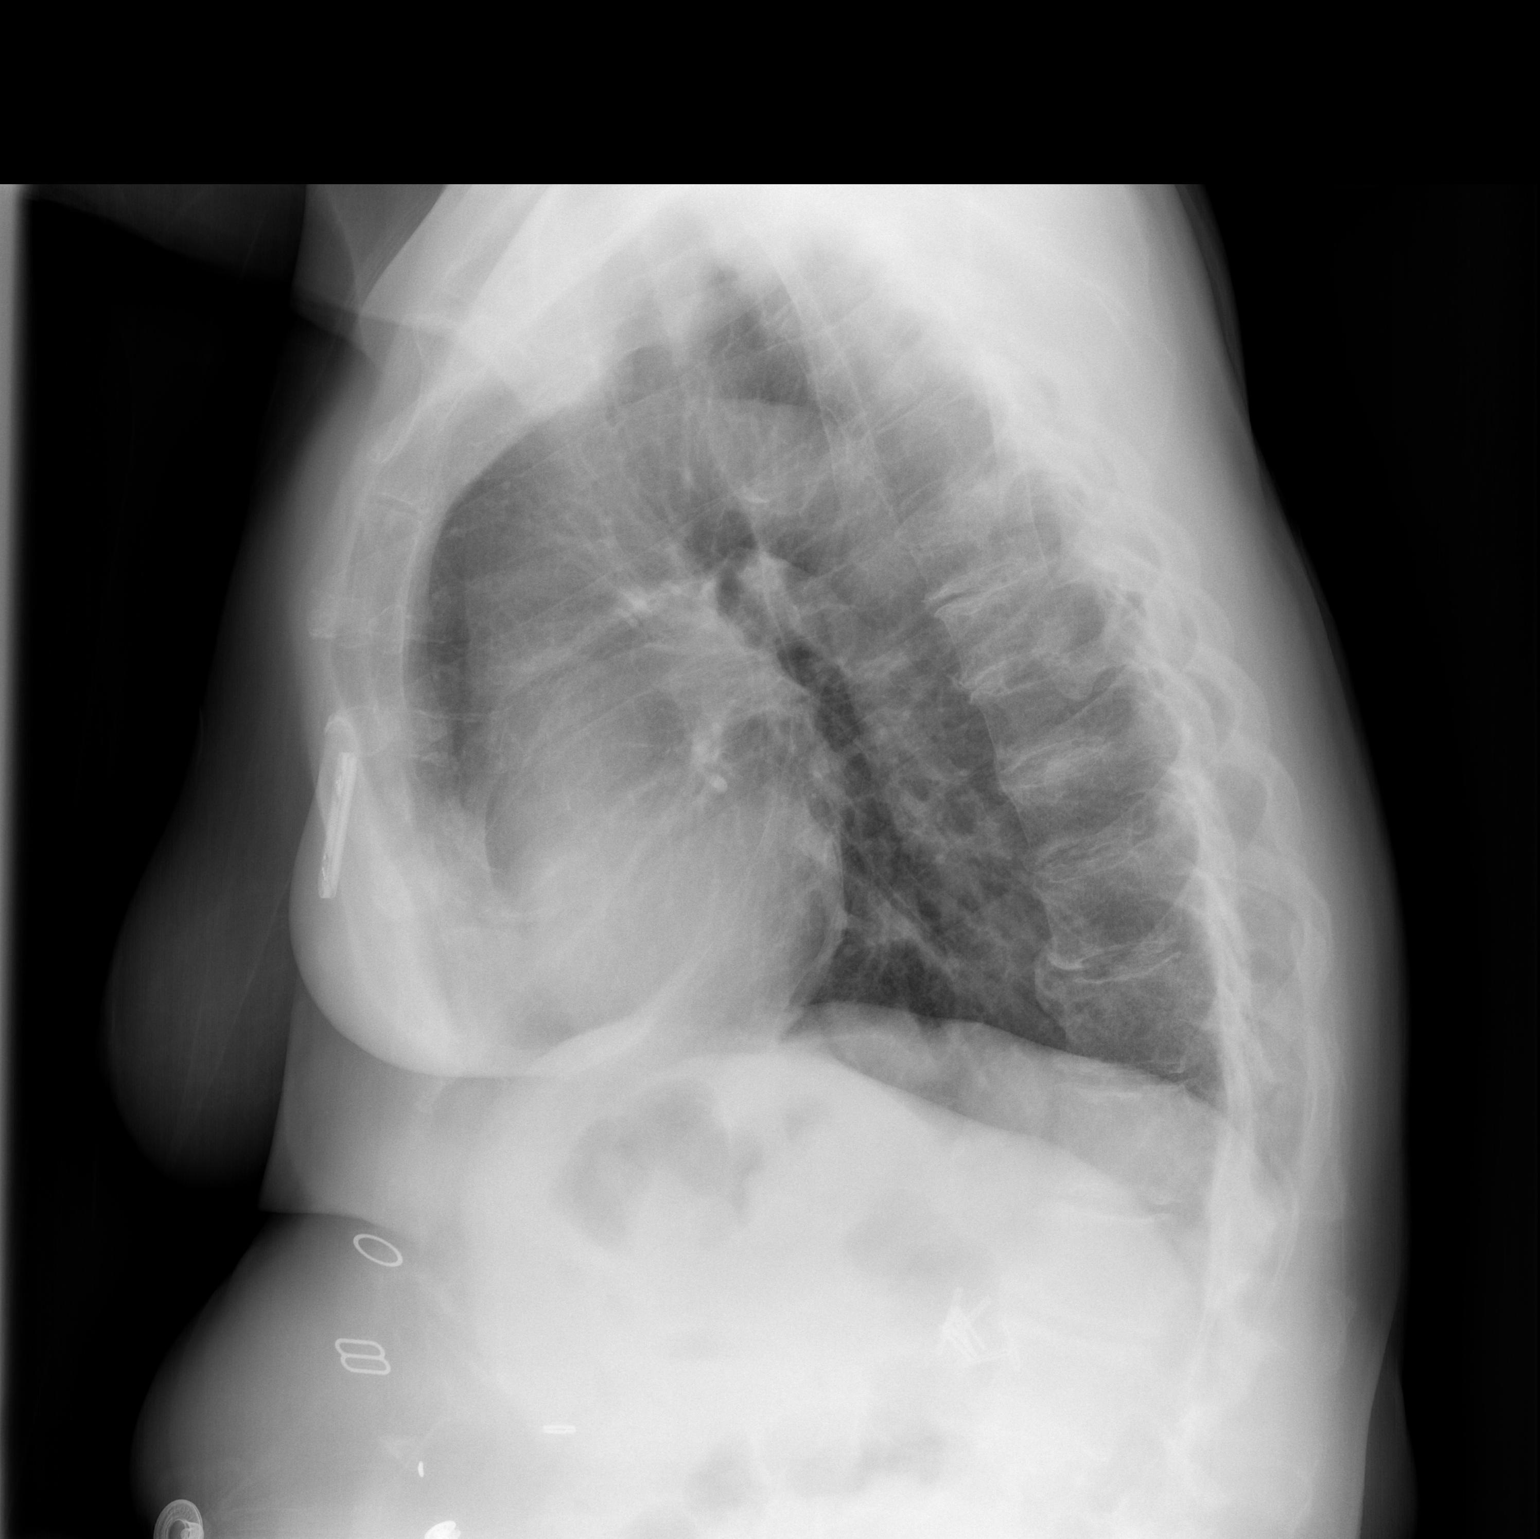

[2 of 2 positions shown; findings below may reference images not displayed]

FINDINGS: Cardiomediastinal silhouette unchanged in size and contour. No
evidence of central vascular congestion. No interlobular septal
thickening.

Event recorder on the left chest wall.

No pneumothorax or pleural effusion. Coarsened interstitial
markings, with no confluent airspace disease.

No acute displaced fracture. Scoliotic curvature of the spine with
associated multilevel degenerative changes.
IMPRESSION: Chronic lung changes without evidence of acute cardiopulmonary
disease

## 2022-02-09 DIAGNOSIS — M25511 Pain in right shoulder: Secondary | ICD-10-CM | POA: Diagnosis not present

## 2022-02-11 ENCOUNTER — Ambulatory Visit (INDEPENDENT_AMBULATORY_CARE_PROVIDER_SITE_OTHER): Payer: Medicare HMO

## 2022-02-11 DIAGNOSIS — I63031 Cerebral infarction due to thrombosis of right carotid artery: Secondary | ICD-10-CM | POA: Diagnosis not present

## 2022-02-11 DIAGNOSIS — E785 Hyperlipidemia, unspecified: Secondary | ICD-10-CM | POA: Diagnosis not present

## 2022-02-11 DIAGNOSIS — K5900 Constipation, unspecified: Secondary | ICD-10-CM | POA: Diagnosis not present

## 2022-02-11 DIAGNOSIS — N183 Chronic kidney disease, stage 3 unspecified: Secondary | ICD-10-CM | POA: Diagnosis not present

## 2022-02-11 DIAGNOSIS — M25511 Pain in right shoulder: Secondary | ICD-10-CM | POA: Diagnosis not present

## 2022-02-11 DIAGNOSIS — Z0000 Encounter for general adult medical examination without abnormal findings: Secondary | ICD-10-CM | POA: Diagnosis not present

## 2022-02-11 DIAGNOSIS — K219 Gastro-esophageal reflux disease without esophagitis: Secondary | ICD-10-CM | POA: Diagnosis not present

## 2022-02-11 DIAGNOSIS — R7301 Impaired fasting glucose: Secondary | ICD-10-CM | POA: Diagnosis not present

## 2022-02-11 LAB — CUP PACEART REMOTE DEVICE CHECK
Date Time Interrogation Session: 20230830231144
Implantable Pulse Generator Implant Date: 20211217

## 2022-02-11 NOTE — Progress Notes (Signed)
[facility] Summary Report / Loop Recorder 

## 2022-02-16 DIAGNOSIS — M25511 Pain in right shoulder: Secondary | ICD-10-CM | POA: Diagnosis not present

## 2022-02-19 DIAGNOSIS — M25511 Pain in right shoulder: Secondary | ICD-10-CM | POA: Diagnosis not present

## 2022-02-27 DIAGNOSIS — M25531 Pain in right wrist: Secondary | ICD-10-CM | POA: Diagnosis not present

## 2022-03-04 NOTE — Progress Notes (Signed)
[facility] Summary Report / Loop Recorder 

## 2022-03-05 DIAGNOSIS — M25531 Pain in right wrist: Secondary | ICD-10-CM | POA: Diagnosis not present

## 2022-03-12 ENCOUNTER — Other Ambulatory Visit: Payer: Self-pay | Admitting: Family Medicine

## 2022-03-12 DIAGNOSIS — Z1231 Encounter for screening mammogram for malignant neoplasm of breast: Secondary | ICD-10-CM

## 2022-03-16 ENCOUNTER — Ambulatory Visit (INDEPENDENT_AMBULATORY_CARE_PROVIDER_SITE_OTHER): Payer: Medicare HMO

## 2022-03-16 DIAGNOSIS — I63031 Cerebral infarction due to thrombosis of right carotid artery: Secondary | ICD-10-CM

## 2022-03-16 LAB — CUP PACEART REMOTE DEVICE CHECK
Date Time Interrogation Session: 20231002231453
Implantable Pulse Generator Implant Date: 20211217

## 2022-03-26 NOTE — Progress Notes (Signed)
[facility] Summary Report / Loop Recorder 

## 2022-04-01 DIAGNOSIS — M545 Low back pain, unspecified: Secondary | ICD-10-CM | POA: Diagnosis not present

## 2022-04-09 ENCOUNTER — Ambulatory Visit: Payer: Medicare HMO | Attending: Internal Medicine | Admitting: Internal Medicine

## 2022-04-09 ENCOUNTER — Encounter: Payer: Self-pay | Admitting: Internal Medicine

## 2022-04-09 VITALS — BP 124/62 | HR 76 | Ht 65.0 in | Wt 148.0 lb

## 2022-04-09 DIAGNOSIS — E785 Hyperlipidemia, unspecified: Secondary | ICD-10-CM | POA: Diagnosis not present

## 2022-04-09 DIAGNOSIS — Z95818 Presence of other cardiac implants and grafts: Secondary | ICD-10-CM | POA: Diagnosis not present

## 2022-04-09 DIAGNOSIS — Z8673 Personal history of transient ischemic attack (TIA), and cerebral infarction without residual deficits: Secondary | ICD-10-CM

## 2022-04-09 NOTE — Patient Instructions (Signed)
Medication Instructions:  Your physician recommends that you continue on your current medications as directed. Please refer to the Current Medication list given to you today.  *If you need a refill on your cardiac medications before your next appointment, please call your pharmacy*   Lab Work: NONE If you have labs (blood work) drawn today and your tests are completely normal, you will receive your results only by: [place] (if you have MyChart) OR A paper copy in the mail If you have any lab test that is abnormal or we need to change your treatment, we will call you to review the results.   Testing/Procedures: NONE   Follow-Up: At [facility], you and your health needs are our priority.  As part of our continuing mission to provide you with exceptional heart care, we have created designated Provider Care Teams.  These Care Teams include your primary Cardiologist (physician) and Advanced Practice Providers (APPs -  Physician Assistants and Nurse Practitioners) who all work together to provide you with the care you need, when you need it.   Your next appointment:   1 year(s)  The format for your next appointment:   In Person  Provider:   Werner Lean, MD      Important Information About Sugar

## 2022-04-09 NOTE — Progress Notes (Signed)
Cardiology Office Note:    Date:  04/09/2022   ID:  [name], DOB 05-Feb-1941, MRN [id number]  PCP:  Kristen Loader, [place] [facility] Cardiologist:  Werner Lean, MD     Referring MD: Kristen Loader, FNP   CC: Transition to new cardiologist  History of Present Illness:    [name] is a 81 y.o. female with a hx of prior stroke, HLD, and hx of kidney cancer.  Also hx of GERD. Saw Dr. Tamala Julian in 2022.  Has non cardiac chest pain.  Patient notes that she is doing poorly.   Feels 9 days ago. Her feet was cold. Put her shoes on to go to the rest room; but on th wrong foot. Fracture her tailbone. Has the donut. II weeks ago hit her right shoulder bringing this into the house.    No chest pain or pressure .  No SOB/DOE. No palpitations or syncope .   Past Medical History:  Diagnosis Date   Blood transfusion without reported diagnosis    with hip fracture-81 years old   Cancer [facility]) 1983   Kidney Cancer   Diverticulosis    GERD (gastroesophageal reflux disease)    Personal history of kidney cancer     Past Surgical History:  Procedure Laterality Date   ABDOMINAL HYSTERECTOMY     COLON SURGERY     Severe sigmoid diverticulitis   COLONOSCOPY     FOOT SURGERY     HIP SURGERY     Kidney cancer     Kidney removed   LOOP RECORDER INSERTION N/A 05/30/2020   Procedure: LOOP RECORDER INSERTION;  Surgeon: Vickie Epley, MD;  Location: [facility];  Service: Cardiovascular;  Laterality: N/A;   POLYPECTOMY      Current Medications: Current Meds  Medication Sig   acetaminophen (TYLENOL) 650 MG CR tablet Take 650 mg by mouth daily.   aspirin EC 81 MG EC tablet Take 1 tablet (81 mg total) by mouth daily. Swallow whole.   atorvastatin (LIPITOR) 40 MG tablet Take 40 mg by mouth daily.   clopidogrel (PLAVIX) 75 MG tablet Take 75 mg by mouth daily.   cyclobenzaprine (FLEXERIL) 5 MG tablet TAKE ONE TABLET BY MOUTH THREE TIMES A  DAY AS NEEDED FOR MUSCLE SPASMS   famotidine (PEPCID) 20 MG tablet Take 20 mg by mouth 2 (two) times daily.   MAGNESIUM PO Take 400 mg by mouth every evening.   Multiple Vitamins-Minerals (MULTIVITAMIN WITH MINERALS) tablet Take 1 tablet by mouth daily.   pantoprazole (PROTONIX) 40 MG tablet Take 1 tablet (40 mg total) by mouth daily.   PARoxetine (PAXIL) 10 MG tablet Take 10 mg by mouth daily.   polyethylene glycol (MIRALAX / GLYCOLAX) 17 g packet Take 17 g by mouth daily.   senna-docusate (SENOKOT-S) 8.6-50 MG tablet Take 1 tablet by mouth 2 (two) times daily.   vitamin C (ASCORBIC ACID) 500 MG tablet Take 500 mg by mouth daily.     Allergies:   Codeine   Social History   Socioeconomic History   Marital status: Married    Spouse name: Not on file   Number of children: Not on file   Years of education: Not on file   Highest education level: Not on file  Occupational History   Not on file  Tobacco Use   Smoking status: Never   Smokeless tobacco: Never  Vaping Use   Vaping Use: Never used  Substance and Sexual Activity   Alcohol use: No   Drug use: No   Sexual activity: Not on file  Other Topics Concern   Not on file  Social History Narrative   Not on file   Social Determinants of Health   Financial Resource Strain: Not on file  Food Insecurity: Not on file  Transportation Needs: Not on file  Physical Activity: Not on file  Stress: Not on file  Social Connections: Not on file     Family History: The patient's family history includes Colon cancer in her mother; Coronary artery disease in her father. There is no history of Breast cancer.  ROS:   Please see the history of present illness.     All other systems reviewed and are negative.  EKGs/Labs/Other Studies Reviewed:    The following studies were reviewed today:  EKG:  EKG is  ordered today.  The ekg ordered today demonstrates  04/09/22: SR   Cardiac Studies & Procedures        ECHOCARDIOGRAM  ECHOCARDIOGRAM COMPLETE 05/27/2020  Narrative ECHOCARDIOGRAM REPORT    Patient Name:   [name] Date of Exam: 05/27/2020 Medical Rec #:  [id number]      Height:       64.5 in Accession #:    [id number]     Weight:       155.0 lb Date of Birth:  [birth year]      BSA:          1.765 m Patient Age:    52 years       BP:           155/75 mmHg Patient Gender: F              HR:           67 bpm. Exam Location:  Inpatient  Procedure: 2D Echo  Indications:    stroke 434.91  History:        Patient has no prior history of Echocardiogram examinations.  Sonographer:    Johny Chess Referring Phys: [id number] OLADAPO ADEFESO  IMPRESSIONS   1. Left ventricular ejection fraction, by estimation, is 60 to 65%. The left ventricle has normal function. The left ventricle has no regional wall motion abnormalities. Left ventricular diastolic parameters are consistent with Grade I diastolic dysfunction (impaired relaxation). 2. Right ventricular systolic function is normal. The right ventricular size is normal. There is normal pulmonary artery systolic pressure. 3. The mitral valve is normal in structure. Trivial mitral valve regurgitation. 4. The aortic valve is tricuspid. There is mild calcification of the aortic valve. There is mild thickening of the aortic valve. Aortic valve regurgitation is not visualized. Mild aortic valve sclerosis is present, with no evidence of aortic valve stenosis. 5. The inferior vena cava is normal in size with greater than 50% respiratory variability, suggesting right atrial pressure of 3 mmHg.  Comparison(s): No prior Echocardiogram.  Conclusion(s)/Recommendation(s): No intracardiac source of embolism detected on this transthoracic study. A transesophageal echocardiogram is recommended to exclude cardiac source of embolism if clinically indicated.  FINDINGS Left Ventricle: Left ventricular ejection fraction, by estimation, is 60 to 65%.  The left ventricle has normal function. The left ventricle has no regional wall motion abnormalities. The left ventricular internal cavity size was normal in size. There is borderline concentric left ventricular hypertrophy. Left ventricular diastolic parameters are consistent with Grade I diastolic dysfunction (impaired relaxation).  Right Ventricle: The right ventricular size is normal. No increase in right  ventricular wall thickness. Right ventricular systolic function is normal. There is normal pulmonary artery systolic pressure. The tricuspid regurgitant velocity is 1.98 m/s, and with an assumed right atrial pressure of 3 mmHg, the estimated right ventricular systolic pressure is 62.2 mmHg.  Left Atrium: Left atrial size was normal in size.  Right Atrium: Right atrial size was normal in size.  Pericardium: There is no evidence of pericardial effusion.  Mitral Valve: The mitral valve is normal in structure. There is mild thickening of the mitral valve leaflet(s). Mild mitral annular calcification. Trivial mitral valve regurgitation.  Tricuspid Valve: The tricuspid valve is normal in structure. Tricuspid valve regurgitation is trivial.  Aortic Valve: The aortic valve is tricuspid. There is mild calcification of the aortic valve. There is mild thickening of the aortic valve. There is mild aortic valve annular calcification. Aortic valve regurgitation is not visualized. Mild aortic valve sclerosis is present, with no evidence of aortic valve stenosis.  Pulmonic Valve: The pulmonic valve was normal in structure. Pulmonic valve regurgitation is not visualized.  Aorta: The aortic root is normal in size and structure.  Venous: The inferior vena cava is normal in size with greater than 50% respiratory variability, suggesting right atrial pressure of 3 mmHg.  IAS/Shunts: No atrial level shunt detected by color flow Doppler.   LEFT VENTRICLE PLAX 2D LVIDd:         3.60 cm Diastology LVIDs:          2.10 cm LV e' medial:    6.09 cm/s LV PW:         1.00 cm LV E/e' medial:  13.9 LV IVS:        1.00 cm LV e' lateral:   8.16 cm/s LV E/e' lateral: 10.4   RIGHT VENTRICLE             IVC RV S prime:     17.80 cm/s  IVC diam: 1.30 cm TAPSE (M-mode): 2.3 cm  LEFT ATRIUM             Index       RIGHT ATRIUM          Index LA diam:        3.40 cm 1.93 cm/m  RA Area:     9.99 cm LA Vol (A2C):   47.9 ml 27.14 ml/m RA Volume:   19.60 ml 11.11 ml/m LA Vol (A4C):   41.7 ml 23.63 ml/m LA Biplane Vol: 45.0 ml 25.50 ml/m AORTIC VALVE LVOT Vmax:   93.80 cm/s LVOT Vmean:  62.700 cm/s LVOT VTI:    0.224 m  MITRAL VALVE                TRICUSPID VALVE MV Area (PHT): 2.45 cm     TR Peak grad:   15.7 mmHg MV Decel Time: 310 msec     TR Vmax:        198.00 cm/s MV E velocity: 84.60 cm/s MV A velocity: 104.00 cm/s  SHUNTS MV E/A ratio:  0.81         Systemic VTI: 0.22 m  Gwyndolyn Kaufman MD Electronically signed by Gwyndolyn Kaufman MD Signature Date/Time: 05/27/2020/11:11:34 AM    Final               Recent Labs: No results found for requested labs within last 365 days.  Recent Lipid Panel    Component Value Date/Time   CHOL 234 (H) 05/28/2020 0334   TRIG 135 05/28/2020 0334   HDL  68 05/28/2020 0334   CHOLHDL 3.4 05/28/2020 0334   VLDL 27 05/28/2020 0334   LDLCALC 139 (H) 05/28/2020 0334           Physical Exam:    VS:  BP 124/62   Pulse 76   Ht '5\' 5"'$  (1.651 m)   Wt 148 lb (67.1 kg)   SpO2 95%   BMI 24.63 kg/m     Wt Readings from Last 3 Encounters:  04/09/22 148 lb (67.1 kg)  05/13/21 156 lb (70.8 kg)  02/26/21 150 lb 6.4 oz (68.2 kg)    GEN:  Well nourished, well developed in no acute distress HEENT: Normal NECK: No JVD LYMPHATICS: No lymphadenopathy CARDIAC: RRR, no murmurs, rubs, gallops RESPIRATORY:  Clear to auscultation without rales, wheezing or rhonchi  ABDOMEN: Soft, non-tender, non-distended MUSCULOSKELETAL:  No edema; No deformity   SKIN: notes skin issues from fall right heart  NEUROLOGIC:  Alert and oriented x 3 PSYCHIATRIC:  Normal affect   ASSESSMENT:    1. Hyperlipidemia LDL goal <70   2. History of stroke   3. Implantable loop recorder present    PLAN:    History of stroke HLD S/p ILR Recent mechanical fall - continue current medications, LDL 71, at next visit will increase to atorvastatin to 80 - will continue ASA for now - she is on plavix as well; no strong indication given timing of stroke; this may be stopped in future visits  One year me or APP        Medication Adjustments/Labs and Tests Ordered: Current medicines are reviewed at length with the patient today.  Concerns regarding medicines are outlined above.  Orders Placed This Encounter  Procedures   EKG 12-Lead   No orders of the defined types were placed in this encounter.   Patient Instructions  Medication Instructions:  Your physician recommends that you continue on your current medications as directed. Please refer to the Current Medication list given to you today.  *If you need a refill on your cardiac medications before your next appointment, please call your pharmacy*   Lab Work: NONE If you have labs (blood work) drawn today and your tests are completely normal, you will receive your results only by: [place] (if you have MyChart) OR A paper copy in the mail If you have any lab test that is abnormal or we need to change your treatment, we will call you to review the results.   Testing/Procedures: NONE   Follow-Up: At [facility], you and your health needs are our priority.  As part of our continuing mission to provide you with exceptional heart care, we have created designated Provider Care Teams.  These Care Teams include your primary Cardiologist (physician) and Advanced Practice Providers (APPs -  Physician Assistants and Nurse Practitioners) who all work together to provide you with the care  you need, when you need it.   Your next appointment:   1 year(s)  The format for your next appointment:   In Person  Provider:   Werner Lean, MD      Important Information About Sugar         Signed, Werner Lean, MD  04/09/2022 4:33 PM    [place]

## 2022-04-14 ENCOUNTER — Ambulatory Visit
Admission: RE | Admit: 2022-04-14 | Discharge: 2022-04-14 | Disposition: A | Discharge status: Home / Self Care | Payer: Medicare HMO | Source: Ambulatory Visit | Attending: Family Medicine | Admitting: Family Medicine

## 2022-04-14 DIAGNOSIS — Z1231 Encounter for screening mammogram for malignant neoplasm of breast: Secondary | ICD-10-CM

## 2022-04-27 DIAGNOSIS — M25511 Pain in right shoulder: Secondary | ICD-10-CM | POA: Diagnosis not present

## 2022-04-27 DIAGNOSIS — M25512 Pain in left shoulder: Secondary | ICD-10-CM | POA: Diagnosis not present

## 2022-05-03 ENCOUNTER — Encounter (INDEPENDENT_AMBULATORY_CARE_PROVIDER_SITE_OTHER): Payer: Medicare HMO | Admitting: Ophthalmology

## 2022-05-12 DIAGNOSIS — L821 Other seborrheic keratosis: Secondary | ICD-10-CM | POA: Diagnosis not present

## 2022-05-12 DIAGNOSIS — D2239 Melanocytic nevi of other parts of face: Secondary | ICD-10-CM | POA: Diagnosis not present

## 2022-05-12 DIAGNOSIS — D225 Melanocytic nevi of trunk: Secondary | ICD-10-CM | POA: Diagnosis not present

## 2022-05-12 DIAGNOSIS — D224 Melanocytic nevi of scalp and neck: Secondary | ICD-10-CM | POA: Diagnosis not present

## 2022-05-12 DIAGNOSIS — L814 Other melanin hyperpigmentation: Secondary | ICD-10-CM | POA: Diagnosis not present

## 2022-05-12 DIAGNOSIS — Z85828 Personal history of other malignant neoplasm of skin: Secondary | ICD-10-CM | POA: Diagnosis not present

## 2022-05-12 DIAGNOSIS — D2272 Melanocytic nevi of left lower limb, including hip: Secondary | ICD-10-CM | POA: Diagnosis not present

## 2022-05-12 DIAGNOSIS — D692 Other nonthrombocytopenic purpura: Secondary | ICD-10-CM | POA: Diagnosis not present

## 2022-05-14 DIAGNOSIS — J029 Acute pharyngitis, unspecified: Secondary | ICD-10-CM | POA: Diagnosis not present

## 2022-05-14 DIAGNOSIS — H00011 Hordeolum externum right upper eyelid: Secondary | ICD-10-CM | POA: Diagnosis not present

## 2022-05-14 DIAGNOSIS — Z03818 Encounter for observation for suspected exposure to other biological agents ruled out: Secondary | ICD-10-CM | POA: Diagnosis not present

## 2022-05-14 DIAGNOSIS — R051 Acute cough: Secondary | ICD-10-CM | POA: Diagnosis not present

## 2022-05-24 ENCOUNTER — Ambulatory Visit (INDEPENDENT_AMBULATORY_CARE_PROVIDER_SITE_OTHER): Payer: Medicare HMO

## 2022-05-24 DIAGNOSIS — I63031 Cerebral infarction due to thrombosis of right carotid artery: Secondary | ICD-10-CM

## 2022-05-24 LAB — CUP PACEART REMOTE DEVICE CHECK
Date Time Interrogation Session: 20231210230726
Implantable Pulse Generator Implant Date: 20211217

## 2022-05-25 DIAGNOSIS — K219 Gastro-esophageal reflux disease without esophagitis: Secondary | ICD-10-CM | POA: Diagnosis not present

## 2022-05-25 DIAGNOSIS — N183 Chronic kidney disease, stage 3 unspecified: Secondary | ICD-10-CM | POA: Diagnosis not present

## 2022-05-25 DIAGNOSIS — E1122 Type 2 diabetes mellitus with diabetic chronic kidney disease: Secondary | ICD-10-CM | POA: Diagnosis not present

## 2022-05-25 DIAGNOSIS — E785 Hyperlipidemia, unspecified: Secondary | ICD-10-CM | POA: Diagnosis not present

## 2022-05-27 DIAGNOSIS — D3131 Benign neoplasm of right choroid: Secondary | ICD-10-CM | POA: Diagnosis not present

## 2022-05-27 DIAGNOSIS — H35373 Puckering of macula, bilateral: Secondary | ICD-10-CM | POA: Diagnosis not present

## 2022-05-27 DIAGNOSIS — H43813 Vitreous degeneration, bilateral: Secondary | ICD-10-CM | POA: Diagnosis not present

## 2022-05-27 DIAGNOSIS — H35412 Lattice degeneration of retina, left eye: Secondary | ICD-10-CM | POA: Diagnosis not present

## 2022-06-09 DIAGNOSIS — Z20828 Contact with and (suspected) exposure to other viral communicable diseases: Secondary | ICD-10-CM | POA: Diagnosis not present

## 2022-06-09 DIAGNOSIS — Z20822 Contact with and (suspected) exposure to covid-19: Secondary | ICD-10-CM | POA: Diagnosis not present

## 2022-06-22 DIAGNOSIS — R42 Dizziness and giddiness: Secondary | ICD-10-CM | POA: Diagnosis not present

## 2022-06-22 DIAGNOSIS — J320 Chronic maxillary sinusitis: Secondary | ICD-10-CM | POA: Diagnosis not present

## 2022-06-22 DIAGNOSIS — N183 Chronic kidney disease, stage 3 unspecified: Secondary | ICD-10-CM | POA: Diagnosis not present

## 2022-06-22 DIAGNOSIS — R7301 Impaired fasting glucose: Secondary | ICD-10-CM | POA: Diagnosis not present

## 2022-06-22 DIAGNOSIS — E1122 Type 2 diabetes mellitus with diabetic chronic kidney disease: Secondary | ICD-10-CM | POA: Diagnosis not present

## 2022-06-25 NOTE — Progress Notes (Signed)
[facility] Summary Report / Loop Recorder

## 2022-06-28 ENCOUNTER — Ambulatory Visit (INDEPENDENT_AMBULATORY_CARE_PROVIDER_SITE_OTHER): Payer: No Typology Code available for payment source

## 2022-06-28 DIAGNOSIS — I63031 Cerebral infarction due to thrombosis of right carotid artery: Secondary | ICD-10-CM

## 2022-06-29 LAB — CUP PACEART REMOTE DEVICE CHECK
Date Time Interrogation Session: 20240112230927
Implantable Pulse Generator Implant Date: 20211217

## 2022-07-28 DIAGNOSIS — D3131 Benign neoplasm of right choroid: Secondary | ICD-10-CM | POA: Diagnosis not present

## 2022-07-28 DIAGNOSIS — H04123 Dry eye syndrome of bilateral lacrimal glands: Secondary | ICD-10-CM | POA: Diagnosis not present

## 2022-07-28 DIAGNOSIS — H524 Presbyopia: Secondary | ICD-10-CM | POA: Diagnosis not present

## 2022-07-28 DIAGNOSIS — H35373 Puckering of macula, bilateral: Secondary | ICD-10-CM | POA: Diagnosis not present

## 2022-07-30 LAB — CUP PACEART REMOTE DEVICE CHECK
Date Time Interrogation Session: 20240214230919
Implantable Pulse Generator Implant Date: 20211217

## 2022-08-02 ENCOUNTER — Ambulatory Visit: Payer: No Typology Code available for payment source

## 2022-08-02 DIAGNOSIS — I63031 Cerebral infarction due to thrombosis of right carotid artery: Secondary | ICD-10-CM

## 2022-08-03 DIAGNOSIS — M25511 Pain in right shoulder: Secondary | ICD-10-CM | POA: Diagnosis not present

## 2022-08-05 NOTE — Progress Notes (Signed)
[facility] Summary Report / Loop Recorder 

## 2022-09-03 DIAGNOSIS — Z0100 Encounter for examination of eyes and vision without abnormal findings: Secondary | ICD-10-CM | POA: Diagnosis not present

## 2022-09-06 ENCOUNTER — Ambulatory Visit: Payer: No Typology Code available for payment source

## 2022-09-06 DIAGNOSIS — I63031 Cerebral infarction due to thrombosis of right carotid artery: Secondary | ICD-10-CM

## 2022-09-06 LAB — CUP PACEART REMOTE DEVICE CHECK
Date Time Interrogation Session: 20240324231939
Implantable Pulse Generator Implant Date: 20211217

## 2022-09-13 NOTE — Progress Notes (Signed)
[facility] Summary Report / Loop Recorder 

## 2022-09-21 DIAGNOSIS — R42 Dizziness and giddiness: Secondary | ICD-10-CM | POA: Diagnosis not present

## 2022-09-21 DIAGNOSIS — E785 Hyperlipidemia, unspecified: Secondary | ICD-10-CM | POA: Diagnosis not present

## 2022-09-21 DIAGNOSIS — E1122 Type 2 diabetes mellitus with diabetic chronic kidney disease: Secondary | ICD-10-CM | POA: Diagnosis not present

## 2022-09-21 DIAGNOSIS — Z6825 Body mass index (BMI) 25.0-25.9, adult: Secondary | ICD-10-CM | POA: Diagnosis not present

## 2022-10-11 ENCOUNTER — Ambulatory Visit (INDEPENDENT_AMBULATORY_CARE_PROVIDER_SITE_OTHER): Payer: No Typology Code available for payment source

## 2022-10-11 DIAGNOSIS — I63031 Cerebral infarction due to thrombosis of right carotid artery: Secondary | ICD-10-CM

## 2022-10-11 LAB — CUP PACEART REMOTE DEVICE CHECK
Date Time Interrogation Session: 20240426230217
Implantable Pulse Generator Implant Date: 20211217

## 2022-10-14 NOTE — Progress Notes (Signed)
[facility] Summary Report / Loop Recorder 

## 2022-11-10 NOTE — Progress Notes (Signed)
[facility] Summary Report / Loop Recorder 

## 2022-11-15 ENCOUNTER — Ambulatory Visit (INDEPENDENT_AMBULATORY_CARE_PROVIDER_SITE_OTHER): Payer: No Typology Code available for payment source

## 2022-11-15 DIAGNOSIS — I639 Cerebral infarction, unspecified: Secondary | ICD-10-CM

## 2022-11-15 LAB — CUP PACEART REMOTE DEVICE CHECK
Date Time Interrogation Session: 20240602231426
Implantable Pulse Generator Implant Date: 20211217

## 2022-11-30 DIAGNOSIS — M7062 Trochanteric bursitis, left hip: Secondary | ICD-10-CM | POA: Diagnosis not present

## 2022-11-30 DIAGNOSIS — M1612 Unilateral primary osteoarthritis, left hip: Secondary | ICD-10-CM | POA: Diagnosis not present

## 2022-11-30 DIAGNOSIS — M1711 Unilateral primary osteoarthritis, right knee: Secondary | ICD-10-CM | POA: Diagnosis not present

## 2022-12-07 NOTE — Progress Notes (Signed)
[facility] Summary Report / Loop Recorder 

## 2022-12-20 ENCOUNTER — Ambulatory Visit (INDEPENDENT_AMBULATORY_CARE_PROVIDER_SITE_OTHER): Payer: No Typology Code available for payment source

## 2022-12-20 DIAGNOSIS — I63031 Cerebral infarction due to thrombosis of right carotid artery: Secondary | ICD-10-CM

## 2022-12-20 LAB — CUP PACEART REMOTE DEVICE CHECK
Date Time Interrogation Session: 20240705230625
Implantable Pulse Generator Implant Date: 20211217

## 2022-12-29 DIAGNOSIS — Z9181 History of falling: Secondary | ICD-10-CM | POA: Diagnosis not present

## 2022-12-29 DIAGNOSIS — Z0000 Encounter for general adult medical examination without abnormal findings: Secondary | ICD-10-CM | POA: Diagnosis not present

## 2022-12-29 DIAGNOSIS — Z1389 Encounter for screening for other disorder: Secondary | ICD-10-CM | POA: Diagnosis not present

## 2023-01-04 NOTE — Progress Notes (Signed)
[facility] Summary Report / Loop Recorder 

## 2023-01-07 DIAGNOSIS — M25512 Pain in left shoulder: Secondary | ICD-10-CM | POA: Diagnosis not present

## 2023-01-07 DIAGNOSIS — M25511 Pain in right shoulder: Secondary | ICD-10-CM | POA: Diagnosis not present

## 2023-01-24 ENCOUNTER — Ambulatory Visit (INDEPENDENT_AMBULATORY_CARE_PROVIDER_SITE_OTHER): Payer: No Typology Code available for payment source

## 2023-01-24 DIAGNOSIS — I63031 Cerebral infarction due to thrombosis of right carotid artery: Secondary | ICD-10-CM | POA: Diagnosis not present

## 2023-02-04 NOTE — Progress Notes (Signed)
[facility] Summary Report / Loop Recorder 

## 2023-02-23 DIAGNOSIS — N183 Chronic kidney disease, stage 3 unspecified: Secondary | ICD-10-CM | POA: Diagnosis not present

## 2023-02-23 DIAGNOSIS — E1122 Type 2 diabetes mellitus with diabetic chronic kidney disease: Secondary | ICD-10-CM | POA: Diagnosis not present

## 2023-02-23 DIAGNOSIS — E785 Hyperlipidemia, unspecified: Secondary | ICD-10-CM | POA: Diagnosis not present

## 2023-02-23 DIAGNOSIS — Z0000 Encounter for general adult medical examination without abnormal findings: Secondary | ICD-10-CM | POA: Diagnosis not present

## 2023-02-23 DIAGNOSIS — M5430 Sciatica, unspecified side: Secondary | ICD-10-CM | POA: Diagnosis not present

## 2023-02-23 DIAGNOSIS — K219 Gastro-esophageal reflux disease without esophagitis: Secondary | ICD-10-CM | POA: Diagnosis not present

## 2023-02-28 ENCOUNTER — Ambulatory Visit (INDEPENDENT_AMBULATORY_CARE_PROVIDER_SITE_OTHER): Payer: No Typology Code available for payment source

## 2023-02-28 DIAGNOSIS — I63031 Cerebral infarction due to thrombosis of right carotid artery: Secondary | ICD-10-CM

## 2023-02-28 LAB — CUP PACEART REMOTE DEVICE CHECK
Date Time Interrogation Session: 20240913230245
Implantable Pulse Generator Implant Date: 20211217

## 2023-03-14 NOTE — Progress Notes (Signed)
[facility] Summary Report / Loop Recorder 

## 2023-04-04 ENCOUNTER — Ambulatory Visit: Payer: No Typology Code available for payment source

## 2023-04-04 DIAGNOSIS — I63031 Cerebral infarction due to thrombosis of right carotid artery: Secondary | ICD-10-CM

## 2023-04-04 LAB — CUP PACEART REMOTE DEVICE CHECK
Date Time Interrogation Session: 20241020230355
Implantable Pulse Generator Implant Date: 20211217

## 2023-04-14 DIAGNOSIS — Z23 Encounter for immunization: Secondary | ICD-10-CM | POA: Diagnosis not present

## 2023-04-19 NOTE — Progress Notes (Signed)
[facility] Summary Report / Loop Recorder 

## 2023-04-25 ENCOUNTER — Ambulatory Visit: Payer: No Typology Code available for payment source | Admitting: Internal Medicine

## 2023-04-25 NOTE — Progress Notes (Deleted)
Cardiology Office Note:    Date:  04/25/2023   ID:  [name], DOB 03-Aug-1940, MRN [id number]  PCP:  Soundra Pilon, FNP   [facility] Providers Cardiologist:  Christell Constant, MD     Referring MD: Soundra Pilon, FNP   CC: Transition to new cardiologist  History of Present Illness:    [name] is a 82 y.o. female with a hx of prior stroke, HLD, and hx of kidney cancer.  Also hx of GERD. Saw Dr. Katrinka Blazing in 2022.  Has non cardiac chest pain.  Patient notes that she is doing poorly.   Feels 9 days ago. Her feet was cold. Put her shoes on to go to the rest room; but on th wrong foot. Fracture her tailbone. Has the donut. II weeks ago hit her right shoulder bringing this into the house.    No chest pain or pressure .  No SOB/DOE. No palpitations or syncope .   Past Medical History:  Diagnosis Date   Blood transfusion without reported diagnosis    with hip fracture-82 years old   Cancer [facility]) 1983   Kidney Cancer   Diverticulosis    GERD (gastroesophageal reflux disease)    Personal history of kidney cancer     Past Surgical History:  Procedure Laterality Date   ABDOMINAL HYSTERECTOMY     COLON SURGERY     Severe sigmoid diverticulitis   COLONOSCOPY     FOOT SURGERY     HIP SURGERY     Kidney cancer     Kidney removed   LOOP RECORDER INSERTION N/A 05/30/2020   Procedure: LOOP RECORDER INSERTION;  Surgeon: Lanier Prude, MD;  Location: [facility];  Service: Cardiovascular;  Laterality: N/A;   POLYPECTOMY      Current Medications: No outpatient medications have been marked as taking for the 04/25/23 encounter (Appointment) with Christell Constant, MD.     Allergies:   Codeine   Social History   Socioeconomic History   Marital status: Married    Spouse name: Not on file   Number of children: Not on file   Years of education: Not on file   Highest education level: Not on file  Occupational History   Not on  file  Tobacco Use   Smoking status: Never   Smokeless tobacco: Never  Vaping Use   Vaping status: Never Used  Substance and Sexual Activity   Alcohol use: No   Drug use: No   Sexual activity: Not on file  Other Topics Concern   Not on file  Social History Narrative   Not on file   Social Determinants of Health   Financial Resource Strain: Not on file  Food Insecurity: Not on file  Transportation Needs: Not on file  Physical Activity: Not on file  Stress: Not on file  Social Connections: Not on file     Family History: The patient's family history includes Colon cancer in her mother; Coronary artery disease in her father. There is no history of Breast cancer.  ROS:   Please see the history of present illness.     All other systems reviewed and are negative.  EKGs/Labs/Other Studies Reviewed:    The following studies were reviewed today:  EKG:  EKG is  ordered today.  The ekg ordered today demonstrates  04/09/22: SR   Cardiac Studies & Procedures       ECHOCARDIOGRAM  ECHOCARDIOGRAM COMPLETE 05/27/2020  Narrative ECHOCARDIOGRAM  REPORT    Patient Name:   [name] Date of Exam: 05/27/2020 Medical Rec #:  [id number]      Height:       64.5 in Accession #:    [id number]     Weight:       155.0 lb Date of Birth:  [birth year]      BSA:          1.765 m Patient Age:    79 years       BP:           155/75 mmHg Patient Gender: F              HR:           67 bpm. Exam Location:  Inpatient  Procedure: 2D Echo  Indications:    stroke 434.91  History:        Patient has no prior history of Echocardiogram examinations.  Sonographer:    Delcie Roch Referring Phys: [id number] OLADAPO ADEFESO  IMPRESSIONS   1. Left ventricular ejection fraction, by estimation, is 60 to 65%. The left ventricle has normal function. The left ventricle has no regional wall motion abnormalities. Left ventricular diastolic parameters are consistent with Grade I  diastolic dysfunction (impaired relaxation). 2. Right ventricular systolic function is normal. The right ventricular size is normal. There is normal pulmonary artery systolic pressure. 3. The mitral valve is normal in structure. Trivial mitral valve regurgitation. 4. The aortic valve is tricuspid. There is mild calcification of the aortic valve. There is mild thickening of the aortic valve. Aortic valve regurgitation is not visualized. Mild aortic valve sclerosis is present, with no evidence of aortic valve stenosis. 5. The inferior vena cava is normal in size with greater than 50% respiratory variability, suggesting right atrial pressure of 3 mmHg.  Comparison(s): No prior Echocardiogram.  Conclusion(s)/Recommendation(s): No intracardiac source of embolism detected on this transthoracic study. A transesophageal echocardiogram is recommended to exclude cardiac source of embolism if clinically indicated.  FINDINGS Left Ventricle: Left ventricular ejection fraction, by estimation, is 60 to 65%. The left ventricle has normal function. The left ventricle has no regional wall motion abnormalities. The left ventricular internal cavity size was normal in size. There is borderline concentric left ventricular hypertrophy. Left ventricular diastolic parameters are consistent with Grade I diastolic dysfunction (impaired relaxation).  Right Ventricle: The right ventricular size is normal. No increase in right ventricular wall thickness. Right ventricular systolic function is normal. There is normal pulmonary artery systolic pressure. The tricuspid regurgitant velocity is 1.98 m/s, and with an assumed right atrial pressure of 3 mmHg, the estimated right ventricular systolic pressure is 18.7 mmHg.  Left Atrium: Left atrial size was normal in size.  Right Atrium: Right atrial size was normal in size.  Pericardium: There is no evidence of pericardial effusion.  Mitral Valve: The mitral valve is normal in  structure. There is mild thickening of the mitral valve leaflet(s). Mild mitral annular calcification. Trivial mitral valve regurgitation.  Tricuspid Valve: The tricuspid valve is normal in structure. Tricuspid valve regurgitation is trivial.  Aortic Valve: The aortic valve is tricuspid. There is mild calcification of the aortic valve. There is mild thickening of the aortic valve. There is mild aortic valve annular calcification. Aortic valve regurgitation is not visualized. Mild aortic valve sclerosis is present, with no evidence of aortic valve stenosis.  Pulmonic Valve: The pulmonic valve was normal in structure. Pulmonic valve regurgitation is not visualized.  Aorta: The aortic  root is normal in size and structure.  Venous: The inferior vena cava is normal in size with greater than 50% respiratory variability, suggesting right atrial pressure of 3 mmHg.  IAS/Shunts: No atrial level shunt detected by color flow Doppler.   LEFT VENTRICLE PLAX 2D LVIDd:         3.60 cm Diastology LVIDs:         2.10 cm LV e' medial:    6.09 cm/s LV PW:         1.00 cm LV E/e' medial:  13.9 LV IVS:        1.00 cm LV e' lateral:   8.16 cm/s LV E/e' lateral: 10.4   RIGHT VENTRICLE             IVC RV S prime:     17.80 cm/s  IVC diam: 1.30 cm TAPSE (M-mode): 2.3 cm  LEFT ATRIUM             Index       RIGHT ATRIUM          Index LA diam:        3.40 cm 1.93 cm/m  RA Area:     9.99 cm LA Vol (A2C):   47.9 ml 27.14 ml/m RA Volume:   19.60 ml 11.11 ml/m LA Vol (A4C):   41.7 ml 23.63 ml/m LA Biplane Vol: 45.0 ml 25.50 ml/m AORTIC VALVE LVOT Vmax:   93.80 cm/s LVOT Vmean:  62.700 cm/s LVOT VTI:    0.224 m  MITRAL VALVE                TRICUSPID VALVE MV Area (PHT): 2.45 cm     TR Peak grad:   15.7 mmHg MV Decel Time: 310 msec     TR Vmax:        198.00 cm/s MV E velocity: 84.60 cm/s MV A velocity: 104.00 cm/s  SHUNTS MV E/A ratio:  0.81         Systemic VTI: 0.22 m  Laurance Flatten  MD Electronically signed by Laurance Flatten MD Signature Date/Time: 05/27/2020/11:11:34 AM    Final               Recent Labs: No results found for requested labs within last 365 days.  Recent Lipid Panel    Component Value Date/Time   CHOL 234 (H) 05/28/2020 0334   TRIG 135 05/28/2020 0334   HDL 68 05/28/2020 0334   CHOLHDL 3.4 05/28/2020 0334   VLDL 27 05/28/2020 0334   LDLCALC 139 (H) 05/28/2020 0334   No BP recorded.  {Refresh Note OR Click here to enter BP  :1}***       Physical Exam:    VS:  There were no vitals taken for this visit.    Wt Readings from Last 3 Encounters:  04/09/22 148 lb (67.1 kg)  05/13/21 156 lb (70.8 kg)  02/26/21 150 lb 6.4 oz (68.2 kg)    GEN:  Well nourished, well developed in no acute distress HEENT: Normal NECK: No JVD LYMPHATICS: No lymphadenopathy CARDIAC: RRR, no murmurs, rubs, gallops RESPIRATORY:  Clear to auscultation without rales, wheezing or rhonchi  ABDOMEN: Soft, non-tender, non-distended MUSCULOSKELETAL:  No edema; No deformity  SKIN: notes skin issues from fall right heart  NEUROLOGIC:  Alert and oriented x 3 PSYCHIATRIC:  Normal affect   ASSESSMENT:    No diagnosis found.  PLAN:    History of stroke HLD S/p ILR Recent mechanical fall - continue current  medications, LDL 71, at next visit will increase to atorvastatin to 80 - will continue ASA for now - she is on plavix as well; no strong indication given timing of stroke; this may be stopped in future visits  One year me or APP        Medication Adjustments/Labs and Tests Ordered: Current medicines are reviewed at length with the patient today.  Concerns regarding medicines are outlined above.  No orders of the defined types were placed in this encounter.  No orders of the defined types were placed in this encounter.   There are no Patient Instructions on file for this visit.   Signed, Christell Constant, MD  04/25/2023 12:55 PM    [facility]

## 2023-04-29 DIAGNOSIS — E785 Hyperlipidemia, unspecified: Secondary | ICD-10-CM | POA: Diagnosis not present

## 2023-04-29 DIAGNOSIS — Z8673 Personal history of transient ischemic attack (TIA), and cerebral infarction without residual deficits: Secondary | ICD-10-CM | POA: Diagnosis not present

## 2023-04-29 DIAGNOSIS — Z85528 Personal history of other malignant neoplasm of kidney: Secondary | ICD-10-CM | POA: Diagnosis not present

## 2023-04-29 DIAGNOSIS — K219 Gastro-esophageal reflux disease without esophagitis: Secondary | ICD-10-CM | POA: Diagnosis not present

## 2023-04-29 DIAGNOSIS — Z008 Encounter for other general examination: Secondary | ICD-10-CM | POA: Diagnosis not present

## 2023-04-29 DIAGNOSIS — Z6823 Body mass index (BMI) 23.0-23.9, adult: Secondary | ICD-10-CM | POA: Diagnosis not present

## 2023-04-29 DIAGNOSIS — D692 Other nonthrombocytopenic purpura: Secondary | ICD-10-CM | POA: Diagnosis not present

## 2023-05-03 ENCOUNTER — Encounter: Payer: Self-pay | Admitting: Internal Medicine

## 2023-05-03 ENCOUNTER — Ambulatory Visit: Payer: No Typology Code available for payment source | Attending: Internal Medicine | Admitting: Internal Medicine

## 2023-05-03 VITALS — BP 140/64 | HR 62 | Ht 64.0 in | Wt 143.0 lb

## 2023-05-03 DIAGNOSIS — E785 Hyperlipidemia, unspecified: Secondary | ICD-10-CM

## 2023-05-03 DIAGNOSIS — Z95818 Presence of other cardiac implants and grafts: Secondary | ICD-10-CM

## 2023-05-03 DIAGNOSIS — M7989 Other specified soft tissue disorders: Secondary | ICD-10-CM | POA: Diagnosis not present

## 2023-05-03 DIAGNOSIS — Z8673 Personal history of transient ischemic attack (TIA), and cerebral infarction without residual deficits: Secondary | ICD-10-CM | POA: Diagnosis not present

## 2023-05-03 DIAGNOSIS — I63031 Cerebral infarction due to thrombosis of right carotid artery: Secondary | ICD-10-CM | POA: Diagnosis not present

## 2023-05-03 MED ORDER — FUROSEMIDE 20 MG PO TABS: 20.0000 mg | ORAL_TABLET | ORAL | 3 refills | Status: DC

## 2023-05-03 NOTE — Patient Instructions (Signed)
Medication Instructions:  Your physician has recommended you make the following change in your medication:  START: furosemide (Lasix) 20 mg by mouth every other day  *If you need a refill on your cardiac medications before your next appointment, please call your pharmacy*   Lab Work: ON May 16 2023:  BNP, CMP, FLP (nothing to eat or drink 12 hours before except water)  If you have labs (blood work) drawn today and your tests are completely normal, you will receive your results only by: MyChart Message (if you have MyChart) OR A paper copy in the mail If you have any lab test that is abnormal or we need to change your treatment, we will call you to review the results.   Testing/Procedures: Your physician has requested that you have an echocardiogram. Echocardiography is a painless test that uses sound waves to create images of your heart. It provides your doctor with information about the size and shape of your heart and how well your heart's chambers and valves are working. This procedure takes approximately one hour. There are no restrictions for this procedure. Please do NOT wear cologne, perfume, aftershave, or lotions (deodorant is allowed). Please arrive 15 minutes prior to your appointment time.  Please note: We ask at that you not bring children with you during ultrasound (echo/ vascular) testing. Due to room size and safety concerns, children are not allowed in the ultrasound rooms during exams. Our front office staff cannot provide observation of children in our lobby area while testing is being conducted. An adult accompanying a patient to their appointment will only be allowed in the ultrasound room at the discretion of the ultrasound technician under special circumstances. We apologize for any inconvenience.    Follow-Up: At [facility], you and your health needs are our priority.  As part of our continuing mission to provide you with exceptional heart care, we have  created designated Provider Care Teams.  These Care Teams include your primary Cardiologist (physician) and Advanced Practice Providers (APPs -  Physician Assistants and Nurse Practitioners) who all work together to provide you with the care you need, when you need it.  We recommend signing up for the patient portal called "MyChart".  Sign up information is provided on this After Visit Summary.  MyChart is used to connect with patients for Virtual Visits (Telemedicine).  Patients are able to view lab/test results, encounter notes, upcoming appointments, etc.  Non-urgent messages can be sent to your provider as well.   To learn more about what you can do with MyChart, go to [email or web address].    Your next appointment:   3 month(s)  Provider:   Jacolyn Reedy, PA-C, Eligha Bridegroom, NP, or Tereso Newcomer, PA-C

## 2023-05-03 NOTE — Progress Notes (Signed)
Cardiology Office Note:    Date:  05/03/2023   ID:  [name], DOB 1940-07-21, MRN [id number]  PCP:  Soundra Pilon, FNP   [facility] Providers Cardiologist:  Christell Constant, MD     Referring MD: Soundra Pilon, FNP   CC: One year f/u  History of Present Illness:    [name] is a 82 y.o. female with a hx of prior stroke, HLD, and hx of kidney cancer.  Also hx of GERD. Saw Dr. Katrinka Blazing in 2022.  Has non cardiac chest pain. 2023: Had mechanical fall in 2023  [name], an 82 year old patient with a history of stroke, elevated cholesterol, and mechanical falls, presents with new onset lower extremity swelling. The patient reports noticing the swelling in the past week, with the left leg appearing more swollen than the right. The patient attributes the difference in size to a previous fracture in the left leg.  In addition to the new swelling, the patient has a history of mechanical falls, the most recent of as after her last stroke  The patient also has a history of stroke, for which she is on dual antiplatelet therapy (DAPT) as per her neurologist. The patient reports occasional headaches, which cause concern due to the history of stroke.  The patient has an implantable loop recorder (ILR) for cardiac monitoring, which was implanted three years ago following a non-cardiac chest pain episode. The patient expresses a desire to have the ILR removed due to discomfort.  The patient's cholesterol levels have been slightly elevated despite her history of stroke. The patient is currently on atorvastatin for cholesterol management.  The patient's history also includes a mechanical fall, which is being monitored with an ILR by an electrophysiologist. The patient's cholesterol levels are slightly elevated, and there is a plan to increase the dose of atorvastatin.  Past Medical History:  Diagnosis Date   Blood transfusion without reported diagnosis    with hip  fracture-82 years old   Cancer [facility]) 1983   Kidney Cancer   Diverticulosis    GERD (gastroesophageal reflux disease)    Personal history of kidney cancer     Past Surgical History:  Procedure Laterality Date   ABDOMINAL HYSTERECTOMY     COLON SURGERY     Severe sigmoid diverticulitis   COLONOSCOPY     FOOT SURGERY     HIP SURGERY     Kidney cancer     Kidney removed   LOOP RECORDER INSERTION N/A 05/30/2020   Procedure: LOOP RECORDER INSERTION;  Surgeon: Lanier Prude, MD;  Location: [facility] INVASIVE CV LAB;  Service: Cardiovascular;  Laterality: N/A;   POLYPECTOMY      Current Medications: Current Meds  Medication Sig   acetaminophen (TYLENOL) 650 MG CR tablet Take 650 mg by mouth daily.   aspirin EC 81 MG EC tablet Take 1 tablet (81 mg total) by mouth daily. Swallow whole.   atorvastatin (LIPITOR) 40 MG tablet Take 40 mg by mouth daily.   clopidogrel (PLAVIX) 75 MG tablet Take 75 mg by mouth daily.   cyclobenzaprine (FLEXERIL) 5 MG tablet TAKE ONE TABLET BY MOUTH THREE TIMES A DAY AS NEEDED FOR MUSCLE SPASMS   famotidine (PEPCID) 20 MG tablet Take 20 mg by mouth 2 (two) times daily.   furosemide (LASIX) 20 MG tablet Take 1 tablet (20 mg total) by mouth every other day.   MAGNESIUM PO Take 400 mg by mouth every evening.   Multiple Vitamins-Minerals (  MULTIVITAMIN WITH MINERALS) tablet Take 1 tablet by mouth daily.   pantoprazole (PROTONIX) 40 MG tablet Take 1 tablet (40 mg total) by mouth daily.   PARoxetine (PAXIL) 10 MG tablet Take 10 mg by mouth daily.   polyethylene glycol (MIRALAX / GLYCOLAX) 17 g packet Take 17 g by mouth daily.   senna-docusate (SENOKOT-S) 8.6-50 MG tablet Take 1 tablet by mouth 2 (two) times daily.   vitamin C (ASCORBIC ACID) 500 MG tablet Take 500 mg by mouth daily.     Allergies:   Codeine   Social History   Socioeconomic History   Marital status: Married    Spouse name: Not on file   Number of children: Not on file   Years of education: Not  on file   Highest education level: Not on file  Occupational History   Not on file  Tobacco Use   Smoking status: Never   Smokeless tobacco: Never  Vaping Use   Vaping status: Never Used  Substance and Sexual Activity   Alcohol use: No   Drug use: No   Sexual activity: Not on file  Other Topics Concern   Not on file  Social History Narrative   Not on file   Social Determinants of Health   Financial Resource Strain: Not on file  Food Insecurity: Not on file  Transportation Needs: Not on file  Physical Activity: Not on file  Stress: Not on file  Social Connections: Not on file     Family History: The patient's family history includes Colon cancer in her mother; Coronary artery disease in her father. There is no history of Breast cancer.  ROS:   Please see the history of present illness.     EKGs/Labs/Other Studies Reviewed:    The following studies were reviewed today:  04/09/22: SR   Cardiac Studies & Procedures       ECHOCARDIOGRAM  ECHOCARDIOGRAM COMPLETE 05/27/2020  Narrative ECHOCARDIOGRAM REPORT    Patient Name:   [name] Date of Exam: 05/27/2020 Medical Rec #:  [id number]      Height:       64.5 in Accession #:    [id number]     Weight:       155.0 lb Date of Birth:  June 27, 1940      BSA:          1.765 m Patient Age:    79 years       BP:           155/75 mmHg Patient Gender: F              HR:           67 bpm. Exam Location:  Inpatient  Procedure: 2D Echo  Indications:    stroke 434.91  History:        Patient has no prior history of Echocardiogram examinations.  Sonographer:    Delcie Roch Referring Phys: [id number] OLADAPO ADEFESO  IMPRESSIONS   1. Left ventricular ejection fraction, by estimation, is 60 to 65%. The left ventricle has normal function. The left ventricle has no regional wall motion abnormalities. Left ventricular diastolic parameters are consistent with Grade I diastolic dysfunction (impaired relaxation). 2.  Right ventricular systolic function is normal. The right ventricular size is normal. There is normal pulmonary artery systolic pressure. 3. The mitral valve is normal in structure. Trivial mitral valve regurgitation. 4. The aortic valve is tricuspid. There is mild calcification of the aortic valve. There is mild thickening  of the aortic valve. Aortic valve regurgitation is not visualized. Mild aortic valve sclerosis is present, with no evidence of aortic valve stenosis. 5. The inferior vena cava is normal in size with greater than 50% respiratory variability, suggesting right atrial pressure of 3 mmHg.  Comparison(s): No prior Echocardiogram.  Conclusion(s)/Recommendation(s): No intracardiac source of embolism detected on this transthoracic study. A transesophageal echocardiogram is recommended to exclude cardiac source of embolism if clinically indicated.  FINDINGS Left Ventricle: Left ventricular ejection fraction, by estimation, is 60 to 65%. The left ventricle has normal function. The left ventricle has no regional wall motion abnormalities. The left ventricular internal cavity size was normal in size. There is borderline concentric left ventricular hypertrophy. Left ventricular diastolic parameters are consistent with Grade I diastolic dysfunction (impaired relaxation).  Right Ventricle: The right ventricular size is normal. No increase in right ventricular wall thickness. Right ventricular systolic function is normal. There is normal pulmonary artery systolic pressure. The tricuspid regurgitant velocity is 1.98 m/s, and with an assumed right atrial pressure of 3 mmHg, the estimated right ventricular systolic pressure is 18.7 mmHg.  Left Atrium: Left atrial size was normal in size.  Right Atrium: Right atrial size was normal in size.  Pericardium: There is no evidence of pericardial effusion.  Mitral Valve: The mitral valve is normal in structure. There is mild thickening of the mitral  valve leaflet(s). Mild mitral annular calcification. Trivial mitral valve regurgitation.  Tricuspid Valve: The tricuspid valve is normal in structure. Tricuspid valve regurgitation is trivial.  Aortic Valve: The aortic valve is tricuspid. There is mild calcification of the aortic valve. There is mild thickening of the aortic valve. There is mild aortic valve annular calcification. Aortic valve regurgitation is not visualized. Mild aortic valve sclerosis is present, with no evidence of aortic valve stenosis.  Pulmonic Valve: The pulmonic valve was normal in structure. Pulmonic valve regurgitation is not visualized.  Aorta: The aortic root is normal in size and structure.  Venous: The inferior vena cava is normal in size with greater than 50% respiratory variability, suggesting right atrial pressure of 3 mmHg.  IAS/Shunts: No atrial level shunt detected by color flow Doppler.   LEFT VENTRICLE PLAX 2D LVIDd:         3.60 cm Diastology LVIDs:         2.10 cm LV e' medial:    6.09 cm/s LV PW:         1.00 cm LV E/e' medial:  13.9 LV IVS:        1.00 cm LV e' lateral:   8.16 cm/s LV E/e' lateral: 10.4   RIGHT VENTRICLE             IVC RV S prime:     17.80 cm/s  IVC diam: 1.30 cm TAPSE (M-mode): 2.3 cm  LEFT ATRIUM             Index       RIGHT ATRIUM          Index LA diam:        3.40 cm 1.93 cm/m  RA Area:     9.99 cm LA Vol (A2C):   47.9 ml 27.14 ml/m RA Volume:   19.60 ml 11.11 ml/m LA Vol (A4C):   41.7 ml 23.63 ml/m LA Biplane Vol: 45.0 ml 25.50 ml/m AORTIC VALVE LVOT Vmax:   93.80 cm/s LVOT Vmean:  62.700 cm/s LVOT VTI:    0.224 m  MITRAL VALVE  TRICUSPID VALVE MV Area (PHT): 2.45 cm     TR Peak grad:   15.7 mmHg MV Decel Time: 310 msec     TR Vmax:        198.00 cm/s MV E velocity: 84.60 cm/s MV A velocity: 104.00 cm/s  SHUNTS MV E/A ratio:  0.81         Systemic VTI: 0.22 m  Laurance Flatten MD Electronically signed by Laurance Flatten  MD Signature Date/Time: 05/27/2020/11:11:34 AM    Final               Recent Labs: No results found for requested labs within last 365 days.  Recent Lipid Panel    Component Value Date/Time   CHOL 234 (H) 05/28/2020 0334   TRIG 135 05/28/2020 0334   HDL 68 05/28/2020 0334   CHOLHDL 3.4 05/28/2020 0334   VLDL 27 05/28/2020 0334   LDLCALC 139 (H) 05/28/2020 0334       Physical Exam:    VS:  BP (!) 140/64   Pulse 62   Ht 5\' 4"  (1.626 m)   Wt 143 lb (64.9 kg)   SpO2 98%   BMI 24.55 kg/m     Wt Readings from Last 3 Encounters:  05/03/23 143 lb (64.9 kg)  04/09/22 148 lb (67.1 kg)  05/13/21 156 lb (70.8 kg)    GEN:  Well nourished, well developed in no acute distress HEENT: Normal  CARDIAC: RRR, no murmurs, rubs, gallops RESPIRATORY:  Clear to auscultation without rales, wheezing or rhonchi  ABDOMEN: Soft, non-tender, non-distended MUSCULOSKELETAL:  new +1 bilateral edema; No deformity  SKIN: notes skin issues from fall right heart  NEUROLOGIC:  Alert and oriented x 3 PSYCHIATRIC:  Normal affect   ASSESSMENT:    1. 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 Hyperlipidemia LDL goal <70   3. History of stroke   4. Implantable loop recorder present   5. Leg swelling     PLAN:    Lower Extremity Edema New onset lower extremity swelling, right leg more affected. Possible causes include fluid retention, hypoalbuminemia, or heart failure. Discussed Lasix for fluid management and compression stockings for cost-effective options. Informed that persistent fluid retention may require more aggressive treatment. - Start Lasix 20 mg PO every 48 hours - Order echocardiogram - Order BNP - Order complete metabolic panel - Order fasting lipids - labs for ~ 2 weeks on q48 diuretics  Hypertension - Elevated blood pressure noted. Hesitant to add new antihypertensives due to history of mechanical falls. Discussed monitoring blood  pressure and evaluating response to diuretic for potential reduction. - Monitor blood pressure - Evaluate response to diuretic for potential blood pressure reduction  Stroke Right-sided stroke, currently on dual antiplatelet therapy managed by neurology. No new neurological symptoms. Discussed increasing atorvastatin to 80 mg for elevated cholesterol. More aggressive treatment may be necessary if cholesterol remains high. - Defer antiplatelet therapy management to neurology - Check labs after Thanksgiving to assess cholesterol levels and adjust atorvastatin dose if necessary  Implantable Loop Recorder (ILR) - ILR in place for three years post-mechanical fall. No significant findings. Patient desires removal.  - Send courtesy message to Dr. Lalla Brothers regarding ILR removal; as she was leaving she was no longer she she wants this; I have asked her to reach out to [place] if she wishes for it to be removed   Follow-up - Schedule lab tests for the Monday or Tuesday after Thanksgiving -  Follow up with PA or NP in three months to review fluid management, echocardiogram results, and ILR status.           Medication Adjustments/Labs and Tests Ordered: Current medicines are reviewed at length with the patient today.  Concerns regarding medicines are outlined above.  Orders Placed This Encounter  Procedures   Comprehensive metabolic panel   Pro b natriuretic peptide (BNP)   Lipid panel   EKG 12-Lead   ECHOCARDIOGRAM COMPLETE   Meds ordered this encounter  Medications   furosemide (LASIX) 20 MG tablet    Sig: Take 1 tablet (20 mg total) by mouth every other day.    Dispense:  45 tablet    Refill:  3    Patient Instructions  Medication Instructions:  Your physician has recommended you make the following change in your medication:  START: furosemide (Lasix) 20 mg by mouth every other day  *If you need a refill on your cardiac medications before your next appointment, please call your  pharmacy*   Lab Work: ON May 16 2023:  BNP, CMP, FLP (nothing to eat or drink 12 hours before except water)  If you have labs (blood work) drawn today and your tests are completely normal, you will receive your results only by: MyChart Message (if you have MyChart) OR A paper copy in the mail If you have any lab test that is abnormal or we need to change your treatment, we will call you to review the results.   Testing/Procedures: Your physician has requested that you have an echocardiogram. Echocardiography is a painless test that uses sound waves to create images of your heart. It provides your doctor with information about the size and shape of your heart and how well your heart's chambers and valves are working. This procedure takes approximately one hour. There are no restrictions for this procedure. Please do NOT wear cologne, perfume, aftershave, or lotions (deodorant is allowed). Please arrive 15 minutes prior to your appointment time.  Please note: We ask at that you not bring children with you during ultrasound (echo/ vascular) testing. Due to room size and safety concerns, children are not allowed in the ultrasound rooms during exams. Our front office staff cannot provide observation of children in our lobby area while testing is being conducted. An adult accompanying a patient to their appointment will only be allowed in the ultrasound room at the discretion of the ultrasound technician under special circumstances. We apologize for any inconvenience.    Follow-Up: At [facility], you and your health needs are our priority.  As part of our continuing mission to provide you with exceptional heart care, we have created designated Provider Care Teams.  These Care Teams include your primary Cardiologist (physician) and Advanced Practice Providers (APPs -  Physician Assistants and Nurse Practitioners) who all work together to provide you with the care you need, when you need  it.  We recommend signing up for the patient portal called "MyChart".  Sign up information is provided on this After Visit Summary.  MyChart is used to connect with patients for Virtual Visits (Telemedicine).  Patients are able to view lab/test results, encounter notes, upcoming appointments, etc.  Non-urgent messages can be sent to your provider as well.   To learn more about what you can do with MyChart, go to [email or web address].    Your next appointment:   3 month(s)  Provider:   Jacolyn Reedy, PA-C, Eligha Bridegroom, NP, or Tereso Newcomer, PA-C  Signed, Christell Constant, MD  05/03/2023 3:49 PM    [facility]

## 2023-05-05 ENCOUNTER — Other Ambulatory Visit: Payer: Self-pay | Admitting: Family Medicine

## 2023-05-05 DIAGNOSIS — Z1231 Encounter for screening mammogram for malignant neoplasm of breast: Secondary | ICD-10-CM

## 2023-05-05 DIAGNOSIS — M1711 Unilateral primary osteoarthritis, right knee: Secondary | ICD-10-CM | POA: Diagnosis not present

## 2023-05-05 DIAGNOSIS — M25511 Pain in right shoulder: Secondary | ICD-10-CM | POA: Diagnosis not present

## 2023-05-09 ENCOUNTER — Ambulatory Visit (INDEPENDENT_AMBULATORY_CARE_PROVIDER_SITE_OTHER): Payer: No Typology Code available for payment source | Admitting: Podiatry

## 2023-05-09 ENCOUNTER — Ambulatory Visit (INDEPENDENT_AMBULATORY_CARE_PROVIDER_SITE_OTHER): Payer: No Typology Code available for payment source

## 2023-05-09 ENCOUNTER — Encounter: Payer: Self-pay | Admitting: Podiatry

## 2023-05-09 DIAGNOSIS — I63031 Cerebral infarction due to thrombosis of right carotid artery: Secondary | ICD-10-CM | POA: Diagnosis not present

## 2023-05-09 DIAGNOSIS — M779 Enthesopathy, unspecified: Secondary | ICD-10-CM

## 2023-05-09 DIAGNOSIS — B351 Tinea unguium: Secondary | ICD-10-CM | POA: Diagnosis not present

## 2023-05-09 DIAGNOSIS — D2371 Other benign neoplasm of skin of right lower limb, including hip: Secondary | ICD-10-CM

## 2023-05-09 NOTE — Progress Notes (Signed)
Chief Complaint  Patient presents with   Bunions    PATIENT STATES SHE HAS SOMETHING ON THE BOTTOM OF HER 5TH TOE R FOOT , TYLENOL FOR PAIN . IT HAS BEEN LIKE THIS FOR SOME TIME PATIENT STATES . PATIENT STATES THAT SHE HAS FUNGUS ON HER R FOOT THE FIRST 3 TOES HALLUX,4TH,AND 3RD TOE    Subjective: 82 y.o. female presenting to the office today for evaluation of pain and tenderness associated to the plantar aspect of the fifth MTP of the right foot secondary to a symptomatic skin lesion. Patient also has concern for discoloration and thickening to the toenails of the right foot that has been ongoing for few years now.  Gradual idiopathic onset.  She is concerned for toenail fungus  Past Medical History:  Diagnosis Date   Blood transfusion without reported diagnosis    with hip fracture-82 years old   Cancer [facility]) 1983   Kidney Cancer   Diverticulosis    GERD (gastroesophageal reflux disease)    Personal history of kidney cancer     Past Surgical History:  Procedure Laterality Date   ABDOMINAL HYSTERECTOMY     COLON SURGERY     Severe sigmoid diverticulitis   COLONOSCOPY     FOOT SURGERY     HIP SURGERY     Kidney cancer     Kidney removed   LOOP RECORDER INSERTION N/A 05/30/2020   Procedure: LOOP RECORDER INSERTION;  Surgeon: Lanier Prude, MD;  Location: [facility] INVASIVE CV LAB;  Service: Cardiovascular;  Laterality: N/A;   POLYPECTOMY      Allergies  Allergen Reactions   Codeine Other (See Comments)    Pt unsure if really allergic to this    RT foot 05/09/2023  Objective:  Physical Exam General: Alert and oriented x3 in no acute distress  Dermatology: Hyperkeratotic lesion(s) present on the plantar aspect of the fifth MTP of the right foot. Pain on palpation with a central nucleated core noted.  Hyperkeratotic dystrophic nails also noted 1-5 of the right foot consistent with onychomycosis  Vascular: Palpable pedal pulses bilaterally. No edema or erythema noted.  Capillary refill within normal limits.  Neurological: Grossly intact via light touch  Musculoskeletal Exam: Pain on palpation at the keratotic lesion(s) noted. Range of motion within normal limits bilateral. Muscle strength 5/5 in all groups bilateral.  Assessment: 1.  Symptomatic benign skin lesion 2.  Symptomatic fungal nail infections right foot  Plan of Care:  -Patient evaluated -Excisional debridement of keratoic lesion(s) using a chisel blade was performed without incident.  -Salicylic acid applied with a bandaid - In regards to the toenail fungus today we discussed different treatment options and modalities including oral, topical, and laser antifungal treatment modalities.  Efficacies and relative side effects of each modality were explained.  After discussion with the patient she would like to pursue the oral antifungal treatment since it seems to be the most effective.  She has blood work with her PCP next week.  She will reach out to me if she would like a prescription for the Lamisil after blood work to ensure healthy liver function -Return to clinic as needed  Felecia Shelling, DPM [facility]  Dr. Felecia Shelling, DPM    [address]  [place], [place] [address]                Office [phone]  Fax [phone]

## 2023-05-11 LAB — CUP PACEART REMOTE DEVICE CHECK
Date Time Interrogation Session: 20241126110047
Implantable Pulse Generator Implant Date: 20211217

## 2023-05-16 DIAGNOSIS — D1801 Hemangioma of skin and subcutaneous tissue: Secondary | ICD-10-CM | POA: Diagnosis not present

## 2023-05-16 DIAGNOSIS — D692 Other nonthrombocytopenic purpura: Secondary | ICD-10-CM | POA: Diagnosis not present

## 2023-05-16 DIAGNOSIS — I788 Other diseases of capillaries: Secondary | ICD-10-CM | POA: Diagnosis not present

## 2023-05-16 DIAGNOSIS — Z85828 Personal history of other malignant neoplasm of skin: Secondary | ICD-10-CM | POA: Diagnosis not present

## 2023-05-16 DIAGNOSIS — L821 Other seborrheic keratosis: Secondary | ICD-10-CM | POA: Diagnosis not present

## 2023-05-17 DIAGNOSIS — R3989 Other symptoms and signs involving the genitourinary system: Secondary | ICD-10-CM | POA: Diagnosis not present

## 2023-05-17 DIAGNOSIS — R300 Dysuria: Secondary | ICD-10-CM | POA: Diagnosis not present

## 2023-05-17 DIAGNOSIS — N952 Postmenopausal atrophic vaginitis: Secondary | ICD-10-CM | POA: Diagnosis not present

## 2023-05-19 DIAGNOSIS — R600 Localized edema: Secondary | ICD-10-CM | POA: Diagnosis not present

## 2023-05-19 DIAGNOSIS — Z8673 Personal history of transient ischemic attack (TIA), and cerebral infarction without residual deficits: Secondary | ICD-10-CM | POA: Diagnosis not present

## 2023-05-19 DIAGNOSIS — M7989 Other specified soft tissue disorders: Secondary | ICD-10-CM | POA: Diagnosis not present

## 2023-05-19 DIAGNOSIS — E785 Hyperlipidemia, unspecified: Secondary | ICD-10-CM | POA: Diagnosis not present

## 2023-05-20 LAB — COMPREHENSIVE METABOLIC PANEL
ALT: 30 [IU]/L (ref 0–32)
AST: 30 [IU]/L (ref 0–40)
Albumin: 4.2 g/dL (ref 3.7–4.7)
Alkaline Phosphatase: 97 [IU]/L (ref 44–121)
BUN/Creatinine Ratio: 16 (ref 12–28)
BUN: 14 mg/dL (ref 8–27)
Bilirubin Total: 0.5 mg/dL (ref 0.0–1.2)
CO2: 28 mmol/L (ref 20–29)
Calcium: 9.7 mg/dL (ref 8.7–10.3)
Chloride: 101 mmol/L (ref 96–106)
Creatinine, Ser: 0.86 mg/dL (ref 0.57–1.00)
Globulin, Total: 2.7 g/dL (ref 1.5–4.5)
Glucose: 107 mg/dL — ABNORMAL HIGH (ref 70–99)
Potassium: 4.4 mmol/L (ref 3.5–5.2)
Sodium: 141 mmol/L (ref 134–144)
Total Protein: 6.9 g/dL (ref 6.0–8.5)
eGFR: 67 mL/min/{1.73_m2} (ref >59)

## 2023-05-20 LAB — LIPID PANEL
Chol/HDL Ratio: 2.1 {ratio} (ref 0.0–4.4)
Cholesterol, Total: 187 mg/dL (ref 100–199)
HDL: 87 mg/dL (ref >39)
LDL Chol Calc (NIH): 82 mg/dL (ref 0–99)
Triglycerides: 105 mg/dL (ref 0–149)
VLDL Cholesterol Cal: 18 mg/dL (ref 5–40)

## 2023-05-20 LAB — PRO B NATRIURETIC PEPTIDE: NT-Pro BNP: 220 pg/mL (ref 0–738)

## 2023-05-23 ENCOUNTER — Telehealth: Payer: Self-pay

## 2023-05-23 DIAGNOSIS — E785 Hyperlipidemia, unspecified: Secondary | ICD-10-CM

## 2023-05-23 NOTE — Telephone Encounter (Signed)
-----   Message from [name] sent at 05/21/2023 10:19 AM EST ----- Testing Results WNL save for LDL given stroke history Increase to atorvastatin 80 mg; labs in 3 months

## 2023-05-23 NOTE — Telephone Encounter (Signed)
The patient has been notified of the result and verbalized understanding.  All questions (if any) were answered. [name] Pavey N Faust Thorington, RN 05/23/2023 10:40 AM    Pt expresses was told for years that HLD (87) was so good that it cancels out LDL (82).  Want to know if medication increase is necessary.

## 2023-05-23 NOTE — Telephone Encounter (Signed)
Left a message to call back.

## 2023-05-24 MED ORDER — ATORVASTATIN CALCIUM 80 MG PO TABS: 80.0000 mg | ORAL_TABLET | Freq: Every day | ORAL | 3 refills | Status: DC

## 2023-05-24 NOTE — Telephone Encounter (Signed)
Called pt advised of MD response to concern.  Pt is agreeable to increase atorvastatin to 80 mg PO every day.  Pt will call PCP [facility] to see if can have f/u FLP and Liver function at their office in March. I have placed orders and released for [facility] draw if needed.

## 2023-05-26 DIAGNOSIS — H35373 Puckering of macula, bilateral: Secondary | ICD-10-CM | POA: Diagnosis not present

## 2023-05-26 DIAGNOSIS — D3131 Benign neoplasm of right choroid: Secondary | ICD-10-CM | POA: Diagnosis not present

## 2023-05-26 DIAGNOSIS — H35412 Lattice degeneration of retina, left eye: Secondary | ICD-10-CM | POA: Diagnosis not present

## 2023-05-26 DIAGNOSIS — H43813 Vitreous degeneration, bilateral: Secondary | ICD-10-CM | POA: Diagnosis not present

## 2023-06-02 NOTE — Addendum Note (Signed)
Addended by: Geralyn Flash D on: 06/02/2023 09:33 AM   Modules accepted: Orders

## 2023-06-02 NOTE — Progress Notes (Signed)
[facility] Summary Report / Loop Recorder 

## 2023-06-06 DIAGNOSIS — E785 Hyperlipidemia, unspecified: Secondary | ICD-10-CM | POA: Diagnosis not present

## 2023-06-07 LAB — LIPID PANEL
Chol/HDL Ratio: 2.2 {ratio} (ref 0.0–4.4)
Cholesterol, Total: 153 mg/dL (ref 100–199)
HDL: 71 mg/dL (ref >39)
LDL Chol Calc (NIH): 62 mg/dL (ref 0–99)
Triglycerides: 112 mg/dL (ref 0–149)
VLDL Cholesterol Cal: 20 mg/dL (ref 5–40)

## 2023-06-07 LAB — ALT: ALT: 25 [IU]/L (ref 0–32)

## 2023-06-09 DIAGNOSIS — J320 Chronic maxillary sinusitis: Secondary | ICD-10-CM | POA: Diagnosis not present

## 2023-06-09 DIAGNOSIS — Z6824 Body mass index (BMI) 24.0-24.9, adult: Secondary | ICD-10-CM | POA: Diagnosis not present

## 2023-06-09 DIAGNOSIS — Z03818 Encounter for observation for suspected exposure to other biological agents ruled out: Secondary | ICD-10-CM | POA: Diagnosis not present

## 2023-06-09 DIAGNOSIS — R059 Cough, unspecified: Secondary | ICD-10-CM | POA: Diagnosis not present

## 2023-06-13 ENCOUNTER — Ambulatory Visit (INDEPENDENT_AMBULATORY_CARE_PROVIDER_SITE_OTHER): Payer: Medicare HMO

## 2023-06-13 ENCOUNTER — Ambulatory Visit
Admission: RE | Admit: 2023-06-13 | Discharge: 2023-06-13 | Disposition: A | Discharge status: Home / Self Care | Payer: No Typology Code available for payment source | Source: Ambulatory Visit | Attending: Family Medicine | Admitting: Family Medicine

## 2023-06-13 DIAGNOSIS — I63031 Cerebral infarction due to thrombosis of right carotid artery: Secondary | ICD-10-CM | POA: Diagnosis not present

## 2023-06-13 DIAGNOSIS — Z1231 Encounter for screening mammogram for malignant neoplasm of breast: Secondary | ICD-10-CM

## 2023-06-13 LAB — CUP PACEART REMOTE DEVICE CHECK
Date Time Interrogation Session: 20241229230622
Implantable Pulse Generator Implant Date: 20211217

## 2023-06-22 ENCOUNTER — Ambulatory Visit (HOSPITAL_COMMUNITY): Payer: No Typology Code available for payment source | Attending: Cardiology

## 2023-06-22 DIAGNOSIS — Z8673 Personal history of transient ischemic attack (TIA), and cerebral infarction without residual deficits: Secondary | ICD-10-CM | POA: Diagnosis not present

## 2023-06-22 DIAGNOSIS — I10 Essential (primary) hypertension: Secondary | ICD-10-CM | POA: Diagnosis not present

## 2023-06-22 DIAGNOSIS — I340 Nonrheumatic mitral (valve) insufficiency: Secondary | ICD-10-CM | POA: Insufficient documentation

## 2023-06-22 DIAGNOSIS — M7989 Other specified soft tissue disorders: Secondary | ICD-10-CM | POA: Insufficient documentation

## 2023-06-22 DIAGNOSIS — R609 Edema, unspecified: Secondary | ICD-10-CM | POA: Diagnosis not present

## 2023-06-22 DIAGNOSIS — E785 Hyperlipidemia, unspecified: Secondary | ICD-10-CM | POA: Diagnosis not present

## 2023-06-22 LAB — ECHOCARDIOGRAM COMPLETE
Area-P 1/2: 2.47 cm2
S' Lateral: 2.4 cm

## 2023-06-28 DIAGNOSIS — M170 Bilateral primary osteoarthritis of knee: Secondary | ICD-10-CM | POA: Diagnosis not present

## 2023-07-18 ENCOUNTER — Ambulatory Visit (INDEPENDENT_AMBULATORY_CARE_PROVIDER_SITE_OTHER): Payer: Medicare HMO

## 2023-07-18 DIAGNOSIS — I63031 Cerebral infarction due to thrombosis of right carotid artery: Secondary | ICD-10-CM

## 2023-07-18 LAB — CUP PACEART REMOTE DEVICE CHECK
Date Time Interrogation Session: 20250202230307
Implantable Pulse Generator Implant Date: 20211217

## 2023-07-22 ENCOUNTER — Encounter: Payer: Self-pay | Admitting: Cardiology

## 2023-07-25 DIAGNOSIS — M1711 Unilateral primary osteoarthritis, right knee: Secondary | ICD-10-CM | POA: Diagnosis not present

## 2023-07-25 DIAGNOSIS — M25561 Pain in right knee: Secondary | ICD-10-CM | POA: Diagnosis not present

## 2023-07-25 DIAGNOSIS — M7062 Trochanteric bursitis, left hip: Secondary | ICD-10-CM | POA: Diagnosis not present

## 2023-07-25 DIAGNOSIS — M25469 Effusion, unspecified knee: Secondary | ICD-10-CM | POA: Diagnosis not present

## 2023-07-25 DIAGNOSIS — M170 Bilateral primary osteoarthritis of knee: Secondary | ICD-10-CM | POA: Diagnosis not present

## 2023-07-26 DIAGNOSIS — M1711 Unilateral primary osteoarthritis, right knee: Secondary | ICD-10-CM | POA: Diagnosis not present

## 2023-07-29 ENCOUNTER — Emergency Department (HOSPITAL_COMMUNITY)
Admission: EM | Admit: 2023-07-29 | Discharge: 2023-07-29 | Disposition: A | Discharge status: Home / Self Care | Payer: No Typology Code available for payment source | Attending: Emergency Medicine | Admitting: Emergency Medicine

## 2023-07-29 ENCOUNTER — Encounter (HOSPITAL_COMMUNITY): Payer: Self-pay

## 2023-07-29 ENCOUNTER — Other Ambulatory Visit: Payer: Self-pay

## 2023-07-29 DIAGNOSIS — Z85528 Personal history of other malignant neoplasm of kidney: Secondary | ICD-10-CM | POA: Diagnosis not present

## 2023-07-29 DIAGNOSIS — Z7982 Long term (current) use of aspirin: Secondary | ICD-10-CM | POA: Diagnosis not present

## 2023-07-29 DIAGNOSIS — R600 Localized edema: Secondary | ICD-10-CM | POA: Insufficient documentation

## 2023-07-29 DIAGNOSIS — M25561 Pain in right knee: Secondary | ICD-10-CM | POA: Diagnosis not present

## 2023-07-29 DIAGNOSIS — R2241 Localized swelling, mass and lump, right lower limb: Secondary | ICD-10-CM | POA: Diagnosis not present

## 2023-07-29 DIAGNOSIS — M7989 Other specified soft tissue disorders: Secondary | ICD-10-CM | POA: Diagnosis not present

## 2023-07-29 DIAGNOSIS — M1711 Unilateral primary osteoarthritis, right knee: Secondary | ICD-10-CM | POA: Diagnosis not present

## 2023-07-29 MED ORDER — ENOXAPARIN SODIUM 80 MG/0.8ML IJ SOSY
1.0000 mg/kg | PREFILLED_SYRINGE | Freq: Once | INTRAMUSCULAR | Status: AC
  Administered 2023-07-29: 65 mg via SUBCUTANEOUS
  Filled 2023-07-29: qty 0.65

## 2023-07-29 NOTE — ED Triage Notes (Signed)
Pt reports with right knee pain and swelling that is now going down her right leg and ankle since this weekend. Pt had fluid removed from her knee on Monday that was brown in color.

## 2023-07-29 NOTE — Discharge Instructions (Addendum)
Follow-up tomorrow at [facility] as per the instructions.

## 2023-07-29 NOTE — ED Provider Notes (Signed)
[place] EMERGENCY DEPARTMENT AT [facility] Provider Note   CSN: [id number] Arrival date & time: 07/29/23  1810     History  Chief Complaint  Patient presents with   Knee Pain    [name] is a 83 y.o. female.   Knee Pain Patient presents with swelling of the right lower leg.  Has had right knee pain has been seen by [facility] for.  Had arthrocentesis done.  No definite infection seen.  Had more swelling.  Sent in for Doppler exam.  No chest pain or trouble breathing.  Has had unilateral kidney but no other swelling.    Past Medical History:  Diagnosis Date   Blood transfusion without reported diagnosis    with hip fracture-83 years old   Cancer [facility]) 1983   Kidney Cancer   Diverticulosis    GERD (gastroesophageal reflux disease)    Personal history of kidney cancer     Home Medications Prior to Admission medications   Medication Sig Start Date End Date Taking? Authorizing Provider  acetaminophen (TYLENOL) 650 MG CR tablet Take 650 mg by mouth daily.    [provider]  aspirin EC 81 MG EC tablet Take 1 tablet (81 mg total) by mouth daily. Swallow whole. 06/01/20   Rolly Salter, MD  atorvastatin (LIPITOR) 80 MG tablet Take 1 tablet (80 mg total) by mouth daily. 05/24/23   Christell Constant, MD  clopidogrel (PLAVIX) 75 MG tablet Take 75 mg by mouth daily.    [provider]  cyclobenzaprine (FLEXERIL) 5 MG tablet TAKE ONE TABLET BY MOUTH THREE TIMES A DAY AS NEEDED FOR MUSCLE SPASMS 07/21/21   Kirsteins, Victorino Sparrow, MD  famotidine (PEPCID) 20 MG tablet Take 20 mg by mouth 2 (two) times daily.    [provider]  furosemide (LASIX) 20 MG tablet Take 1 tablet (20 mg total) by mouth every other day. 05/03/23   Chandrasekhar, Lafayette Dragon A, MD  MAGNESIUM PO Take 400 mg by mouth every evening.    [provider]  meclizine (ANTIVERT) 25 MG tablet as needed for dizziness.    [provider]  Multiple  Vitamins-Minerals (MULTIVITAMIN WITH MINERALS) tablet Take 1 tablet by mouth daily.    [provider]  pantoprazole (PROTONIX) 40 MG tablet Take 1 tablet (40 mg total) by mouth daily. 06/05/20   Love, Evlyn Kanner, PA-C  PARoxetine (PAXIL) 10 MG tablet Take 10 mg by mouth daily.    [provider]  polyethylene glycol (MIRALAX / GLYCOLAX) 17 g packet Take 17 g by mouth daily. 06/05/20   Love, Evlyn Kanner, PA-C  senna-docusate (SENOKOT-S) 8.6-50 MG tablet Take 1 tablet by mouth 2 (two) times daily. 05/31/20   Rolly Salter, MD  vitamin C (ASCORBIC ACID) 500 MG tablet Take 500 mg by mouth daily.    [provider]      Allergies    Codeine    Review of Systems   Review of Systems  Physical Exam Updated Vital Signs BP (!) 164/53   Pulse 64   Temp 97.8 F (36.6 C) (Oral)   Resp 16   Ht 5\' 4"  (1.626 m)   Wt 64.9 kg   SpO2 99%   BMI 24.56 kg/m  Physical Exam Vitals and nursing note reviewed.  Abdominal:     Tenderness: There is no abdominal tenderness.  Musculoskeletal:     Comments: Some tenderness to right knee.  Edema of right lower extremity  Neurological:  Mental Status: She is alert and oriented to person, place, and time.     ED Results / Procedures / Treatments   Labs (all labs ordered are listed, but only abnormal results are displayed) Labs Reviewed - No data to display  EKG None  Radiology No results found.  Procedures Procedures    Medications Ordered in ED Medications  enoxaparin (LOVENOX) injection 65 mg (65 mg Subcutaneous Given 07/29/23 2015)    ED Course/ Medical Decision Making/ A&P                                 Medical Decision Making Risk Prescription drug management.   Patient with edema right lower leg.  Differential diagnosis includes peripheral edema, reactive edema from the knee also DVT.  Doppler not available at this time.  Give shot of Lovenox.  Reviewed previous kidney function has been normal as of a  couple months ago.  With only unilateral swelling do not think we need to recheck.  Discharged home with outpatient Doppler.  Doubt pulmonary embolism.        Final Clinical Impression(s) / ED Diagnoses Final diagnoses:  Peripheral edema    Rx / DC Orders ED Discharge Orders          Ordered    LE VENOUS        07/29/23 1953              Benjiman Core, MD 07/29/23 2324

## 2023-07-30 ENCOUNTER — Telehealth: Payer: Self-pay | Admitting: Physician Assistant

## 2023-07-30 ENCOUNTER — Ambulatory Visit (HOSPITAL_COMMUNITY)
Admission: RE | Admit: 2023-07-30 | Discharge: 2023-07-30 | Disposition: A | Discharge status: Home / Self Care | Payer: No Typology Code available for payment source | Source: Ambulatory Visit | Attending: Emergency Medicine | Admitting: Emergency Medicine

## 2023-07-30 DIAGNOSIS — M7989 Other specified soft tissue disorders: Secondary | ICD-10-CM | POA: Insufficient documentation

## 2023-07-30 NOTE — Telephone Encounter (Signed)
Patient's daughter called because they had been told that her loop recorder would need to be put in MRI mode before she could have an MRI of her knee which she definitely needs.  She has a [employer] loop recorder in place.  Spoke with Dr. Elberta Fortis who states that she can just have the MRI and it does not need to be put in MRI mode first.  It should not damage or harm loop recorder at all.  Called Ms. Maple back and let her and Ms. Ketner know this, they were relieved to hear it.  Theodore Demark, PA-C 07/30/2023 1:12 PM

## 2023-07-31 DIAGNOSIS — M1711 Unilateral primary osteoarthritis, right knee: Secondary | ICD-10-CM | POA: Diagnosis not present

## 2023-08-01 DIAGNOSIS — H35033 Hypertensive retinopathy, bilateral: Secondary | ICD-10-CM | POA: Diagnosis not present

## 2023-08-01 DIAGNOSIS — D3132 Benign neoplasm of left choroid: Secondary | ICD-10-CM | POA: Diagnosis not present

## 2023-08-01 DIAGNOSIS — H35373 Puckering of macula, bilateral: Secondary | ICD-10-CM | POA: Diagnosis not present

## 2023-08-01 DIAGNOSIS — D3131 Benign neoplasm of right choroid: Secondary | ICD-10-CM | POA: Diagnosis not present

## 2023-08-01 DIAGNOSIS — H04123 Dry eye syndrome of bilateral lacrimal glands: Secondary | ICD-10-CM | POA: Diagnosis not present

## 2023-08-08 DIAGNOSIS — M1711 Unilateral primary osteoarthritis, right knee: Secondary | ICD-10-CM | POA: Diagnosis not present

## 2023-08-09 ENCOUNTER — Telehealth: Payer: Self-pay | Admitting: Internal Medicine

## 2023-08-09 DIAGNOSIS — E785 Hyperlipidemia, unspecified: Secondary | ICD-10-CM

## 2023-08-09 NOTE — Telephone Encounter (Signed)
 Left a message to call back.

## 2023-08-09 NOTE — Telephone Encounter (Signed)
 Pt called in about lab orders. She states she was supposed to have labs orders drawn in 3 months. She would like to have them drawn with her PCP and have them sent here, she asked what labs did she need drawn. Please advise.

## 2023-08-10 ENCOUNTER — Ambulatory Visit: Payer: No Typology Code available for payment source | Admitting: Physician Assistant

## 2023-08-11 DIAGNOSIS — M25512 Pain in left shoulder: Secondary | ICD-10-CM | POA: Diagnosis not present

## 2023-08-11 DIAGNOSIS — M19111 Post-traumatic osteoarthritis, right shoulder: Secondary | ICD-10-CM | POA: Diagnosis not present

## 2023-08-11 DIAGNOSIS — M25511 Pain in right shoulder: Secondary | ICD-10-CM | POA: Diagnosis not present

## 2023-08-11 NOTE — Telephone Encounter (Signed)
 Called pt who reports would like to have labs drawn at PCP office.  Reports PCP office uses [facility].  I have released FLP, ALT orders and released for draw.  Pt thanked me for call no further concerns at this time.

## 2023-08-11 NOTE — Telephone Encounter (Signed)
 Left a message to call back.

## 2023-08-15 DIAGNOSIS — E785 Hyperlipidemia, unspecified: Secondary | ICD-10-CM | POA: Diagnosis not present

## 2023-08-15 LAB — LIPID PANEL
Chol/HDL Ratio: 1.8 ratio (ref 0.0–4.4)
Cholesterol, Total: 154 mg/dL (ref 100–199)
HDL: 84 mg/dL (ref >39)
LDL Chol Calc (NIH): 55 mg/dL (ref 0–99)
Triglycerides: 83 mg/dL (ref 0–149)
VLDL Cholesterol Cal: 15 mg/dL (ref 5–40)

## 2023-08-15 LAB — ALT: ALT: 18 IU/L (ref 0–32)

## 2023-08-16 ENCOUNTER — Encounter: Payer: Self-pay | Admitting: Internal Medicine

## 2023-08-22 ENCOUNTER — Ambulatory Visit (INDEPENDENT_AMBULATORY_CARE_PROVIDER_SITE_OTHER): Payer: Medicare HMO

## 2023-08-22 DIAGNOSIS — I63031 Cerebral infarction due to thrombosis of right carotid artery: Secondary | ICD-10-CM | POA: Diagnosis not present

## 2023-08-23 NOTE — Progress Notes (Signed)
 Carelink Summary Report / Loop Recorder

## 2023-08-24 DIAGNOSIS — M25561 Pain in right knee: Secondary | ICD-10-CM | POA: Diagnosis not present

## 2023-08-24 DIAGNOSIS — M419 Scoliosis, unspecified: Secondary | ICD-10-CM | POA: Diagnosis not present

## 2023-08-24 DIAGNOSIS — M539 Dorsopathy, unspecified: Secondary | ICD-10-CM | POA: Diagnosis not present

## 2023-08-24 DIAGNOSIS — M545 Low back pain, unspecified: Secondary | ICD-10-CM | POA: Diagnosis not present

## 2023-08-24 DIAGNOSIS — E785 Hyperlipidemia, unspecified: Secondary | ICD-10-CM | POA: Diagnosis not present

## 2023-08-24 DIAGNOSIS — M549 Dorsalgia, unspecified: Secondary | ICD-10-CM | POA: Diagnosis not present

## 2023-08-24 DIAGNOSIS — I700 Atherosclerosis of aorta: Secondary | ICD-10-CM | POA: Diagnosis not present

## 2023-08-24 LAB — CUP PACEART REMOTE DEVICE CHECK
Date Time Interrogation Session: 20250309230429
Implantable Pulse Generator Implant Date: 20211217

## 2023-08-25 ENCOUNTER — Encounter: Payer: Self-pay | Admitting: Cardiology

## 2023-08-25 DIAGNOSIS — N183 Chronic kidney disease, stage 3 unspecified: Secondary | ICD-10-CM | POA: Diagnosis not present

## 2023-08-25 DIAGNOSIS — E1122 Type 2 diabetes mellitus with diabetic chronic kidney disease: Secondary | ICD-10-CM | POA: Diagnosis not present

## 2023-08-25 DIAGNOSIS — E785 Hyperlipidemia, unspecified: Secondary | ICD-10-CM | POA: Diagnosis not present

## 2023-08-30 DIAGNOSIS — M25561 Pain in right knee: Secondary | ICD-10-CM | POA: Diagnosis not present

## 2023-08-30 DIAGNOSIS — S83241S Other tear of medial meniscus, current injury, right knee, sequela: Secondary | ICD-10-CM | POA: Diagnosis not present

## 2023-08-31 ENCOUNTER — Telehealth: Payer: Self-pay

## 2023-08-31 NOTE — Telephone Encounter (Signed)
   Pre-operative Risk Assessment    Patient Name: [name]  DOB: [birth year] MRN: [id number]   Date of last office visit: 05/03/23 [facility], MD Date of next office visit: 10/18/23 Tereso Newcomer, PA   Request for Surgical Clearance    Procedure:   RIGHT KNWW SCOPE, PM AND PLM  Date of Surgery:  Clearance TBD                                Surgeon:  DR Duwayne Heck Surgeon's Group or Practice Name:  [facility] Phone number:  [phone] Fax number:  [phone] ATTN: KERRI MAZE   Type of Clearance Requested:   - Medical  - Pharmacy:  Hold Aspirin and Clopidogrel (Plavix)     Type of Anesthesia:   CHOICE   Additional requests/questions:    [name]   08/31/2023, 10:03 AM

## 2023-08-31 NOTE — Telephone Encounter (Signed)
 Left message to call back to schedule tele pre op appt.

## 2023-08-31 NOTE — Telephone Encounter (Signed)
   Name: [name]  DOB: 07-02-1940  MRN: [id number]  Primary Cardiologist: Christell Constant, MD   Preoperative team, please contact this patient and set up a phone call appointment for further preoperative risk assessment. Please obtain consent and complete medication review. Thank you for your help.  I confirm that guidance regarding antiplatelet and oral anticoagulation therapy has been completed and, if necessary, noted below.  Patient's aspirin and Plavix are prescribed for history of CVA.  Recommendations for holding aspirin and Plavix will need to come from neurology.  I also confirmed the patient resides in the state of [place]. As per [facility] Medical Board telemedicine laws, the patient must reside in the state in which the provider is licensed.   Ronney Asters, NP 08/31/2023, 10:13 AM [facility]

## 2023-09-02 ENCOUNTER — Telehealth: Payer: Self-pay

## 2023-09-02 NOTE — Telephone Encounter (Signed)
 Called patient to schedule a Tele visit for a pre-op clearance on 09/23/23 @1 : 20, meds rec and consent.     Patient Consent for Virtual Visit        [name] has provided verbal consent on 09/02/2023 for a virtual visit (video or telephone).   CONSENT FOR VIRTUAL VISIT FOR:  [name]  By participating in this virtual visit I agree to the following:  I hereby voluntarily request, consent and authorize [facility] and its employed or contracted physicians, physician assistants, nurse practitioners or other licensed health care professionals (the Practitioner), to provide me with telemedicine health care services (the "Services") as deemed necessary by the treating Practitioner. I acknowledge and consent to receive the Services by the Practitioner via telemedicine. I understand that the telemedicine visit will involve communicating with the Practitioner through live audiovisual communication technology and the disclosure of certain medical information by electronic transmission. I acknowledge that I have been given the opportunity to request an in-person assessment or other available alternative prior to the telemedicine visit and am voluntarily participating in the telemedicine visit.  I understand that I have the right to withhold or withdraw my consent to the use of telemedicine in the course of my care at any time, without affecting my right to future care or treatment, and that the Practitioner or I may terminate the telemedicine visit at any time. I understand that I have the right to inspect all information obtained and/or recorded in the course of the telemedicine visit and may receive copies of available information for a reasonable fee.  I understand that some of the potential risks of receiving the Services via telemedicine include:  Delay or interruption in medical evaluation due to technological equipment failure or disruption; Information transmitted may not be  sufficient (e.g. poor resolution of images) to allow for appropriate medical decision making by the Practitioner; and/or  In rare instances, security protocols could fail, causing a breach of personal health information.  Furthermore, I acknowledge that it is my responsibility to provide information about my medical history, conditions and care that is complete and accurate to the best of my ability. I acknowledge that Practitioner's advice, recommendations, and/or decision may be based on factors not within their control, such as incomplete or inaccurate data provided by me or distortions of diagnostic images or specimens that may result from electronic transmissions. I understand that the practice of medicine is not an exact science and that Practitioner makes no warranties or guarantees regarding treatment outcomes. I acknowledge that a copy of this consent can be made available to me via my patient portal [facility] MyChart), or I can request a printed copy by calling the office of [facility].    I understand that my insurance will be billed for this visit.   I have read or had this consent read to me. I understand the contents of this consent, which adequately explains the benefits and risks of the Services being provided via telemedicine.  I have been provided ample opportunity to ask questions regarding this consent and the Services and have had my questions answered to my satisfaction. I give my informed consent for the services to be provided through the use of telemedicine in my medical care

## 2023-09-02 NOTE — Telephone Encounter (Signed)
 Called patient to schedule a Tele visit for a pre-op clearance on 09/23/23 @1 : 20, meds rec and consent.

## 2023-09-15 DIAGNOSIS — H35373 Puckering of macula, bilateral: Secondary | ICD-10-CM | POA: Diagnosis not present

## 2023-09-15 DIAGNOSIS — D3131 Benign neoplasm of right choroid: Secondary | ICD-10-CM | POA: Diagnosis not present

## 2023-09-15 DIAGNOSIS — H04123 Dry eye syndrome of bilateral lacrimal glands: Secondary | ICD-10-CM | POA: Diagnosis not present

## 2023-09-15 DIAGNOSIS — D3132 Benign neoplasm of left choroid: Secondary | ICD-10-CM | POA: Diagnosis not present

## 2023-09-21 ENCOUNTER — Ambulatory Visit: Payer: No Typology Code available for payment source | Admitting: Physician Assistant

## 2023-09-21 NOTE — Progress Notes (Signed)
 Carelink Summary Report / Loop Recorder

## 2023-09-21 NOTE — Addendum Note (Signed)
 Addended by: Geralyn Flash D on: 09/21/2023 03:40 PM   Modules accepted: Orders

## 2023-09-23 ENCOUNTER — Ambulatory Visit: Attending: Cardiology

## 2023-09-23 DIAGNOSIS — Z01810 Encounter for preprocedural cardiovascular examination: Secondary | ICD-10-CM

## 2023-09-23 NOTE — Progress Notes (Signed)
 Virtual Visit via Telephone Note   Because of [name] co-morbid illnesses, she is at least at moderate risk for complications without adequate follow up.  This format is felt to be most appropriate for this patient at this time.  Due to technical limitations with video connection (technology), today's appointment will be conducted as an audio only telehealth visit, and [name] verbally agreed to proceed in this manner.   All issues noted in this document were discussed and addressed.  No physical exam could be performed with this format.  Evaluation Performed:  Preoperative cardiovascular risk assessment _____________   Date:  09/23/2023   Patient ID:  [name], [name] 02-Jan-1941, MRN [id number] Patient Location:  Home Provider location:   Office  Primary Care Provider:  Soundra Pilon, FNP Primary Cardiologist:  Christell Constant, MD  Chief Complaint / Patient Profile   83 y.o. y/o female with a h/o CVA, kidney CA, GERD, HLD who is pending right knee scope and presents today for telephonic preoperative cardiovascular risk assessment.  History of Present Illness    [name] is a 83 y.o. female who presents via audio/video conferencing for a telehealth visit today.  Pt was last seen in cardiology clinic on 05/03/2023 by Dr. Raynelle Jan. At that time [name] was doing well but was experiencing some lower extremity swelling and was started on Lasix.  Patient's blood pressure was also noted to be elevated with plan to monitor.  The patient is now pending procedure as outlined above. Since her last visit, she has been doing well with no new cardiac complaints.  She is still active with her ADLs but is limited with her right knee pain.  She is compliant with her Lasix and reports that her swelling is under better control but does not check her blood pressures at home.  She reports that she no longer takes Plavix but does take a aspirin daily.  I advised her  to reach out to her neurologist for guidance on holding aspirin prior to her upcoming procedure.  She denies chest pain, shortness of breath, lower extremity edema, fatigue, palpitations, melena, hematuria, hemoptysis, diaphoresis, weakness, presyncope, syncope, orthopnea, and PND.     Past Medical History    Past Medical History:  Diagnosis Date   Blood transfusion without reported diagnosis    with hip fracture-83 years old   Cancer [facility]) 1983   Kidney Cancer   Diverticulosis    GERD (gastroesophageal reflux disease)    Personal history of kidney cancer    Past Surgical History:  Procedure Laterality Date   ABDOMINAL HYSTERECTOMY     COLON SURGERY     Severe sigmoid diverticulitis   COLONOSCOPY     FOOT SURGERY     HIP SURGERY     Kidney cancer     Kidney removed   LOOP RECORDER INSERTION N/A 05/30/2020   Procedure: LOOP RECORDER INSERTION;  Surgeon: Lanier Prude, MD;  Location: [facility] INVASIVE CV LAB;  Service: Cardiovascular;  Laterality: N/A;   POLYPECTOMY      Allergies  Allergies  Allergen Reactions   Codeine Other (See Comments)    Pt unsure if really allergic to this    Home Medications    Prior to Admission medications   Medication Sig Start Date End Date Taking? Authorizing Provider  acetaminophen (TYLENOL) 650 MG CR tablet Take 650 mg by mouth daily.    [provider]  aspirin EC 81 MG  EC tablet Take 1 tablet (81 mg total) by mouth daily. Swallow whole. 06/01/20   Rolly Salter, MD  atorvastatin (LIPITOR) 80 MG tablet Take 1 tablet (80 mg total) by mouth daily. 05/24/23   Christell Constant, MD  clopidogrel (PLAVIX) 75 MG tablet Take 75 mg by mouth daily.    [provider]  cyclobenzaprine (FLEXERIL) 5 MG tablet TAKE ONE TABLET BY MOUTH THREE TIMES A DAY AS NEEDED FOR MUSCLE SPASMS 07/21/21   Kirsteins, Victorino Sparrow, MD  famotidine (PEPCID) 20 MG tablet Take 20 mg by mouth 2 (two) times daily.    [provider]   furosemide (LASIX) 20 MG tablet Take 1 tablet (20 mg total) by mouth every other day. 05/03/23   Chandrasekhar, Lafayette Dragon A, MD  MAGNESIUM PO Take 400 mg by mouth every evening.    [provider]  meclizine (ANTIVERT) 25 MG tablet as needed for dizziness.    [provider]  Multiple Vitamins-Minerals (MULTIVITAMIN WITH MINERALS) tablet Take 1 tablet by mouth daily.    [provider]  pantoprazole (PROTONIX) 40 MG tablet Take 1 tablet (40 mg total) by mouth daily. 06/05/20   Love, Evlyn Kanner, PA-C  PARoxetine (PAXIL) 10 MG tablet Take 10 mg by mouth daily.    [provider]  polyethylene glycol (MIRALAX / GLYCOLAX) 17 g packet Take 17 g by mouth daily. 06/05/20   Love, Evlyn Kanner, PA-C  senna-docusate (SENOKOT-S) 8.6-50 MG tablet Take 1 tablet by mouth 2 (two) times daily. 05/31/20   Rolly Salter, MD  vitamin C (ASCORBIC ACID) 500 MG tablet Take 500 mg by mouth daily.    [provider]    Physical Exam    Vital Signs:  [name] does not have vital signs available for review today.  Given telephonic nature of communication, physical exam is limited. AAOx3. NAD. Normal affect.  Speech and respirations are unlabored.  Accessory Clinical Findings    None  Assessment & Plan    1.  Preoperative Cardiovascular Risk Assessment: -Patient's RCRI score 6.6%  The patient affirms she has been doing well without any new cardiac symptoms. They are able to achieve 6 METS without cardiac limitations. Therefore, based on ACC/AHA guidelines, the patient would be at acceptable risk for the planned procedure without further cardiovascular testing. The patient was advised that if she develops new symptoms prior to surgery to contact our office to arrange for a follow-up visit, and she verbalized understanding.   The patient was advised that if she develops new symptoms prior to surgery to contact our office to arrange for a follow-up visit, and she  verbalized understanding.  Patient's aspirin and Plavix are prescribed for history of CVA. Recommendations for holding aspirin and Plavix will need to come from neurology.   A copy of this note will be routed to requesting surgeon.  Time:   Today, I have spent 8 minutes with the patient with telehealth technology discussing medical history, symptoms, and management plan.     Napoleon Form, Leodis Rains, NP  09/23/2023, 6:58 AM

## 2023-09-26 ENCOUNTER — Ambulatory Visit (INDEPENDENT_AMBULATORY_CARE_PROVIDER_SITE_OTHER): Payer: Medicare HMO

## 2023-09-26 DIAGNOSIS — I63031 Cerebral infarction due to thrombosis of right carotid artery: Secondary | ICD-10-CM

## 2023-09-26 NOTE — Telephone Encounter (Signed)
 I will update the surgeon's office that the ASA hold needs to come from neuro or the surgeon as cardiology does not manage ASA.

## 2023-09-26 NOTE — Telephone Encounter (Signed)
 Kerri Maze surgery scheduler called today states she received the clearance notes from Charles Connor, NP 09/23/23. She states he commented about ASA and Plavix clearance from neuro, see ov notes from Charles Connor, NP. I reviewed the ov notes and stated that I see where the pt told NP that she is no longer taking plavix but she is taking ASA 81 mg. NP noted for her to get ok from neuro for ASA hold recommendations.   Maudine Sos states that they are planning surgery for 09/30/23 and asked if our team would be able to provide recommendations for the ASA. Maudine Sos, states even she can hold ASA as of tomorrow, which would give a 3 day hold.   I assured Maudine Sos that I will forward this to the preop APP for review.

## 2023-09-27 ENCOUNTER — Encounter: Payer: Self-pay | Admitting: Cardiology

## 2023-09-27 LAB — CUP PACEART REMOTE DEVICE CHECK
Date Time Interrogation Session: 20250413230449
Implantable Pulse Generator Implant Date: 20211217

## 2023-09-30 DIAGNOSIS — S83271A Complex tear of lateral meniscus, current injury, right knee, initial encounter: Secondary | ICD-10-CM | POA: Diagnosis not present

## 2023-09-30 DIAGNOSIS — S83241A Other tear of medial meniscus, current injury, right knee, initial encounter: Secondary | ICD-10-CM | POA: Diagnosis not present

## 2023-09-30 DIAGNOSIS — S83231A Complex tear of medial meniscus, current injury, right knee, initial encounter: Secondary | ICD-10-CM | POA: Diagnosis not present

## 2023-09-30 DIAGNOSIS — G8918 Other acute postprocedural pain: Secondary | ICD-10-CM | POA: Diagnosis not present

## 2023-09-30 DIAGNOSIS — M65961 Unspecified synovitis and tenosynovitis, right lower leg: Secondary | ICD-10-CM | POA: Diagnosis not present

## 2023-09-30 DIAGNOSIS — M2241 Chondromalacia patellae, right knee: Secondary | ICD-10-CM | POA: Diagnosis not present

## 2023-09-30 DIAGNOSIS — M94261 Chondromalacia, right knee: Secondary | ICD-10-CM | POA: Diagnosis not present

## 2023-10-07 DIAGNOSIS — M419 Scoliosis, unspecified: Secondary | ICD-10-CM | POA: Diagnosis not present

## 2023-10-07 DIAGNOSIS — E785 Hyperlipidemia, unspecified: Secondary | ICD-10-CM | POA: Diagnosis not present

## 2023-10-07 DIAGNOSIS — M545 Low back pain, unspecified: Secondary | ICD-10-CM | POA: Diagnosis not present

## 2023-10-07 DIAGNOSIS — S83271D Complex tear of lateral meniscus, current injury, right knee, subsequent encounter: Secondary | ICD-10-CM | POA: Diagnosis not present

## 2023-10-07 DIAGNOSIS — I700 Atherosclerosis of aorta: Secondary | ICD-10-CM | POA: Diagnosis not present

## 2023-10-07 DIAGNOSIS — M519 Unspecified thoracic, thoracolumbar and lumbosacral intervertebral disc disorder: Secondary | ICD-10-CM | POA: Diagnosis not present

## 2023-10-07 DIAGNOSIS — G8929 Other chronic pain: Secondary | ICD-10-CM | POA: Diagnosis not present

## 2023-10-07 DIAGNOSIS — Z859 Personal history of malignant neoplasm, unspecified: Secondary | ICD-10-CM | POA: Diagnosis not present

## 2023-10-07 DIAGNOSIS — S83241D Other tear of medial meniscus, current injury, right knee, subsequent encounter: Secondary | ICD-10-CM | POA: Diagnosis not present

## 2023-10-07 DIAGNOSIS — Z79891 Long term (current) use of opiate analgesic: Secondary | ICD-10-CM | POA: Diagnosis not present

## 2023-10-07 DIAGNOSIS — M7121 Synovial cyst of popliteal space [Baker], right knee: Secondary | ICD-10-CM | POA: Diagnosis not present

## 2023-10-07 DIAGNOSIS — M150 Primary generalized (osteo)arthritis: Secondary | ICD-10-CM | POA: Diagnosis not present

## 2023-10-07 DIAGNOSIS — Z7982 Long term (current) use of aspirin: Secondary | ICD-10-CM | POA: Diagnosis not present

## 2023-10-11 DIAGNOSIS — E785 Hyperlipidemia, unspecified: Secondary | ICD-10-CM | POA: Diagnosis not present

## 2023-10-11 DIAGNOSIS — M419 Scoliosis, unspecified: Secondary | ICD-10-CM | POA: Diagnosis not present

## 2023-10-11 DIAGNOSIS — I700 Atherosclerosis of aorta: Secondary | ICD-10-CM | POA: Diagnosis not present

## 2023-10-11 DIAGNOSIS — M7121 Synovial cyst of popliteal space [Baker], right knee: Secondary | ICD-10-CM | POA: Diagnosis not present

## 2023-10-11 DIAGNOSIS — G8929 Other chronic pain: Secondary | ICD-10-CM | POA: Diagnosis not present

## 2023-10-11 DIAGNOSIS — Z79891 Long term (current) use of opiate analgesic: Secondary | ICD-10-CM | POA: Diagnosis not present

## 2023-10-11 DIAGNOSIS — S83241D Other tear of medial meniscus, current injury, right knee, subsequent encounter: Secondary | ICD-10-CM | POA: Diagnosis not present

## 2023-10-11 DIAGNOSIS — M150 Primary generalized (osteo)arthritis: Secondary | ICD-10-CM | POA: Diagnosis not present

## 2023-10-11 DIAGNOSIS — Z7982 Long term (current) use of aspirin: Secondary | ICD-10-CM | POA: Diagnosis not present

## 2023-10-11 DIAGNOSIS — Z859 Personal history of malignant neoplasm, unspecified: Secondary | ICD-10-CM | POA: Diagnosis not present

## 2023-10-11 DIAGNOSIS — M519 Unspecified thoracic, thoracolumbar and lumbosacral intervertebral disc disorder: Secondary | ICD-10-CM | POA: Diagnosis not present

## 2023-10-11 DIAGNOSIS — M545 Low back pain, unspecified: Secondary | ICD-10-CM | POA: Diagnosis not present

## 2023-10-11 DIAGNOSIS — S83271D Complex tear of lateral meniscus, current injury, right knee, subsequent encounter: Secondary | ICD-10-CM | POA: Diagnosis not present

## 2023-10-14 DIAGNOSIS — E785 Hyperlipidemia, unspecified: Secondary | ICD-10-CM | POA: Diagnosis not present

## 2023-10-14 DIAGNOSIS — Z7982 Long term (current) use of aspirin: Secondary | ICD-10-CM | POA: Diagnosis not present

## 2023-10-14 DIAGNOSIS — S83241D Other tear of medial meniscus, current injury, right knee, subsequent encounter: Secondary | ICD-10-CM | POA: Diagnosis not present

## 2023-10-14 DIAGNOSIS — Z79891 Long term (current) use of opiate analgesic: Secondary | ICD-10-CM | POA: Diagnosis not present

## 2023-10-14 DIAGNOSIS — M419 Scoliosis, unspecified: Secondary | ICD-10-CM | POA: Diagnosis not present

## 2023-10-14 DIAGNOSIS — Z859 Personal history of malignant neoplasm, unspecified: Secondary | ICD-10-CM | POA: Diagnosis not present

## 2023-10-14 DIAGNOSIS — M545 Low back pain, unspecified: Secondary | ICD-10-CM | POA: Diagnosis not present

## 2023-10-14 DIAGNOSIS — G8929 Other chronic pain: Secondary | ICD-10-CM | POA: Diagnosis not present

## 2023-10-14 DIAGNOSIS — M7121 Synovial cyst of popliteal space [Baker], right knee: Secondary | ICD-10-CM | POA: Diagnosis not present

## 2023-10-14 DIAGNOSIS — M519 Unspecified thoracic, thoracolumbar and lumbosacral intervertebral disc disorder: Secondary | ICD-10-CM | POA: Diagnosis not present

## 2023-10-14 DIAGNOSIS — S83271D Complex tear of lateral meniscus, current injury, right knee, subsequent encounter: Secondary | ICD-10-CM | POA: Diagnosis not present

## 2023-10-14 DIAGNOSIS — I700 Atherosclerosis of aorta: Secondary | ICD-10-CM | POA: Diagnosis not present

## 2023-10-14 DIAGNOSIS — M150 Primary generalized (osteo)arthritis: Secondary | ICD-10-CM | POA: Diagnosis not present

## 2023-10-18 ENCOUNTER — Ambulatory Visit: Admitting: Physician Assistant

## 2023-10-18 DIAGNOSIS — M545 Low back pain, unspecified: Secondary | ICD-10-CM | POA: Diagnosis not present

## 2023-10-18 DIAGNOSIS — Z79891 Long term (current) use of opiate analgesic: Secondary | ICD-10-CM | POA: Diagnosis not present

## 2023-10-18 DIAGNOSIS — Z859 Personal history of malignant neoplasm, unspecified: Secondary | ICD-10-CM | POA: Diagnosis not present

## 2023-10-18 DIAGNOSIS — G8929 Other chronic pain: Secondary | ICD-10-CM | POA: Diagnosis not present

## 2023-10-18 DIAGNOSIS — M519 Unspecified thoracic, thoracolumbar and lumbosacral intervertebral disc disorder: Secondary | ICD-10-CM | POA: Diagnosis not present

## 2023-10-18 DIAGNOSIS — S83271D Complex tear of lateral meniscus, current injury, right knee, subsequent encounter: Secondary | ICD-10-CM | POA: Diagnosis not present

## 2023-10-18 DIAGNOSIS — I700 Atherosclerosis of aorta: Secondary | ICD-10-CM | POA: Diagnosis not present

## 2023-10-18 DIAGNOSIS — M7121 Synovial cyst of popliteal space [Baker], right knee: Secondary | ICD-10-CM | POA: Diagnosis not present

## 2023-10-18 DIAGNOSIS — Z7982 Long term (current) use of aspirin: Secondary | ICD-10-CM | POA: Diagnosis not present

## 2023-10-18 DIAGNOSIS — E785 Hyperlipidemia, unspecified: Secondary | ICD-10-CM | POA: Diagnosis not present

## 2023-10-18 DIAGNOSIS — M150 Primary generalized (osteo)arthritis: Secondary | ICD-10-CM | POA: Diagnosis not present

## 2023-10-18 DIAGNOSIS — S83241D Other tear of medial meniscus, current injury, right knee, subsequent encounter: Secondary | ICD-10-CM | POA: Diagnosis not present

## 2023-10-18 DIAGNOSIS — M419 Scoliosis, unspecified: Secondary | ICD-10-CM | POA: Diagnosis not present

## 2023-10-20 ENCOUNTER — Encounter (HOSPITAL_BASED_OUTPATIENT_CLINIC_OR_DEPARTMENT_OTHER): Payer: Self-pay

## 2023-10-20 ENCOUNTER — Other Ambulatory Visit: Payer: Self-pay

## 2023-10-20 ENCOUNTER — Emergency Department (HOSPITAL_BASED_OUTPATIENT_CLINIC_OR_DEPARTMENT_OTHER)
Admission: EM | Admit: 2023-10-20 | Discharge: 2023-10-20 | Disposition: A | Discharge status: Home / Self Care | Attending: Emergency Medicine | Admitting: Emergency Medicine

## 2023-10-20 DIAGNOSIS — M519 Unspecified thoracic, thoracolumbar and lumbosacral intervertebral disc disorder: Secondary | ICD-10-CM | POA: Diagnosis not present

## 2023-10-20 DIAGNOSIS — S83241D Other tear of medial meniscus, current injury, right knee, subsequent encounter: Secondary | ICD-10-CM | POA: Diagnosis not present

## 2023-10-20 DIAGNOSIS — S83271D Complex tear of lateral meniscus, current injury, right knee, subsequent encounter: Secondary | ICD-10-CM | POA: Diagnosis not present

## 2023-10-20 DIAGNOSIS — G8929 Other chronic pain: Secondary | ICD-10-CM | POA: Diagnosis not present

## 2023-10-20 DIAGNOSIS — R42 Dizziness and giddiness: Secondary | ICD-10-CM | POA: Insufficient documentation

## 2023-10-20 DIAGNOSIS — M545 Low back pain, unspecified: Secondary | ICD-10-CM | POA: Diagnosis not present

## 2023-10-20 DIAGNOSIS — M150 Primary generalized (osteo)arthritis: Secondary | ICD-10-CM | POA: Diagnosis not present

## 2023-10-20 DIAGNOSIS — E785 Hyperlipidemia, unspecified: Secondary | ICD-10-CM | POA: Diagnosis not present

## 2023-10-20 DIAGNOSIS — M419 Scoliosis, unspecified: Secondary | ICD-10-CM | POA: Diagnosis not present

## 2023-10-20 DIAGNOSIS — Z79891 Long term (current) use of opiate analgesic: Secondary | ICD-10-CM | POA: Diagnosis not present

## 2023-10-20 DIAGNOSIS — R110 Nausea: Secondary | ICD-10-CM | POA: Diagnosis not present

## 2023-10-20 DIAGNOSIS — Z859 Personal history of malignant neoplasm, unspecified: Secondary | ICD-10-CM | POA: Diagnosis not present

## 2023-10-20 DIAGNOSIS — M7121 Synovial cyst of popliteal space [Baker], right knee: Secondary | ICD-10-CM | POA: Diagnosis not present

## 2023-10-20 DIAGNOSIS — I700 Atherosclerosis of aorta: Secondary | ICD-10-CM | POA: Diagnosis not present

## 2023-10-20 DIAGNOSIS — Z7982 Long term (current) use of aspirin: Secondary | ICD-10-CM | POA: Diagnosis not present

## 2023-10-20 MED ORDER — MECLIZINE HCL 25 MG PO TABS
25.0000 mg | ORAL_TABLET | Freq: Once | ORAL | Status: AC
  Administered 2023-10-20: 25 mg via ORAL
  Filled 2023-10-20: qty 1

## 2023-10-20 MED ORDER — MECLIZINE HCL 25 MG PO TABS: 25.0000 mg | ORAL_TABLET | Freq: Three times a day (TID) | ORAL | 0 refills | Status: DC | PRN

## 2023-10-20 NOTE — ED Triage Notes (Signed)
 Arrives with complaints of worsening dizziness x3 days. Patient states that she got water in her ear earlier this week as well. Patient was sent here by a walk-in clinic for imaging.

## 2023-10-20 NOTE — ED Notes (Addendum)
 Discharge paperwork given and verbally understood. AO x4... No new obvious symptoms for a stroke besides the dizziness.[name] AasAaron [name]

## 2023-10-20 NOTE — ED Provider Notes (Signed)
 [place] EMERGENCY DEPARTMENT AT [facility] Provider Note   CSN: [id number] Arrival date & time: 10/20/23  1652     History  Chief Complaint  Patient presents with   Dizziness    [name] is a 83 y.o. female.  83 year old female presents with acute onset of dizziness.  Patient states that she got water in her ear and then she used a Q-tip in her ear which at that point made her feel as if the room was spinning around.  History of vertigo and is felt somewhat similar.  Denied any focal neurological deficits.  No headache.  No nausea or vomiting.  Symptoms better with keeping her head still.  Went to urgent care and they sent her here       Home Medications Prior to Admission medications   Medication Sig Start Date End Date Taking? Authorizing Provider  acetaminophen  (TYLENOL ) 650 MG CR tablet Take 650 mg by mouth daily.    [provider]  aspirin  EC 81 MG EC tablet Take 1 tablet (81 mg total) by mouth daily. Swallow whole. 06/01/20   Kraig Peru, MD  atorvastatin  (LIPITOR) 80 MG tablet Take 1 tablet (80 mg total) by mouth daily. 05/24/23   Jann Melody, MD  cyclobenzaprine  (FLEXERIL ) 5 MG tablet TAKE ONE TABLET BY MOUTH THREE TIMES A DAY AS NEEDED FOR MUSCLE SPASMS 07/21/21   Kirsteins, Cecilia Coe, MD  famotidine (PEPCID) 20 MG tablet Take 20 mg by mouth 2 (two) times daily.    [provider]  furosemide  (LASIX ) 20 MG tablet Take 1 tablet (20 mg total) by mouth every other day. 05/03/23   Chandrasekhar, Arneta Beverage A, MD  MAGNESIUM  PO Take 400 mg by mouth every evening.    [provider]  meclizine (ANTIVERT) 25 MG tablet as needed for dizziness.    [provider]  Multiple Vitamins-Minerals (MULTIVITAMIN WITH MINERALS) tablet Take 1 tablet by mouth daily.    [provider]  pantoprazole  (PROTONIX ) 40 MG tablet Take 1 tablet (40 mg total) by mouth daily. 06/05/20   Love, Renay Carota, PA-C  PARoxetine (PAXIL) 10  MG tablet Take 10 mg by mouth daily.    [provider]  polyethylene glycol (MIRALAX  / GLYCOLAX ) 17 g packet Take 17 g by mouth daily. 06/05/20   Love, Renay Carota, PA-C  senna-docusate (SENOKOT-S) 8.6-50 MG tablet Take 1 tablet by mouth 2 (two) times daily. 05/31/20   Patel, Pranav M, MD  vitamin C (ASCORBIC ACID) 500 MG tablet Take 500 mg by mouth daily.    [provider]      Allergies    Codeine    Review of Systems   Review of Systems  All other systems reviewed and are negative.   Physical Exam Updated Vital Signs BP (!) 150/83   Pulse 65   Resp 20   Ht 1.626 m (5\' 4" )   Wt 64.9 kg   SpO2 98%   BMI 24.56 kg/m  Physical Exam Vitals and nursing note reviewed.  Constitutional:      General: She is not in acute distress.    Appearance: Normal appearance. She is well-developed. She is not toxic-appearing.  HENT:     Head: Normocephalic and atraumatic.  Eyes:     General: Lids are normal.     Conjunctiva/sclera: Conjunctivae normal.     Pupils: Pupils are equal, round, and reactive to light.  Neck:     Thyroid : No thyroid  mass.  Trachea: No tracheal deviation.  Cardiovascular:     Rate and Rhythm: Normal rate and regular rhythm.     Heart sounds: Normal heart sounds. No murmur heard.    No gallop.  Pulmonary:     Effort: Pulmonary effort is normal. No respiratory distress.     Breath sounds: Normal breath sounds. No stridor. No decreased breath sounds, wheezing, rhonchi or rales.  Abdominal:     General: There is no distension.     Palpations: Abdomen is soft.     Tenderness: There is no abdominal tenderness. There is no rebound.  Musculoskeletal:        General: No tenderness. Normal range of motion.     Cervical back: Normal range of motion and neck supple.  Skin:    General: Skin is warm and dry.     Findings: No abrasion or rash.  Neurological:     General: No focal deficit present.     Mental Status: She is alert and oriented to  person, place, and time. Mental status is at baseline.     GCS: GCS eye subscore is 4. GCS verbal subscore is 5. GCS motor subscore is 6.     Cranial Nerves: No cranial nerve deficit.     Sensory: No sensory deficit.     Motor: Motor function is intact.  Psychiatric:        Attention and Perception: Attention normal.        Speech: Speech normal.        Behavior: Behavior normal.     ED Results / Procedures / Treatments   Labs (all labs ordered are listed, but only abnormal results are displayed) Labs Reviewed - No data to display  EKG None  Radiology No results found.  Procedures Procedures    Medications Ordered in ED Medications  meclizine (ANTIVERT) tablet 25 mg (has no administration in time range)    ED Course/ Medical Decision Making/ A&P                                 Medical Decision Making  Patient given Antivert and feels much better at this time.  Suspect that the Q-tip in your ears will cause a middle ear issue to cause her to have vertigo.  Her neurologic exam is stable this time.  Do not feel that she has any intracranial imaging.  Will discharge home        Final Clinical Impression(s) / ED Diagnoses Final diagnoses:  None    Rx / DC Orders ED Discharge Orders     None         Lind Repine, MD 10/20/23 1827

## 2023-10-24 DIAGNOSIS — I700 Atherosclerosis of aorta: Secondary | ICD-10-CM | POA: Diagnosis not present

## 2023-10-24 DIAGNOSIS — M419 Scoliosis, unspecified: Secondary | ICD-10-CM | POA: Diagnosis not present

## 2023-10-24 DIAGNOSIS — M150 Primary generalized (osteo)arthritis: Secondary | ICD-10-CM | POA: Diagnosis not present

## 2023-10-24 DIAGNOSIS — G8929 Other chronic pain: Secondary | ICD-10-CM | POA: Diagnosis not present

## 2023-10-24 DIAGNOSIS — M545 Low back pain, unspecified: Secondary | ICD-10-CM | POA: Diagnosis not present

## 2023-10-24 DIAGNOSIS — E785 Hyperlipidemia, unspecified: Secondary | ICD-10-CM | POA: Diagnosis not present

## 2023-10-24 DIAGNOSIS — M7121 Synovial cyst of popliteal space [Baker], right knee: Secondary | ICD-10-CM | POA: Diagnosis not present

## 2023-10-24 DIAGNOSIS — M519 Unspecified thoracic, thoracolumbar and lumbosacral intervertebral disc disorder: Secondary | ICD-10-CM | POA: Diagnosis not present

## 2023-10-24 DIAGNOSIS — S83271D Complex tear of lateral meniscus, current injury, right knee, subsequent encounter: Secondary | ICD-10-CM | POA: Diagnosis not present

## 2023-10-24 DIAGNOSIS — Z7982 Long term (current) use of aspirin: Secondary | ICD-10-CM | POA: Diagnosis not present

## 2023-10-24 DIAGNOSIS — Z859 Personal history of malignant neoplasm, unspecified: Secondary | ICD-10-CM | POA: Diagnosis not present

## 2023-10-24 DIAGNOSIS — S83241D Other tear of medial meniscus, current injury, right knee, subsequent encounter: Secondary | ICD-10-CM | POA: Diagnosis not present

## 2023-10-24 DIAGNOSIS — Z79891 Long term (current) use of opiate analgesic: Secondary | ICD-10-CM | POA: Diagnosis not present

## 2023-10-31 ENCOUNTER — Ambulatory Visit (INDEPENDENT_AMBULATORY_CARE_PROVIDER_SITE_OTHER): Payer: Medicare HMO

## 2023-10-31 DIAGNOSIS — I63031 Cerebral infarction due to thrombosis of right carotid artery: Secondary | ICD-10-CM | POA: Diagnosis not present

## 2023-10-31 LAB — CUP PACEART REMOTE DEVICE CHECK
Date Time Interrogation Session: 20250518230718
Implantable Pulse Generator Implant Date: 20211217

## 2023-11-01 ENCOUNTER — Ambulatory Visit: Payer: Self-pay | Admitting: Cardiology

## 2023-11-03 DIAGNOSIS — M150 Primary generalized (osteo)arthritis: Secondary | ICD-10-CM | POA: Diagnosis not present

## 2023-11-03 DIAGNOSIS — M7121 Synovial cyst of popliteal space [Baker], right knee: Secondary | ICD-10-CM | POA: Diagnosis not present

## 2023-11-03 DIAGNOSIS — G8929 Other chronic pain: Secondary | ICD-10-CM | POA: Diagnosis not present

## 2023-11-03 DIAGNOSIS — M419 Scoliosis, unspecified: Secondary | ICD-10-CM | POA: Diagnosis not present

## 2023-11-03 DIAGNOSIS — S83271D Complex tear of lateral meniscus, current injury, right knee, subsequent encounter: Secondary | ICD-10-CM | POA: Diagnosis not present

## 2023-11-03 DIAGNOSIS — M545 Low back pain, unspecified: Secondary | ICD-10-CM | POA: Diagnosis not present

## 2023-11-03 DIAGNOSIS — M519 Unspecified thoracic, thoracolumbar and lumbosacral intervertebral disc disorder: Secondary | ICD-10-CM | POA: Diagnosis not present

## 2023-11-03 DIAGNOSIS — I700 Atherosclerosis of aorta: Secondary | ICD-10-CM | POA: Diagnosis not present

## 2023-11-03 DIAGNOSIS — Z79891 Long term (current) use of opiate analgesic: Secondary | ICD-10-CM | POA: Diagnosis not present

## 2023-11-03 DIAGNOSIS — E785 Hyperlipidemia, unspecified: Secondary | ICD-10-CM | POA: Diagnosis not present

## 2023-11-03 DIAGNOSIS — Z859 Personal history of malignant neoplasm, unspecified: Secondary | ICD-10-CM | POA: Diagnosis not present

## 2023-11-03 DIAGNOSIS — S83241D Other tear of medial meniscus, current injury, right knee, subsequent encounter: Secondary | ICD-10-CM | POA: Diagnosis not present

## 2023-11-03 DIAGNOSIS — Z7982 Long term (current) use of aspirin: Secondary | ICD-10-CM | POA: Diagnosis not present

## 2023-11-10 DIAGNOSIS — S83271D Complex tear of lateral meniscus, current injury, right knee, subsequent encounter: Secondary | ICD-10-CM | POA: Diagnosis not present

## 2023-11-10 DIAGNOSIS — Z4789 Encounter for other orthopedic aftercare: Secondary | ICD-10-CM | POA: Diagnosis not present

## 2023-11-10 NOTE — Addendum Note (Signed)
 Addended by: Edra Govern D on: 11/10/2023 09:17 AM   Modules accepted: Orders

## 2023-11-10 NOTE — Progress Notes (Signed)
 Carelink Summary Report / Loop Recorder

## 2023-11-11 DIAGNOSIS — I700 Atherosclerosis of aorta: Secondary | ICD-10-CM | POA: Diagnosis not present

## 2023-11-11 DIAGNOSIS — M7121 Synovial cyst of popliteal space [Baker], right knee: Secondary | ICD-10-CM | POA: Diagnosis not present

## 2023-11-11 DIAGNOSIS — E785 Hyperlipidemia, unspecified: Secondary | ICD-10-CM | POA: Diagnosis not present

## 2023-11-11 DIAGNOSIS — M519 Unspecified thoracic, thoracolumbar and lumbosacral intervertebral disc disorder: Secondary | ICD-10-CM | POA: Diagnosis not present

## 2023-11-11 DIAGNOSIS — M545 Low back pain, unspecified: Secondary | ICD-10-CM | POA: Diagnosis not present

## 2023-11-11 DIAGNOSIS — Z859 Personal history of malignant neoplasm, unspecified: Secondary | ICD-10-CM | POA: Diagnosis not present

## 2023-11-11 DIAGNOSIS — G8929 Other chronic pain: Secondary | ICD-10-CM | POA: Diagnosis not present

## 2023-11-11 DIAGNOSIS — Z7982 Long term (current) use of aspirin: Secondary | ICD-10-CM | POA: Diagnosis not present

## 2023-11-11 DIAGNOSIS — Z79891 Long term (current) use of opiate analgesic: Secondary | ICD-10-CM | POA: Diagnosis not present

## 2023-11-11 DIAGNOSIS — M150 Primary generalized (osteo)arthritis: Secondary | ICD-10-CM | POA: Diagnosis not present

## 2023-11-11 DIAGNOSIS — S83241D Other tear of medial meniscus, current injury, right knee, subsequent encounter: Secondary | ICD-10-CM | POA: Diagnosis not present

## 2023-11-11 DIAGNOSIS — S83271D Complex tear of lateral meniscus, current injury, right knee, subsequent encounter: Secondary | ICD-10-CM | POA: Diagnosis not present

## 2023-11-11 DIAGNOSIS — M419 Scoliosis, unspecified: Secondary | ICD-10-CM | POA: Diagnosis not present

## 2023-11-12 DIAGNOSIS — N183 Chronic kidney disease, stage 3 unspecified: Secondary | ICD-10-CM | POA: Diagnosis not present

## 2023-11-12 DIAGNOSIS — E1122 Type 2 diabetes mellitus with diabetic chronic kidney disease: Secondary | ICD-10-CM | POA: Diagnosis not present

## 2023-11-12 DIAGNOSIS — E785 Hyperlipidemia, unspecified: Secondary | ICD-10-CM | POA: Diagnosis not present

## 2023-11-15 DIAGNOSIS — G8929 Other chronic pain: Secondary | ICD-10-CM | POA: Diagnosis not present

## 2023-11-15 DIAGNOSIS — M419 Scoliosis, unspecified: Secondary | ICD-10-CM | POA: Diagnosis not present

## 2023-11-15 DIAGNOSIS — Z859 Personal history of malignant neoplasm, unspecified: Secondary | ICD-10-CM | POA: Diagnosis not present

## 2023-11-15 DIAGNOSIS — E785 Hyperlipidemia, unspecified: Secondary | ICD-10-CM | POA: Diagnosis not present

## 2023-11-15 DIAGNOSIS — M7121 Synovial cyst of popliteal space [Baker], right knee: Secondary | ICD-10-CM | POA: Diagnosis not present

## 2023-11-15 DIAGNOSIS — Z7982 Long term (current) use of aspirin: Secondary | ICD-10-CM | POA: Diagnosis not present

## 2023-11-15 DIAGNOSIS — S83271D Complex tear of lateral meniscus, current injury, right knee, subsequent encounter: Secondary | ICD-10-CM | POA: Diagnosis not present

## 2023-11-15 DIAGNOSIS — S83241D Other tear of medial meniscus, current injury, right knee, subsequent encounter: Secondary | ICD-10-CM | POA: Diagnosis not present

## 2023-11-15 DIAGNOSIS — I700 Atherosclerosis of aorta: Secondary | ICD-10-CM | POA: Diagnosis not present

## 2023-11-15 DIAGNOSIS — M150 Primary generalized (osteo)arthritis: Secondary | ICD-10-CM | POA: Diagnosis not present

## 2023-11-15 DIAGNOSIS — Z79891 Long term (current) use of opiate analgesic: Secondary | ICD-10-CM | POA: Diagnosis not present

## 2023-11-15 DIAGNOSIS — M519 Unspecified thoracic, thoracolumbar and lumbosacral intervertebral disc disorder: Secondary | ICD-10-CM | POA: Diagnosis not present

## 2023-11-15 DIAGNOSIS — M545 Low back pain, unspecified: Secondary | ICD-10-CM | POA: Diagnosis not present

## 2023-11-21 DIAGNOSIS — G8929 Other chronic pain: Secondary | ICD-10-CM | POA: Diagnosis not present

## 2023-11-21 DIAGNOSIS — Z7982 Long term (current) use of aspirin: Secondary | ICD-10-CM | POA: Diagnosis not present

## 2023-11-21 DIAGNOSIS — M419 Scoliosis, unspecified: Secondary | ICD-10-CM | POA: Diagnosis not present

## 2023-11-21 DIAGNOSIS — M150 Primary generalized (osteo)arthritis: Secondary | ICD-10-CM | POA: Diagnosis not present

## 2023-11-21 DIAGNOSIS — M519 Unspecified thoracic, thoracolumbar and lumbosacral intervertebral disc disorder: Secondary | ICD-10-CM | POA: Diagnosis not present

## 2023-11-21 DIAGNOSIS — Z859 Personal history of malignant neoplasm, unspecified: Secondary | ICD-10-CM | POA: Diagnosis not present

## 2023-11-21 DIAGNOSIS — E785 Hyperlipidemia, unspecified: Secondary | ICD-10-CM | POA: Diagnosis not present

## 2023-11-21 DIAGNOSIS — Z79891 Long term (current) use of opiate analgesic: Secondary | ICD-10-CM | POA: Diagnosis not present

## 2023-11-21 DIAGNOSIS — S83241D Other tear of medial meniscus, current injury, right knee, subsequent encounter: Secondary | ICD-10-CM | POA: Diagnosis not present

## 2023-11-21 DIAGNOSIS — I700 Atherosclerosis of aorta: Secondary | ICD-10-CM | POA: Diagnosis not present

## 2023-11-21 DIAGNOSIS — M7121 Synovial cyst of popliteal space [Baker], right knee: Secondary | ICD-10-CM | POA: Diagnosis not present

## 2023-11-21 DIAGNOSIS — S83271D Complex tear of lateral meniscus, current injury, right knee, subsequent encounter: Secondary | ICD-10-CM | POA: Diagnosis not present

## 2023-11-21 DIAGNOSIS — M545 Low back pain, unspecified: Secondary | ICD-10-CM | POA: Diagnosis not present

## 2023-11-29 ENCOUNTER — Ambulatory Visit: Attending: Physician Assistant | Admitting: Physician Assistant

## 2023-11-29 ENCOUNTER — Encounter: Payer: Self-pay | Admitting: Physician Assistant

## 2023-11-29 VITALS — BP 123/50 | HR 78 | Ht 64.0 in | Wt 136.4 lb

## 2023-11-29 DIAGNOSIS — E785 Hyperlipidemia, unspecified: Secondary | ICD-10-CM | POA: Diagnosis not present

## 2023-11-29 DIAGNOSIS — Z8673 Personal history of transient ischemic attack (TIA), and cerebral infarction without residual deficits: Secondary | ICD-10-CM

## 2023-11-29 DIAGNOSIS — M7989 Other specified soft tissue disorders: Secondary | ICD-10-CM

## 2023-11-29 DIAGNOSIS — H8110 Benign paroxysmal vertigo, unspecified ear: Secondary | ICD-10-CM

## 2023-11-29 NOTE — Assessment & Plan Note (Signed)
 Optimal on Atorvastatin  80 mg once daily.

## 2023-11-29 NOTE — Patient Instructions (Signed)
 Medication Instructions:  Your physician recommends that you continue on your current medications as directed. Please refer to the Current Medication list given to you today.  *If you need a refill on your cardiac medications before your next appointment, please call your pharmacy*  Lab Work: None ordered  If you have labs (blood work) drawn today and your tests are completely normal, you will receive your results only by: MyChart Message (if you have MyChart) OR A paper copy in the mail If you have any lab test that is abnormal or we need to change your treatment, we will call you to review the results.  Testing/Procedures: None ordered  Follow-Up: At [facility], you and your health needs are our priority.  As part of our continuing mission to provide you with exceptional heart care, our providers are all part of one team.  This team includes your primary Cardiologist (physician) and Advanced Practice Providers or APPs (Physician Assistants and Nurse Practitioners) who all work together to provide you with the care you need, when you need it.  Your next appointment:   1 year(s)  Provider:   Jann Melody, MD    We recommend signing up for the patient portal called MyChart.  Sign up information is provided on this After Visit Summary.  MyChart is used to connect with patients for Virtual Visits (Telemedicine).  Patients are able to view lab/test results, encounter notes, upcoming appointments, etc.  Non-urgent messages can be sent to your provider as well.   To learn more about what you can do with MyChart, go to [email or web address].   Other Instructions

## 2023-11-29 NOTE — Assessment & Plan Note (Signed)
 S/p ILR. No atrial fibrillation noted to date. She is only on ASA 81 mg once daily now.

## 2023-11-29 NOTE — Progress Notes (Signed)
    OFFICE NOTE:    Date:  11/29/2023  ID:  [name], DOB June 26, 1940, MRN [id number] PCP: Alejandro Hurt, FNP  [facility] Providers Cardiologist:  Jann Melody, MD       Patient Profile:  Hx of CVA (DAPT) S/p ILR  TTE 06/22/2023: EF 60-65, no RWMA, normal RVSF, RVSP 20.5, mild MR Hyperlipidemia  Renal Cell CA  Hx of falls  Leg edema        Discussed the use of AI scribe software for clinical note transcription with the patient, who gave verbal consent to proceed. History of Present Illness [name] is a 83 y.o. female who returns for follow up of HL, prior CVA, edema. Last seen by Dr. Larance Plater in 04/2023.  Lasix  was started every other day at that time for lower extremity edema.  BNP was normal at 220.  Echocardiogram demonstrated normal EF, mild MR.  Follow-up labs in December 2024 demonstrated LDL of 82.  Atorvastatin  was increased to 80 mg daily.  Follow-up lipids in March 2025 improved with LDL 55, ALT 18.  She notes her Plavix  was stopped in March 2022 and is currently only on aspirin . She has an implantable loop recorder (ILR) placed after her stroke, which has not shown any arrhythmias, including atrial fibrillation. She recently had some dizziness and was evaluated at an urgent care and treated for suspected vertigo with meclizine , which alleviated her symptoms. No chest pain, shortness of breath, or passing out. She reports bruising easily, which she attributes to the aspirin . She notes that her swelling is not significant at present.   ROS-See HPI    Studies Reviewed:      Risk Assessment/Calculations:          Physical Exam:  VS:  BP (!) 123/50   Pulse 78   Ht 5' 4 (1.626 m)   Wt 136 lb 6.4 oz (61.9 kg)   SpO2 95%   BMI 23.41 kg/m    Wt Readings from Last 3 Encounters:  11/29/23 136 lb 6.4 oz (61.9 kg)  10/20/23 143 lb 1.3 oz (64.9 kg)  07/29/23 143 lb 1.3 oz (64.9 kg)    Constitutional:      Appearance: Healthy appearance.  Not in distress.  Neck:     Vascular: No carotid bruit.  Pulmonary:     Breath sounds: Normal breath sounds. No wheezing. No rales.  Cardiovascular:     Normal rate. Regular rhythm.     Murmurs: There is no murmur.  Edema:    Peripheral edema absent.        Assessment and Plan:    Assessment & Plan History of stroke S/p ILR. No atrial fibrillation noted to date. She is only on ASA 81 mg once daily now. Leg swelling Overall well controlled with prn Lasx. Hyperlipidemia LDL goal <70 Optimal on Atorvastatin  80 mg once daily.  Benign paroxysmal positional vertigo, unspecified laterality Well managed with meclizine . I advised her to follow up with primary care if it recurs so she can be referred for vestib rehab.        Dispo:  Return in about 1 year (around 11/28/2024) for Routine Follow Up, w/ Dr. Paulita Boss.  Signed, Marlyse Single, PA-C

## 2023-12-01 ENCOUNTER — Ambulatory Visit (INDEPENDENT_AMBULATORY_CARE_PROVIDER_SITE_OTHER)

## 2023-12-01 DIAGNOSIS — I63031 Cerebral infarction due to thrombosis of right carotid artery: Secondary | ICD-10-CM | POA: Diagnosis not present

## 2023-12-01 LAB — CUP PACEART REMOTE DEVICE CHECK
Date Time Interrogation Session: 20250618230939
Implantable Pulse Generator Implant Date: 20211217

## 2023-12-02 ENCOUNTER — Ambulatory Visit: Payer: Self-pay | Admitting: Cardiology

## 2023-12-05 ENCOUNTER — Ambulatory Visit: Payer: Medicare HMO

## 2023-12-05 DIAGNOSIS — M7121 Synovial cyst of popliteal space [Baker], right knee: Secondary | ICD-10-CM | POA: Diagnosis not present

## 2023-12-05 DIAGNOSIS — Z79891 Long term (current) use of opiate analgesic: Secondary | ICD-10-CM | POA: Diagnosis not present

## 2023-12-05 DIAGNOSIS — M545 Low back pain, unspecified: Secondary | ICD-10-CM | POA: Diagnosis not present

## 2023-12-05 DIAGNOSIS — G8929 Other chronic pain: Secondary | ICD-10-CM | POA: Diagnosis not present

## 2023-12-05 DIAGNOSIS — S83271D Complex tear of lateral meniscus, current injury, right knee, subsequent encounter: Secondary | ICD-10-CM | POA: Diagnosis not present

## 2023-12-05 DIAGNOSIS — M150 Primary generalized (osteo)arthritis: Secondary | ICD-10-CM | POA: Diagnosis not present

## 2023-12-05 DIAGNOSIS — S83241D Other tear of medial meniscus, current injury, right knee, subsequent encounter: Secondary | ICD-10-CM | POA: Diagnosis not present

## 2023-12-05 DIAGNOSIS — E785 Hyperlipidemia, unspecified: Secondary | ICD-10-CM | POA: Diagnosis not present

## 2023-12-05 DIAGNOSIS — Z859 Personal history of malignant neoplasm, unspecified: Secondary | ICD-10-CM | POA: Diagnosis not present

## 2023-12-05 DIAGNOSIS — Z7982 Long term (current) use of aspirin: Secondary | ICD-10-CM | POA: Diagnosis not present

## 2023-12-05 DIAGNOSIS — I700 Atherosclerosis of aorta: Secondary | ICD-10-CM | POA: Diagnosis not present

## 2023-12-05 DIAGNOSIS — M419 Scoliosis, unspecified: Secondary | ICD-10-CM | POA: Diagnosis not present

## 2023-12-05 DIAGNOSIS — M519 Unspecified thoracic, thoracolumbar and lumbosacral intervertebral disc disorder: Secondary | ICD-10-CM | POA: Diagnosis not present

## 2023-12-14 DIAGNOSIS — M1711 Unilateral primary osteoarthritis, right knee: Secondary | ICD-10-CM | POA: Diagnosis not present

## 2023-12-14 DIAGNOSIS — Z4789 Encounter for other orthopedic aftercare: Secondary | ICD-10-CM | POA: Diagnosis not present

## 2023-12-14 NOTE — Progress Notes (Signed)
 Carelink Summary Report / Loop Recorder

## 2023-12-15 DIAGNOSIS — H903 Sensorineural hearing loss, bilateral: Secondary | ICD-10-CM | POA: Diagnosis not present

## 2023-12-30 ENCOUNTER — Encounter: Payer: Self-pay | Admitting: Advanced Practice Midwife

## 2024-01-02 ENCOUNTER — Ambulatory Visit (INDEPENDENT_AMBULATORY_CARE_PROVIDER_SITE_OTHER)

## 2024-01-02 DIAGNOSIS — I63031 Cerebral infarction due to thrombosis of right carotid artery: Secondary | ICD-10-CM

## 2024-01-03 LAB — CUP PACEART REMOTE DEVICE CHECK
Date Time Interrogation Session: 20250720230907
Implantable Pulse Generator Implant Date: 20211217

## 2024-01-05 ENCOUNTER — Ambulatory Visit: Payer: Self-pay | Admitting: Cardiology

## 2024-01-09 ENCOUNTER — Ambulatory Visit: Payer: Medicare HMO

## 2024-01-12 DIAGNOSIS — H903 Sensorineural hearing loss, bilateral: Secondary | ICD-10-CM | POA: Diagnosis not present

## 2024-01-31 DIAGNOSIS — M25511 Pain in right shoulder: Secondary | ICD-10-CM | POA: Diagnosis not present

## 2024-01-31 DIAGNOSIS — M25512 Pain in left shoulder: Secondary | ICD-10-CM | POA: Diagnosis not present

## 2024-01-31 DIAGNOSIS — M1711 Unilateral primary osteoarthritis, right knee: Secondary | ICD-10-CM | POA: Diagnosis not present

## 2024-01-31 NOTE — Progress Notes (Signed)
 Carelink Summary Report / Loop Recorder

## 2024-02-01 ENCOUNTER — Telehealth: Payer: Self-pay

## 2024-02-01 NOTE — Telephone Encounter (Signed)
 Left message to call back to schedule tele proep appt.

## 2024-02-01 NOTE — Telephone Encounter (Signed)
   Name: [name]  DOB: Mar 17, 1941  MRN: [id number]  Primary Cardiologist: Stanly DELENA Leavens, MD   Preoperative team, please contact this patient and set up a phone call appointment for further preoperative risk assessment. Please obtain consent and complete medication review. Thank you for your help.  I confirm that guidance regarding antiplatelet and oral anticoagulation therapy has been completed and, if necessary, noted below.  Per office protocol, if patient is without any new symptoms or concerns at the time of their virtual visit,she may hold Aspirin  for 7 days prior to procedure. Please resume aspirin  as soon as possible postprocedure, at the discretion of the surgeon.    I also confirmed the patient resides in the state of [place] . As per [facility] Medical Board telemedicine laws, the patient must reside in the state in which the provider is licensed.   [name], NP 02/01/2024, 10:17 AM [name]

## 2024-02-01 NOTE — Telephone Encounter (Signed)
   Pre-operative Risk Assessment    Patient Name: [name]  DOB: January 02, 1941 MRN: [id number]   Date of last office visit: 11/29/23 GLENDIA FERRIER, PA-C Date of next office visit: NONE   Request for Surgical Clearance    Procedure:  RIGHT TOTAL KNEE ARTHROPLASTY  Date of Surgery:  Clearance TBD                                Surgeon:  DR ELSPETH HER Surgeon's Group or Practice Name:  [facility] Phone number:  [phone] Fax number:  [phone]    ATTN: MEGAN DAVIS   Type of Clearance Requested:   - Medical  - Pharmacy:  Hold Aspirin      Type of Anesthesia:  CHOICE   Additional requests/questions:    SignedLucie DELENA Ku   02/01/2024, 8:52 AM

## 2024-02-02 ENCOUNTER — Ambulatory Visit: Payer: Self-pay | Admitting: Cardiology

## 2024-02-02 ENCOUNTER — Telehealth: Payer: Self-pay

## 2024-02-02 ENCOUNTER — Ambulatory Visit (INDEPENDENT_AMBULATORY_CARE_PROVIDER_SITE_OTHER)

## 2024-02-02 DIAGNOSIS — I63031 Cerebral infarction due to thrombosis of right carotid artery: Secondary | ICD-10-CM | POA: Diagnosis not present

## 2024-02-02 LAB — CUP PACEART REMOTE DEVICE CHECK
Date Time Interrogation Session: 20250820230653
Implantable Pulse Generator Implant Date: 20211217

## 2024-02-02 NOTE — Telephone Encounter (Signed)
 Med req and consent done. Appt scheduled for 02/10/24 @ 3pm.

## 2024-02-02 NOTE — Telephone Encounter (Signed)
  Patient Consent for Virtual Visit         [name] has provided verbal consent on 02/02/2024 for a virtual visit (video or telephone).  Med req and consent done. Appointment scheduled for Friday, 02/10/2024 @ 3pm   CONSENT FOR VIRTUAL VISIT FOR:  [name]  By participating in this virtual visit I agree to the following:  I hereby voluntarily request, consent and authorize [facility] and its employed or contracted physicians, physician assistants, nurse practitioners or other licensed health care professionals (the Practitioner), to provide me with telemedicine health care services (the "Services) as deemed necessary by the treating Practitioner. I acknowledge and consent to receive the Services by the Practitioner via telemedicine. I understand that the telemedicine visit will involve communicating with the Practitioner through live audiovisual communication technology and the disclosure of certain medical information by electronic transmission. I acknowledge that I have been given the opportunity to request an in-person assessment or other available alternative prior to the telemedicine visit and am voluntarily participating in the telemedicine visit.  I understand that I have the right to withhold or withdraw my consent to the use of telemedicine in the course of my care at any time, without affecting my right to future care or treatment, and that the Practitioner or I may terminate the telemedicine visit at any time. I understand that I have the right to inspect all information obtained and/or recorded in the course of the telemedicine visit and may receive copies of available information for a reasonable fee.  I understand that some of the potential risks of receiving the Services via telemedicine include:  Delay or interruption in medical evaluation due to technological equipment failure or disruption; Information transmitted may not be sufficient (e.g. poor resolution  of images) to allow for appropriate medical decision making by the Practitioner; and/or  In rare instances, security protocols could fail, causing a breach of personal health information.  Furthermore, I acknowledge that it is my responsibility to provide information about my medical history, conditions and care that is complete and accurate to the best of my ability. I acknowledge that Practitioner's advice, recommendations, and/or decision may be based on factors not within their control, such as incomplete or inaccurate data provided by me or distortions of diagnostic images or specimens that may result from electronic transmissions. I understand that the practice of medicine is not an exact science and that Practitioner makes no warranties or guarantees regarding treatment outcomes. I acknowledge that a copy of this consent can be made available to me via my patient portal [facility] MyChart), or I can request a printed copy by calling the office of [facility].    I understand that my insurance will be billed for this visit.   I have read or had this consent read to me. I understand the contents of this consent, which adequately explains the benefits and risks of the Services being provided via telemedicine.  I have been provided ample opportunity to ask questions regarding this consent and the Services and have had my questions answered to my satisfaction. I give my informed consent for the services to be provided through the use of telemedicine in my medical care

## 2024-02-10 ENCOUNTER — Ambulatory Visit: Attending: Cardiology

## 2024-02-10 DIAGNOSIS — Z01810 Encounter for preprocedural cardiovascular examination: Secondary | ICD-10-CM | POA: Diagnosis not present

## 2024-02-10 NOTE — Progress Notes (Signed)
 Virtual Visit via Telephone Note   Because of [name] co-morbid illnesses, she is at least at moderate risk for complications without adequate follow up.  This format is felt to be most appropriate for this patient at this time.  Due to technical limitations with video connection (technology), today's appointment will be conducted as an audio only telehealth visit, and ROBERTTA HALFHILL verbally agreed to proceed in this manner.   All issues noted in this document were discussed and addressed.  No physical exam could be performed with this format.  Evaluation Performed:  Preoperative cardiovascular risk assessment _____________   Date:  02/10/2024   Patient ID:  [name], [name] 09-26-1940, MRN [id number] Patient Location:  Home Provider location:   Office  Primary Care Provider:  Marvene Prentice SAUNDERS, FNP Primary Cardiologist:  Stanly DELENA Leavens, MD  Chief Complaint / Patient Profile   83 y.o. y/o female with a h/o CVA s/p ILR, HLD, vertigo, renal CA who is pending right total knee arthroplasty and presents today for telephonic preoperative cardiovascular risk assessment.  History of Present Illness    [name] is a 83 y.o. female who presents via audio/video conferencing for a telehealth visit today.  Pt was last seen in cardiology clinic on 11/29/2023 by Glendia Ferrier, PA.  At that time [name] was doing well with no complaints of chest pain or episodes of passing out since previous visit.  The patient is now pending procedure as outlined above. Since her last visit, she has been doing well with no new cardiac complaints.  She is limited with ADLs due to her arthritic right knee but able to complete greater than 4 METS of activity.   She denies chest pain, shortness of breath, lower extremity edema, fatigue, palpitations, melena, hematuria, hemoptysis, diaphoresis, weakness, presyncope, syncope, orthopnea, and PND.    Past Medical History    Past Medical  History:  Diagnosis Date   Blood transfusion without reported diagnosis    with hip fracture-83 years old   Cancer [facility]) 1983   Kidney Cancer   Diverticulosis    GERD (gastroesophageal reflux disease)    Personal history of kidney cancer    Past Surgical History:  Procedure Laterality Date   ABDOMINAL HYSTERECTOMY     COLON SURGERY     Severe sigmoid diverticulitis   COLONOSCOPY     FOOT SURGERY     HIP SURGERY     Kidney cancer     Kidney removed   LOOP RECORDER INSERTION N/A 05/30/2020   Procedure: LOOP RECORDER INSERTION;  Surgeon: Cindie Ole DASEN, MD;  Location: [facility] INVASIVE CV LAB;  Service: Cardiovascular;  Laterality: N/A;   POLYPECTOMY      Allergies  Allergies  Allergen Reactions   Codeine Other (See Comments)    Pt unsure if really allergic to this    Home Medications    Prior to Admission medications   Medication Sig Start Date End Date Taking? Authorizing Provider  acetaminophen  (TYLENOL ) 650 MG CR tablet Take 650 mg by mouth daily.    [provider]  aspirin  EC 81 MG EC tablet Take 1 tablet (81 mg total) by mouth daily. Swallow whole. 06/01/20   Tobie Yetta HERO, MD  atorvastatin  (LIPITOR) 80 MG tablet Take 1 tablet (80 mg total) by mouth daily. 05/24/23   Chandrasekhar, Stanly DELENA, MD  cyclobenzaprine  (FLEXERIL ) 5 MG tablet TAKE ONE TABLET BY MOUTH THREE TIMES A DAY AS NEEDED FOR MUSCLE  SPASMS 07/21/21   Kirsteins, Prentice BRAVO, MD  famotidine (PEPCID) 20 MG tablet Take 20 mg by mouth 2 (two) times daily.    [provider]  furosemide  (LASIX ) 20 MG tablet Take 1 tablet (20 mg total) by mouth every other day. 05/03/23   Santo Stanly LABOR, MD  MAGNESIUM  PO Take 400 mg by mouth every evening.    [provider]  meclizine  (ANTIVERT ) 25 MG tablet as needed for dizziness.    [provider]  meclizine  (ANTIVERT ) 25 MG tablet Take 1 tablet (25 mg total) by mouth 3 (three) times daily as needed for dizziness. Patient not  taking: Reported on 11/29/2023 10/20/23   Dasie Faden, MD  Multiple Vitamins-Minerals (MULTIVITAMIN WITH MINERALS) tablet Take 1 tablet by mouth daily.    [provider]  pantoprazole  (PROTONIX ) 40 MG tablet Take 1 tablet (40 mg total) by mouth daily. 06/05/20   Love, Sharlet RAMAN, PA-C  PARoxetine (PAXIL) 10 MG tablet Take 10 mg by mouth daily.    [provider]  polyethylene glycol (MIRALAX  / GLYCOLAX ) 17 g packet Take 17 g by mouth daily. 06/05/20   Love, Sharlet RAMAN, PA-C  senna-docusate (SENOKOT-S) 8.6-50 MG tablet Take 1 tablet by mouth 2 (two) times daily. 05/31/20   Patel, Pranav M, MD  vitamin C (ASCORBIC ACID) 500 MG tablet Take 500 mg by mouth daily.    [provider]    Physical Exam    Vital Signs:  Pamela C Downen does not have vital signs available for review today.  Given telephonic nature of communication, physical exam is limited. AAOx3. NAD. Normal affect.  Speech and respirations are unlabored.  Accessory Clinical Findings    None  Assessment & Plan    1.  Preoperative Cardiovascular Risk Assessment: - Patient's RCRI score 6.6%  The patient affirms she has been doing well without any new cardiac symptoms. They are able to achieve 5 METS without cardiac limitations. Therefore, based on ACC/AHA guidelines, the patient would be at acceptable risk for the planned procedure without further cardiovascular testing. The patient was advised that if she develops new symptoms prior to surgery to contact our office to arrange for a follow-up visit, and she verbalized understanding.   The patient was advised that if she develops new symptoms prior to surgery to contact our office to arrange for a follow-up visit, and she verbalized understanding.  Patient can hold ASA 81 mg 7 days prior to procedure  A copy of this note will be routed to requesting surgeon.  Time:   Today, I have spent 6 minutes with the patient with telehealth technology discussing  medical history, symptoms, and management plan.     Wyn Raddle, Jackee Shove, NP  02/10/2024, 7:10 AM

## 2024-02-12 DIAGNOSIS — E785 Hyperlipidemia, unspecified: Secondary | ICD-10-CM | POA: Diagnosis not present

## 2024-02-12 DIAGNOSIS — E1122 Type 2 diabetes mellitus with diabetic chronic kidney disease: Secondary | ICD-10-CM | POA: Diagnosis not present

## 2024-02-12 DIAGNOSIS — N183 Chronic kidney disease, stage 3 unspecified: Secondary | ICD-10-CM | POA: Diagnosis not present

## 2024-02-14 ENCOUNTER — Ambulatory Visit: Payer: Medicare HMO

## 2024-02-15 DIAGNOSIS — K219 Gastro-esophageal reflux disease without esophagitis: Secondary | ICD-10-CM | POA: Diagnosis not present

## 2024-02-15 DIAGNOSIS — Z6823 Body mass index (BMI) 23.0-23.9, adult: Secondary | ICD-10-CM | POA: Diagnosis not present

## 2024-02-15 DIAGNOSIS — E785 Hyperlipidemia, unspecified: Secondary | ICD-10-CM | POA: Diagnosis not present

## 2024-02-15 DIAGNOSIS — E1122 Type 2 diabetes mellitus with diabetic chronic kidney disease: Secondary | ICD-10-CM | POA: Diagnosis not present

## 2024-02-15 DIAGNOSIS — M25569 Pain in unspecified knee: Secondary | ICD-10-CM | POA: Diagnosis not present

## 2024-02-15 DIAGNOSIS — Z23 Encounter for immunization: Secondary | ICD-10-CM | POA: Diagnosis not present

## 2024-02-15 DIAGNOSIS — Z01818 Encounter for other preprocedural examination: Secondary | ICD-10-CM | POA: Diagnosis not present

## 2024-02-29 ENCOUNTER — Other Ambulatory Visit: Payer: Self-pay

## 2024-03-05 ENCOUNTER — Ambulatory Visit (INDEPENDENT_AMBULATORY_CARE_PROVIDER_SITE_OTHER)

## 2024-03-05 DIAGNOSIS — I63031 Cerebral infarction due to thrombosis of right carotid artery: Secondary | ICD-10-CM

## 2024-03-05 LAB — CUP PACEART REMOTE DEVICE CHECK
Date Time Interrogation Session: 20250921230634
Implantable Pulse Generator Implant Date: 20211217

## 2024-03-06 ENCOUNTER — Ambulatory Visit (INDEPENDENT_AMBULATORY_CARE_PROVIDER_SITE_OTHER): Admitting: Podiatry

## 2024-03-06 VITALS — Ht 64.0 in | Wt 136.4 lb

## 2024-03-06 DIAGNOSIS — M21619 Bunion of unspecified foot: Secondary | ICD-10-CM

## 2024-03-06 DIAGNOSIS — M205X2 Other deformities of toe(s) (acquired), left foot: Secondary | ICD-10-CM | POA: Diagnosis not present

## 2024-03-06 NOTE — Progress Notes (Signed)
 Remote Loop Recorder Transmission

## 2024-03-06 NOTE — Progress Notes (Signed)
 Subjective:   Patient ID: [name], female   DOB: 83 y.o.   MRN: [id number]   HPI Chief Complaint  Patient presents with   Callouses    Rm 12 Pt has a corn on top of the second toe (left).   83 year old female presents the office with above concerns.  She has a corn, spot on the top of her left second toe.  She has had rubbing that shoes and become very painful.  She previously had surgery help straighten the toe several years ago but she has a bunion as well and the second toe sits over top of the big toe.  Denies any recent injuries or any drainage or pus.  No open lesions.   Review of Systems  All other systems reviewed and are negative.       Objective:  Physical Exam  General: AAO x3, NAD  Dermatological: On the dorsal aspect left second toe DIPJ is a hyperkeratotic lesion.  There is no underlying ulceration, drainage or signs of infection.  Minimal edema present which could be a start of a small mucoid cyst.    Vascular: Dorsalis Pedis artery and Posterior Tibial artery pedal pulses are 2/4 bilateral with immedate capillary fill time. There is no pain with calf compression, swelling, warmth, erythema.   Neruologic: Grossly intact via light touch bilateral.   Musculoskeletal: Bunion deformities noted with the second toe overlapping the hallux with tenderness palpation directly on the DIPJ dorsally.  No erythema or warmth or any signs of infection  Gait: Unassisted, Nonantalgic.       Assessment:   Skin lesion, possible cyst left second toe with digital contracture     Plan:  -Treatment options discussed including all alternatives, risks, and complications -Etiology of symptoms were discussed - I cleaned skin with alcohol and used ethyl chloride to help anesthetize the skin.  Sharply debrided hyperkeratotic lesion without any complications or bleeding.  I do think more this is coming from pressure and she also has possibly started a mucoid cyst although minimal but  no drainable collection noted today.  Discussed if needed we can try to inject this.  I dispensed offloading pads to help hold the second toe down.  Discussed she is to avoid excess pressure.  We did briefly discuss surgery if needed as she had questions about this. She has an upcoming knee replacement scheduled.   Of note the patient was incorrectly scheduled for Dr. Janit for next year instead of today. I was able to see her today and apologized for the scheduling error.   Return if symptoms worsen or fail to improve.  [name]

## 2024-03-07 ENCOUNTER — Ambulatory Visit: Payer: Self-pay | Admitting: Cardiology

## 2024-03-09 DIAGNOSIS — N390 Urinary tract infection, site not specified: Secondary | ICD-10-CM | POA: Diagnosis not present

## 2024-03-09 DIAGNOSIS — E1122 Type 2 diabetes mellitus with diabetic chronic kidney disease: Secondary | ICD-10-CM | POA: Diagnosis not present

## 2024-03-09 DIAGNOSIS — Z6823 Body mass index (BMI) 23.0-23.9, adult: Secondary | ICD-10-CM | POA: Diagnosis not present

## 2024-03-09 DIAGNOSIS — E785 Hyperlipidemia, unspecified: Secondary | ICD-10-CM | POA: Diagnosis not present

## 2024-03-09 DIAGNOSIS — N183 Chronic kidney disease, stage 3 unspecified: Secondary | ICD-10-CM | POA: Diagnosis not present

## 2024-03-09 DIAGNOSIS — R059 Cough, unspecified: Secondary | ICD-10-CM | POA: Diagnosis not present

## 2024-03-09 DIAGNOSIS — Z0000 Encounter for general adult medical examination without abnormal findings: Secondary | ICD-10-CM | POA: Diagnosis not present

## 2024-03-09 DIAGNOSIS — K219 Gastro-esophageal reflux disease without esophagitis: Secondary | ICD-10-CM | POA: Diagnosis not present

## 2024-03-13 DIAGNOSIS — N183 Chronic kidney disease, stage 3 unspecified: Secondary | ICD-10-CM | POA: Diagnosis not present

## 2024-03-13 DIAGNOSIS — E785 Hyperlipidemia, unspecified: Secondary | ICD-10-CM | POA: Diagnosis not present

## 2024-03-13 DIAGNOSIS — E1122 Type 2 diabetes mellitus with diabetic chronic kidney disease: Secondary | ICD-10-CM | POA: Diagnosis not present

## 2024-03-16 NOTE — Progress Notes (Signed)
 Remote Loop Recorder Transmission

## 2024-03-19 ENCOUNTER — Ambulatory Visit: Payer: Medicare HMO

## 2024-03-29 DIAGNOSIS — Z23 Encounter for immunization: Secondary | ICD-10-CM | POA: Diagnosis not present

## 2024-04-03 ENCOUNTER — Ambulatory Visit

## 2024-04-05 ENCOUNTER — Ambulatory Visit

## 2024-04-05 DIAGNOSIS — I63031 Cerebral infarction due to thrombosis of right carotid artery: Secondary | ICD-10-CM | POA: Diagnosis not present

## 2024-04-05 LAB — CUP PACEART REMOTE DEVICE CHECK
Date Time Interrogation Session: 20251022230946
Implantable Pulse Generator Implant Date: 20211217

## 2024-04-06 NOTE — Progress Notes (Signed)
 Remote Loop Recorder Transmission

## 2024-04-09 ENCOUNTER — Ambulatory Visit: Payer: Self-pay | Admitting: Cardiology

## 2024-04-10 NOTE — H&P (Signed)
 Patient's anticipated LOS is less than 2 midnights, meeting these requirements: - Younger than 65 - Lives within 1 hour of care - Has a competent adult at home to recover with post-op recover - NO history of  - Chronic pain requiring opiods  - Diabetes  - Coronary Artery Disease  - Heart failure  - Heart attack  - Stroke  - DVT/VTE  - Cardiac arrhythmia  - Respiratory Failure/COPD  - Renal failure  - Anemia  - Advanced Liver disease     [name] is an 83 y.o. female.    Chief Complaint: right knee pain  HPI: Pt is a 83 y.o. female complaining of right knee pain for multiple years. Pain had continually increased since the beginning. X-rays in the clinic show end-stage arthritic changes of the right knee. Pt has tried various conservative treatments which have failed to alleviate their symptoms, including injections and therapy. Various options are discussed with the patient. Risks, benefits and expectations were discussed with the patient. Patient understand the risks, benefits and expectations and wishes to proceed with surgery.   PCP:  Marvene Prentice SAUNDERS, FNP  D/C Plans: Home  PMH: Past Medical History:  Diagnosis Date   Blood transfusion without reported diagnosis    with hip fracture-83 years old   Cancer [facility]) 1983   Kidney Cancer   Diverticulosis    GERD (gastroesophageal reflux disease)    Personal history of kidney cancer     PSH: Past Surgical History:  Procedure Laterality Date   ABDOMINAL HYSTERECTOMY     COLON SURGERY     Severe sigmoid diverticulitis   COLONOSCOPY     FOOT SURGERY     HIP SURGERY     Kidney cancer     Kidney removed   LOOP RECORDER INSERTION N/A 05/30/2020   Procedure: LOOP RECORDER INSERTION;  Surgeon: Cindie Ole DASEN, MD;  Location: [facility] INVASIVE CV LAB;  Service: Cardiovascular;  Laterality: N/A;   POLYPECTOMY      Social History:  reports that she has never smoked. She has never used smokeless tobacco. She reports that  she does not drink alcohol and does not use drugs. BMI: Estimated body mass index is 23.41 kg/m as calculated from the following:   Height as of 03/06/24: 5' 4 (1.626 m).   Weight as of 03/06/24: 61.9 kg.  Lab Results  Component Value Date   ALBUMIN 4.2 05/19/2023   Diabetes: Patient does not have a diagnosis of diabetes.     Smoking Status:   reports that she has never smoked. She has never used smokeless tobacco.    Allergies:  Allergies  Allergen Reactions   Codeine Other (See Comments)    Pt unsure if really allergic to this    Medications: No current facility-administered medications for this encounter.   Current Outpatient Medications  Medication Sig Dispense Refill   acetaminophen  (TYLENOL ) 650 MG CR tablet Take 650 mg by mouth daily.     aspirin  EC 81 MG EC tablet Take 1 tablet (81 mg total) by mouth daily. Swallow whole. 30 tablet 0   atorvastatin  (LIPITOR) 80 MG tablet Take 1 tablet (80 mg total) by mouth daily. 90 tablet 3   cyclobenzaprine  (FLEXERIL ) 5 MG tablet TAKE ONE TABLET BY MOUTH THREE TIMES A DAY AS NEEDED FOR MUSCLE SPASMS 30 tablet 0   famotidine (PEPCID) 20 MG tablet Take 20 mg by mouth 2 (two) times daily.     furosemide  (LASIX ) 20 MG tablet Take  1 tablet (20 mg total) by mouth every other day. 45 tablet 3   MAGNESIUM  PO Take 400 mg by mouth every evening.     meclizine  (ANTIVERT ) 25 MG tablet as needed for dizziness.     meclizine  (ANTIVERT ) 25 MG tablet Take 1 tablet (25 mg total) by mouth 3 (three) times daily as needed for dizziness. (Patient not taking: Reported on 03/06/2024) 30 tablet 0   Multiple Vitamins-Minerals (MULTIVITAMIN WITH MINERALS) tablet Take 1 tablet by mouth daily.     pantoprazole  (PROTONIX ) 40 MG tablet Take 1 tablet (40 mg total) by mouth daily. 30 tablet 0   PARoxetine (PAXIL) 10 MG tablet Take 10 mg by mouth daily.     polyethylene glycol (MIRALAX  / GLYCOLAX ) 17 g packet Take 17 g by mouth daily. 30 each 0   senna-docusate  (SENOKOT-S) 8.6-50 MG tablet Take 1 tablet by mouth 2 (two) times daily. 30 tablet 0   vitamin C (ASCORBIC ACID) 500 MG tablet Take 500 mg by mouth daily.      No results found for this or any previous visit (from the past 48 hours). No results found.  ROS: Pain with rom of the right lower extremity  Physical Exam: Alert and oriented 83 y.o. female in no acute distress Cranial nerves 2-12 intact Cervical spine: full rom with no tenderness, nv intact distally Chest: active breath sounds bilaterally, no wheeze rhonchi or rales Heart: regular rate and rhythm, no murmur Abd: non tender non distended with active bowel sounds Hip is stable with rom  Right knee painful rom with crepitus Antalgic gait No rashes   Assessment/Plan Assessment: right knee end stage osteoarthritis  Plan:  Patient will undergo a right total knee by Dr. Kay at [place] Risks benefits and expectations were discussed with the patient. Patient understand risks, benefits and expectations and wishes to proceed. Preoperative templating of the joint replacement has been completed, documented, and submitted to the Operating Room personnel in order to optimize intra-operative equipment management.   Arvella Fireman PA-C, MPAS [facility] is now [employer] [address], [place], [place] [address] Phone: [phone] [email or web address] Facebook  [employer]

## 2024-04-17 NOTE — Patient Instructions (Signed)
 SURGICAL WAITING ROOM VISITATION Patients having surgery or a procedure may have no more than 2 support people in the waiting area - these visitors may rotate in the visitor waiting room.   Due to an increase in RSV and influenza rates and associated hospitalizations, children ages 43 and under may not visit patients in [facility] hospitals. If the patient needs to stay at the hospital during part of their recovery, the visitor guidelines for inpatient rooms apply.  PRE-OP VISITATION  Pre-op nurse will coordinate an appropriate time for 1 support person to accompany the patient in pre-op.  This support person may not rotate.  This visitor will be contacted when the time is appropriate for the visitor to come back in the pre-op area.  Please refer to the [facility] website for the visitor guidelines for Inpatients (after your surgery is over and you are in a regular room).  You are not required to quarantine at this time prior to your surgery. However, you must do this: Hand Hygiene often Do NOT share personal items Notify your provider if you are in close contact with someone who has COVID or you develop fever 100.4 or greater, new onset of sneezing, cough, sore throat, shortness of breath or body aches.  If you test positive for Covid or have been in contact with anyone that has tested positive in the last 10 days please notify you surgeon.    Your procedure is scheduled on:  04/27/24  Report to [facility] Main Entrance: [place] entrance where the [employer] is available.   Report to admitting at: 10:30 AM  Call this number if you have any questions or problems the morning of surgery [phone]  FOLLOW ANY ADDITIONAL PRE OP INSTRUCTIONS YOU RECEIVED FROM YOUR SURGEON'S OFFICE!!!  Do not eat food after Midnight the night prior to your surgery/procedure.  After Midnight you may have the following liquids until: 10:30 AM DAY OF SURGERY  Clear Liquid Diet Water Black  Coffee (sugar ok, NO MILK/CREAM OR CREAMERS)  Tea (sugar ok, NO MILK/CREAM OR CREAMERS) regular and decaf                             Plain Jell-O  with no fruit (NO RED)                                           Fruit ices (not with fruit pulp, NO RED)                                     Popsicles (NO RED)                                                                  Juice: NO CITRUS JUICES: only apple, WHITE grape, WHITE cranberry Sports drinks like Gatorade or Powerade (NO RED)   The day of surgery:  Drink ONE (1) Pre-Surgery Clear Ensure at : 10:00 AM the morning of surgery. Drink in one sitting. Do not sip.  This drink was given  to you during your hospital pre-op appointment visit. Nothing else to drink after completing the Pre-Surgery Clear Ensure or G2 : No candy, chewing gum or throat lozenges.    Oral Hygiene is also important to reduce your risk of infection.        Remember - BRUSH YOUR TEETH THE MORNING OF SURGERY WITH YOUR REGULAR TOOTHPASTE  Do NOT smoke after Midnight the night before surgery.  STOP TAKING all Vitamins, Herbs and supplements 1 week before your surgery.   Take ONLY these medicines the morning of surgery with A SIP OF WATER: famotidine.Tylenol  as needed.                   You may not have any metal on your body including hair pins, jewelry, and body piercing  Do not wear make-up, lotions, powders, perfumes / cologne, or deodorant  Do not wear nail polish including gel and S&S, artificial / acrylic nails, or any other type of covering on natural nails including finger and toenails. If you have artificial nails, gel coating, etc., that needs to be removed by a nail salon, Please have this removed prior to surgery. Not doing so may mean that your surgery could be cancelled or delayed if the Surgeon or anesthesia staff feels like they are unable to monitor you safely.   Do not shave 48 hours prior to surgery to avoid nicks in your skin which may contribute to  postoperative infections.   Contacts, Hearing Aids, dentures or bridgework may not be worn into surgery. DENTURES WILL BE REMOVED PRIOR TO SURGERY PLEASE DO NOT APPLY Poly grip OR ADHESIVES!!!  You may bring a small overnight bag with you on the day of surgery, only pack items that are not valuable. [place] IS NOT RESPONSIBLE   FOR VALUABLES THAT ARE LOST OR STOLEN.   Patients discharged on the day of surgery will not be allowed to drive home.  Someone NEEDS to stay with you for the first 24 hours after anesthesia.  Do not bring your home medications to the hospital. The Pharmacy will dispense medications listed on your medication list to you during your admission in the Hospital.  Special Instructions: Bring a copy of your healthcare power of attorney and living will documents the day of surgery, if you wish to have them scanned into your [place] [facility] Records- EPIC  Please read over the following fact sheets you were given: IF YOU HAVE QUESTIONS ABOUT YOUR PRE-OP INSTRUCTIONS, PLEASE CALL [phone]  PATIENT SIGNATURE_________________________________  NURSE SIGNATURE__________________________________  ________________________________________________________________________  Pre-operative 4 CHG Bath Instructions  DYNA-Hex 4 Chlorhexidine Gluconate 4% Solution Antiseptic 4 fl. oz   You can play a key role in reducing the risk of infection after surgery. Your skin needs to be as free of germs as possible. You can reduce the number of germs on your skin by washing with CHG (chlorhexidine gluconate) soap before surgery. CHG is an antiseptic soap that kills germs and continues to kill germs even after washing.   DO NOT use if you have an allergy to chlorhexidine/CHG or antibacterial soaps. If your skin becomes reddened or irritated, stop using the CHG and notify one of our RNs at   Please shower with the CHG soap starting 4 days before surgery using the following schedule:      Please keep in mind the following:  DO NOT shave, including legs and underarms, starting the day of your first shower.   You may shave your face at  any point before/day of surgery.  Place clean sheets on your bed the day you start using CHG soap. Use a clean washcloth (not used since being washed) for each shower. DO NOT sleep with pets once you start using the CHG.  CHG Shower Instructions:  If you choose to wash your hair and private area, wash first with your normal shampoo/soap.  After you use shampoo/soap, rinse your hair and body thoroughly to remove shampoo/soap residue.  Turn the water OFF and apply about 3 tablespoons (45 ml) of CHG soap to a CLEAN washcloth.  Apply CHG soap ONLY FROM YOUR NECK DOWN TO YOUR TOES (washing for 3-5 minutes)  DO NOT use CHG soap on face, private areas, open wounds, or sores.  Pay special attention to the area where your surgery is being performed.  If you are having back surgery, having someone wash your back for you may be helpful. Wait 2 minutes after CHG soap is applied, then you may rinse off the CHG soap.  Pat dry with a clean towel  Put on clean clothes/pajamas   If you choose to wear lotion, please use ONLY the CHG-compatible lotions on the back of this paper.     Additional instructions for the day of surgery: DO NOT APPLY any lotions, deodorants, cologne, or perfumes.   Put on clean/comfortable clothes.  Brush your teeth.  Ask your nurse before applying any prescription medications to the skin.   CHG Compatible Lotions   Aveeno Moisturizing lotion  Cetaphil Moisturizing Cream  Cetaphil Moisturizing Lotion  Clairol Herbal Essence Moisturizing Lotion, Dry Skin  Clairol Herbal Essence Moisturizing Lotion, Extra Dry Skin  Clairol Herbal Essence Moisturizing Lotion, Normal Skin  Curel Age Defying Therapeutic Moisturizing Lotion with Alpha Hydroxy  Curel Extreme Care Body Lotion  Curel Soothing Hands Moisturizing Hand Lotion   Curel Therapeutic Moisturizing Cream, Fragrance-Free  Curel Therapeutic Moisturizing Lotion, Fragrance-Free  Curel Therapeutic Moisturizing Lotion, Original Formula  Eucerin Daily Replenishing Lotion  Eucerin Dry Skin Therapy Plus Alpha Hydroxy Crme  Eucerin Dry Skin Therapy Plus Alpha Hydroxy Lotion  Eucerin Original Crme  Eucerin Original Lotion  Eucerin Plus Crme Eucerin Plus Lotion  Eucerin TriLipid Replenishing Lotion  Keri Anti-Bacterial Hand Lotion  Keri Deep Conditioning Original Lotion Dry Skin Formula Softly Scented  Keri Deep Conditioning Original Lotion, Fragrance Free Sensitive Skin Formula  Keri Lotion Fast Absorbing Fragrance Free Sensitive Skin Formula  Keri Lotion Fast Absorbing Softly Scented Dry Skin Formula  Keri Original Lotion  Keri Skin Renewal Lotion Keri Silky Smooth Lotion  Keri Silky Smooth Sensitive Skin Lotion  Nivea Body Creamy Conditioning Oil  Nivea Body Extra Enriched Lotion  Nivea Body Original Lotion  Nivea Body Sheer Moisturizing Lotion Nivea Crme  Nivea Skin Firming Lotion  NutraDerm 30 Skin Lotion  NutraDerm Skin Lotion  NutraDerm Therapeutic Skin Cream  NutraDerm Therapeutic Skin Lotion  ProShield Protective Hand Cream  Provon moisturizing lotion  Incentive Spirometer  An incentive spirometer is a tool that can help keep your lungs clear and active. This tool measures how well you are filling your lungs with each breath. Taking long deep breaths may help reverse or decrease the chance of developing breathing (pulmonary) problems (especially infection) following: A long period of time when you are unable to move or be active. BEFORE THE PROCEDURE  If the spirometer includes an indicator to show your best effort, your nurse or respiratory therapist will set it to a desired goal. If possible, sit up straight or lean  slightly forward. Try not to slouch. Hold the incentive spirometer in an upright position. INSTRUCTIONS FOR USE  Sit on the  edge of your bed if possible, or sit up as far as you can in bed or on a chair. Hold the incentive spirometer in an upright position. Breathe out normally. Place the mouthpiece in your mouth and seal your lips tightly around it. Breathe in slowly and as deeply as possible, raising the piston or the ball toward the top of the column. Hold your breath for 3-5 seconds or for as long as possible. Allow the piston or ball to fall to the bottom of the column. Remove the mouthpiece from your mouth and breathe out normally. Rest for a few seconds and repeat Steps 1 through 7 at least 10 times every 1-2 hours when you are awake. Take your time and take a few normal breaths between deep breaths. The spirometer may include an indicator to show your best effort. Use the indicator as a goal to work toward during each repetition. After each set of 10 deep breaths, practice coughing to be sure your lungs are clear. If you have an incision (the cut made at the time of surgery), support your incision when coughing by placing a pillow or rolled up towels firmly against it. Once you are able to get out of bed, walk around indoors and cough well. You may stop using the incentive spirometer when instructed by your caregiver.  RISKS AND COMPLICATIONS Take your time so you do not get dizzy or light-headed. If you are in pain, you may need to take or ask for pain medication before doing incentive spirometry. It is harder to take a deep breath if you are having pain. AFTER USE Rest and breathe slowly and easily. It can be helpful to keep track of a log of your progress. Your caregiver can provide you with a simple table to help with this. If you are using the spirometer at home, follow these instructions: SEEK MEDICAL CARE IF:  You are having difficultly using the spirometer. You have trouble using the spirometer as often as instructed. Your pain medication is not giving enough relief while using the spirometer. You  develop fever of 100.5 F (38.1 C) or higher. SEEK IMMEDIATE MEDICAL CARE IF:  You cough up bloody sputum that had not been present before. You develop fever of 102 F (38.9 C) or greater. You develop worsening pain at or near the incision site. MAKE SURE YOU:  Understand these instructions. Will watch your condition. Will get help right away if you are not doing well or get worse. Document Released: 10/11/2006 Document Revised: 08/23/2011 Document Reviewed: 12/12/2006 [facility] Patient Information 2014 [place], [place].   ________________________________________________________________________

## 2024-04-18 ENCOUNTER — Encounter (HOSPITAL_COMMUNITY)
Admission: RE | Admit: 2024-04-18 | Discharge: 2024-04-18 | Disposition: A | Discharge status: Home / Self Care | Source: Ambulatory Visit | Attending: Orthopedic Surgery | Admitting: Orthopedic Surgery

## 2024-04-18 ENCOUNTER — Encounter (HOSPITAL_COMMUNITY): Payer: Self-pay

## 2024-04-18 ENCOUNTER — Other Ambulatory Visit: Payer: Self-pay

## 2024-04-18 VITALS — BP 151/90 | HR 73 | Temp 97.7°F | Ht 64.0 in | Wt 137.0 lb

## 2024-04-18 DIAGNOSIS — Z8673 Personal history of transient ischemic attack (TIA), and cerebral infarction without residual deficits: Secondary | ICD-10-CM | POA: Diagnosis not present

## 2024-04-18 DIAGNOSIS — M1711 Unilateral primary osteoarthritis, right knee: Secondary | ICD-10-CM | POA: Diagnosis not present

## 2024-04-18 DIAGNOSIS — I639 Cerebral infarction, unspecified: Secondary | ICD-10-CM

## 2024-04-18 DIAGNOSIS — N189 Chronic kidney disease, unspecified: Secondary | ICD-10-CM | POA: Insufficient documentation

## 2024-04-18 DIAGNOSIS — Z01812 Encounter for preprocedural laboratory examination: Secondary | ICD-10-CM | POA: Diagnosis present

## 2024-04-18 DIAGNOSIS — Z85528 Personal history of other malignant neoplasm of kidney: Secondary | ICD-10-CM | POA: Insufficient documentation

## 2024-04-18 DIAGNOSIS — Z01818 Encounter for other preprocedural examination: Secondary | ICD-10-CM

## 2024-04-18 HISTORY — DX: Cerebral infarction, unspecified: I63.9

## 2024-04-18 HISTORY — DX: Chronic kidney disease, unspecified: N18.9

## 2024-04-18 HISTORY — DX: Anemia, unspecified: D64.9

## 2024-04-18 HISTORY — DX: Unspecified osteoarthritis, unspecified site: M19.90

## 2024-04-18 LAB — CBC
HCT: 41.3 % (ref 36.0–46.0)
Hemoglobin: 12.7 g/dL (ref 12.0–15.0)
MCH: 29.2 pg (ref 26.0–34.0)
MCHC: 30.8 g/dL (ref 30.0–36.0)
MCV: 94.9 fL (ref 80.0–100.0)
Platelets: 329 K/uL (ref 150–400)
RBC: 4.35 MIL/uL (ref 3.87–5.11)
RDW: 13.6 % (ref 11.5–15.5)
WBC: 6.8 K/uL (ref 4.0–10.5)
nRBC: 0 % (ref 0.0–0.2)

## 2024-04-18 LAB — SURGICAL PCR SCREEN
MRSA, PCR: NEGATIVE
Staphylococcus aureus: NEGATIVE

## 2024-04-18 LAB — BASIC METABOLIC PANEL WITH GFR
Anion gap: 8 (ref 5–15)
BUN: 11 mg/dL (ref 8–23)
CO2: 30 mmol/L (ref 22–32)
Calcium: 9.4 mg/dL (ref 8.9–10.3)
Chloride: 102 mmol/L (ref 98–111)
Creatinine, Ser: 0.75 mg/dL (ref 0.44–1.00)
GFR, Estimated: >60 mL/min (ref >60)
Glucose, Bld: 156 mg/dL — ABNORMAL HIGH (ref 70–99)
Potassium: 4.2 mmol/L (ref 3.5–5.1)
Sodium: 140 mmol/L (ref 135–145)

## 2024-04-18 NOTE — Progress Notes (Signed)
 For Anesthesia: PCP - Marvene Prentice SAUNDERS, FNP  Cardiologist - Santo [place]ly LABOR, MD  Clearance:Dick, Jackee VEAR Raddle., NP : 02/10/24 Bowel Prep reminder:N/A  Chest x-ray -  EKG - 05/03/23 Stress Test -  ECHO -  Cardiac Cath -  Pacemaker/ICD device last checked: Pacemaker orders received: Device Rep notified:  Spinal Cord Stimulator:  Sleep Study - N/A CPAP -   Fasting Blood Sugar - N/A Checks Blood Sugar _____ times a day Date and result of last Hgb A1c-  Last dose of GLP1 agonist- N/A GLP1 instructions: Hold 7 days prior to schedule (Hold 24 hours-daily)   Last dose of SGLT-2 inhibitors- N/A SGLT-2 instructions: Hold 72 hours prior to surgery  Blood Thinner Instructions:N/A Last Dose: Time last taken:  Aspirin  Instructions:Will be hold after: 04/20/24 Last Dose: Time last taken:  Activity level:  Unable to go up a flight of stairs due to knee pain.    Anesthesia review: Hx: CVA,Loop recorder. [facility] 04/[facility],they prescribed antibiotic because she is been coughing for several days.The pt. Does not know why they ordered antibiotics.  Patient denies shortness of breath, fever, cough and chest pain at PAT appointment   Patient verbalized understanding of instructions that were reviewed over the telephone.

## 2024-04-19 ENCOUNTER — Ambulatory Visit

## 2024-04-20 NOTE — Progress Notes (Signed)
 Anesthesia Chart Review   Case: [id number] Date/Time: 04/27/24 1325   Procedure: ARTHROPLASTY, KNEE, TOTAL (Right: Knee)   Anesthesia type: Choice   Diagnosis: Primary osteoarthritis of right knee [M17.11]   Pre-op diagnosis: Primary osteoarthritis of right knee   Location: [facility] ROOM 07 / [facility]   Surgeons: Kay Kemps, MD       DISCUSSION:83 y.o. never smoker with h/o stroke, CKD, kidney cancer, right knee OA scheduled for above procedure 04/27/2024 with Dr. Kemps Kay.   Per cardiology preoperative evaluation 02/10/2024, - Patient's RCRI score 6.6% The patient affirms she has been doing well without any new cardiac symptoms. They are able to achieve 5 METS without cardiac limitations. Therefore, based on ACC/AHA guidelines, the patient would be at acceptable risk for the planned procedure without further cardiovascular testing. The patient was advised that if she develops new symptoms prior to surgery to contact our office to arrange for a follow-up visit, and she verbalized understanding.  The patient was advised that if she develops new symptoms prior to surgery to contact our office to arrange for a follow-up visit, and she verbalized understanding. Patient can hold ASA 81 mg 7 days prior to procedure  VS: BP (!) 151/90   Pulse 73   Temp 36.5 C (Oral)   Ht 5' 4 (1.626 m)   Wt 62.1 kg   SpO2 100%   BMI 23.52 kg/m   PROVIDERS: Marvene Prentice SAUNDERS, FNP is PCP   Cardiologist - Santo [place] LABOR, MD  LABS: Labs reviewed: Acceptable for surgery. (all labs ordered are listed, but only abnormal results are displayed)  Labs Reviewed  BASIC METABOLIC PANEL WITH GFR - Abnormal; Notable for the following components:      Result Value   Glucose, Bld 156 (*)    All other components within normal limits  SURGICAL PCR SCREEN  CBC     IMAGES:   EKG:   CV: Echo 06/22/2023 1. Left ventricular ejection fraction, by estimation, is 60 to 65%. Left  ventricular ejection  fraction by 3D volume is 62 %. The left ventricle has  normal function. The left ventricle has no regional wall motion  abnormalities. Left ventricular diastolic   parameters were normal.   2. Right ventricular systolic function is normal. The right ventricular  size is normal. There is normal pulmonary artery systolic pressure. The  estimated right ventricular systolic pressure is 20.5 mmHg.   3. The mitral valve is normal in structure. Mild mitral valve  regurgitation. No evidence of mitral stenosis.   4. The aortic valve is tricuspid. Aortic valve regurgitation is not  visualized. No aortic stenosis is present.   5. The inferior vena cava is normal in size with greater than 50%  respiratory variability, suggesting right atrial pressure of 3 mmHg.  Past Medical History:  Diagnosis Date   Anemia    Arthritis    Blood transfusion without reported diagnosis    with hip fracture-83 years old   Cancer [facility]) 1983   Kidney Cancer   Chronic kidney disease    Diverticulosis    GERD (gastroesophageal reflux disease)    Personal history of kidney cancer    Stroke [facility])     Past Surgical History:  Procedure Laterality Date   ABDOMINAL HYSTERECTOMY     CATARACT EXTRACTION, BILATERAL Bilateral    COLON SURGERY     Severe sigmoid diverticulitis   COLONOSCOPY     FOOT SURGERY Left    HIP SURGERY Left  Kidney cancer Right    Kidney removed   LOOP RECORDER INSERTION N/A 05/30/2020   Procedure: LOOP RECORDER INSERTION;  Surgeon: Cindie Ole DASEN, MD;  [facility] INVASIVE CV LAB;  Service: Cardiovascular;  Laterality: N/A;   POLYPECTOMY      MEDICATIONS:  acetaminophen  (TYLENOL ) 650 MG CR tablet   aspirin  EC 81 MG EC tablet   atorvastatin  (LIPITOR) 80 MG tablet   cyclobenzaprine  (FLEXERIL ) 5 MG tablet   famotidine (PEPCID) 20 MG tablet   furosemide  (LASIX ) 20 MG tablet   magnesium  oxide (MAG-OX) 400 (240 Mg) MG tablet   Menthol -Methyl Salicylate (SALONPAS PAIN RELIEF PATCH EX)    Multiple Vitamins-Minerals (MULTIVITAMIN WITH MINERALS) tablet   polyethylene glycol (MIRALAX  / GLYCOLAX ) 17 g packet   senna-docusate (SENOKOT-S) 8.6-50 MG tablet   vitamin C (ASCORBIC ACID) 500 MG tablet   No current facility-administered medications for this encounter.     Harlene Hoots Ward, PA-C [facility] Pre-Surgical Testing [phone]

## 2024-04-20 NOTE — Anesthesia Preprocedure Evaluation (Addendum)
 Anesthesia Evaluation  Patient identified by MRN, date of birth, ID band Patient awake    Reviewed: Allergy & Precautions, H&P , NPO status , Patient's Chart, lab work & pertinent test results  Airway Mallampati: II  TM Distance: >3 FB Neck ROM: Full    Dental no notable dental hx.    Pulmonary neg pulmonary ROS   Pulmonary exam normal breath sounds clear to auscultation       Cardiovascular (-) angina (-) Past MI negative cardio ROS Normal cardiovascular exam Rhythm:Regular Rate:Normal     Neuro/Psych neg Seizures CVA, Residual Symptoms  negative psych ROS   GI/Hepatic Neg liver ROS,GERD  ,,  Endo/Other  negative endocrine ROS    Renal/GU negative Renal ROShistory of kidney cancer   negative genitourinary   Musculoskeletal  (+) Arthritis ,    Abdominal   Peds negative pediatric ROS (+)  Hematology negative hematology ROS (+)   Anesthesia Other Findings   Reproductive/Obstetrics negative OB ROS                              Anesthesia Physical Anesthesia Plan  ASA: 3  Anesthesia Plan: Spinal and Regional   Post-op Pain Management: Regional block*   Induction:   PONV Risk Score and Plan: 2 and Treatment may vary due to age or medical condition  Airway Management Planned: Natural Airway  Additional Equipment:   Intra-op Plan:   Post-operative Plan:   Informed Consent: I have reviewed the patients History and Physical, chart, labs and discussed the procedure including the risks, benefits and alternatives for the proposed anesthesia with the patient or authorized representative who has indicated his/her understanding and acceptance.     Dental advisory given  Plan Discussed with: CRNA  Anesthesia Plan Comments: (See PAT note 04/18/2024  \83 y.o. never smoker with h/o stroke, CKD, kidney cancer, right knee OA scheduled for above procedure 04/27/2024 with Dr. Marcey Her.     Per cardiology preoperative evaluation 02/10/2024, - Patient's RCRI score 6.6% The patient affirms she has been doing well without any new cardiac symptoms. They are able to achieve 5 METS without cardiac limitations. Therefore, based on ACC/AHA guidelines, the patient would be at acceptable risk for the planned procedure without further cardiovascular testing. The patient was advised that if she develops new symptoms prior to surgery to contact our office to arrange for a follow-up visit, and she verbalized understanding.  The patient was advised that if she develops new symptoms prior to surgery to contact our office to arrange for a follow-up visit, and she verbalized understanding. Patient can hold ASA 81 mg 7 days prior to procedure )         Anesthesia Quick Evaluation

## 2024-04-27 ENCOUNTER — Ambulatory Visit (HOSPITAL_COMMUNITY): Payer: Self-pay

## 2024-04-27 ENCOUNTER — Encounter (HOSPITAL_COMMUNITY): Payer: Self-pay | Admitting: Orthopedic Surgery

## 2024-04-27 ENCOUNTER — Encounter (HOSPITAL_COMMUNITY)
Admission: RE | Disposition: A | Discharge status: Home / Self Care | Payer: Self-pay | Source: Ambulatory Visit | Attending: Orthopedic Surgery

## 2024-04-27 ENCOUNTER — Observation Stay (HOSPITAL_COMMUNITY)
Admission: RE | Admit: 2024-04-27 | Discharge: 2024-04-29 | Disposition: A | Discharge status: Home / Self Care | Source: Ambulatory Visit | Attending: Orthopedic Surgery | Admitting: Orthopedic Surgery

## 2024-04-27 ENCOUNTER — Other Ambulatory Visit: Payer: Self-pay

## 2024-04-27 ENCOUNTER — Ambulatory Visit (HOSPITAL_COMMUNITY): Payer: Self-pay | Admitting: Physician Assistant

## 2024-04-27 DIAGNOSIS — Z85528 Personal history of other malignant neoplasm of kidney: Secondary | ICD-10-CM | POA: Insufficient documentation

## 2024-04-27 DIAGNOSIS — Z8673 Personal history of transient ischemic attack (TIA), and cerebral infarction without residual deficits: Secondary | ICD-10-CM | POA: Diagnosis not present

## 2024-04-27 DIAGNOSIS — N189 Chronic kidney disease, unspecified: Secondary | ICD-10-CM | POA: Insufficient documentation

## 2024-04-27 DIAGNOSIS — E785 Hyperlipidemia, unspecified: Secondary | ICD-10-CM | POA: Diagnosis not present

## 2024-04-27 DIAGNOSIS — M25561 Pain in right knee: Secondary | ICD-10-CM | POA: Diagnosis present

## 2024-04-27 DIAGNOSIS — Z96651 Presence of right artificial knee joint: Principal | ICD-10-CM

## 2024-04-27 DIAGNOSIS — I639 Cerebral infarction, unspecified: Secondary | ICD-10-CM | POA: Diagnosis not present

## 2024-04-27 DIAGNOSIS — Z7982 Long term (current) use of aspirin: Secondary | ICD-10-CM | POA: Diagnosis not present

## 2024-04-27 DIAGNOSIS — M1711 Unilateral primary osteoarthritis, right knee: Principal | ICD-10-CM | POA: Insufficient documentation

## 2024-04-27 HISTORY — PX: TOTAL KNEE ARTHROPLASTY: SHX125

## 2024-04-27 SURGERY — ARTHROPLASTY, KNEE, TOTAL
Anesthesia: Regional | Site: Knee | Laterality: Right

## 2024-04-27 MED ORDER — PROPOFOL 500 MG/50ML IV EMUL
INTRAVENOUS | Status: DC | PRN
  Administered 2024-04-27: 75 ug/kg/min via INTRAVENOUS

## 2024-04-27 MED ORDER — BUPIVACAINE-EPINEPHRINE (PF) 0.25% -1:200000 IJ SOLN
INTRAMUSCULAR | Status: AC
  Filled 2024-04-27: qty 30

## 2024-04-27 MED ORDER — PROPOFOL 1000 MG/100ML IV EMUL
INTRAVENOUS | Status: AC
  Filled 2024-04-27: qty 100

## 2024-04-27 MED ORDER — DEXAMETHASONE SOD PHOSPHATE PF 10 MG/ML IJ SOLN
INTRAMUSCULAR | Status: DC | PRN
  Administered 2024-04-27: 4 mg via INTRAVENOUS

## 2024-04-27 MED ORDER — TRAMADOL HCL 50 MG PO TABS: 50.0000 mg | ORAL_TABLET | Freq: Four times a day (QID) | ORAL | 0 refills | Status: AC | PRN

## 2024-04-27 MED ORDER — MAGNESIUM OXIDE -MG SUPPLEMENT 400 (240 MG) MG PO TABS
400.0000 mg | ORAL_TABLET | Freq: Every evening | ORAL | Status: DC
  Administered 2024-04-27 – 2024-04-28 (×2): 400 mg via ORAL
  Filled 2024-04-27 (×2): qty 1

## 2024-04-27 MED ORDER — OXYCODONE HCL 5 MG PO TABS: 5.0000 mg | ORAL_TABLET | Freq: Once | ORAL | Status: DC | PRN

## 2024-04-27 MED ORDER — METOCLOPRAMIDE HCL 5 MG/ML IJ SOLN
5.0000 mg | Freq: Three times a day (TID) | INTRAMUSCULAR | Status: DC | PRN
Start: 2024-04-27 — End: 2024-04-29

## 2024-04-27 MED ORDER — FUROSEMIDE 20 MG PO TABS
20.0000 mg | ORAL_TABLET | ORAL | Status: DC
  Administered 2024-04-27: 20 mg via ORAL
  Filled 2024-04-27 (×2): qty 1

## 2024-04-27 MED ORDER — ADULT MULTIVITAMIN W/MINERALS CH
1.0000 | ORAL_TABLET | Freq: Every day | ORAL | Status: DC
  Administered 2024-04-28 – 2024-04-29 (×2): 1 via ORAL
  Filled 2024-04-27 (×2): qty 1

## 2024-04-27 MED ORDER — MUSCLE RUB 10-15 % EX CREA: 1.0000 | TOPICAL_CREAM | Freq: Every day | CUTANEOUS | Status: DC | PRN

## 2024-04-27 MED ORDER — FENTANYL CITRATE (PF) 50 MCG/ML IJ SOSY
50.0000 ug | PREFILLED_SYRINGE | Freq: Once | INTRAMUSCULAR | Status: AC
  Administered 2024-04-27: 50 ug via INTRAVENOUS
  Filled 2024-04-27: qty 2

## 2024-04-27 MED ORDER — TRANEXAMIC ACID-NACL 1000-0.7 MG/100ML-% IV SOLN
1000.0000 mg | Freq: Once | INTRAVENOUS | Status: AC
  Administered 2024-04-27: 1000 mg via INTRAVENOUS
  Filled 2024-04-27: qty 100

## 2024-04-27 MED ORDER — PROPOFOL 10 MG/ML IV BOLUS
INTRAVENOUS | Status: AC
  Filled 2024-04-27: qty 20

## 2024-04-27 MED ORDER — BUPIVACAINE LIPOSOME 1.3 % IJ SUSP
INTRAMUSCULAR | Status: AC
  Filled 2024-04-27: qty 20

## 2024-04-27 MED ORDER — ACETAMINOPHEN ER 650 MG PO TBCR: 650.0000 mg | EXTENDED_RELEASE_TABLET | Freq: Three times a day (TID) | ORAL | Status: DC | PRN

## 2024-04-27 MED ORDER — CYCLOBENZAPRINE HCL 5 MG PO TABS: ORAL_TABLET | ORAL | 0 refills | Status: AC

## 2024-04-27 MED ORDER — PHENOL 1.4 % MT LIQD
1.0000 | OROMUCOSAL | Status: DC | PRN
Start: 2024-04-27 — End: 2024-04-29

## 2024-04-27 MED ORDER — OXYCODONE HCL 5 MG/5ML PO SOLN: 5.0000 mg | Freq: Once | ORAL | Status: DC | PRN

## 2024-04-27 MED ORDER — LIDOCAINE HCL (CARDIAC) PF 100 MG/5ML IV SOSY
PREFILLED_SYRINGE | INTRAVENOUS | Status: DC | PRN
  Administered 2024-04-27: 40 mg via INTRAVENOUS

## 2024-04-27 MED ORDER — BUPIVACAINE LIPOSOME 1.3 % IJ SUSP: 20.0000 mL | Freq: Once | INTRAMUSCULAR | Status: AC

## 2024-04-27 MED ORDER — TRANEXAMIC ACID-NACL 1000-0.7 MG/100ML-% IV SOLN
1000.0000 mg | INTRAVENOUS | Status: AC
  Administered 2024-04-27: 1000 mg via INTRAVENOUS
  Filled 2024-04-27: qty 100

## 2024-04-27 MED ORDER — CHLORHEXIDINE GLUCONATE 0.12 % MT SOLN
15.0000 mL | Freq: Once | OROMUCOSAL | Status: AC
  Administered 2024-04-27: 15 mL via OROMUCOSAL

## 2024-04-27 MED ORDER — CEFAZOLIN SODIUM-DEXTROSE 2-4 GM/100ML-% IV SOLN
2.0000 g | Freq: Four times a day (QID) | INTRAVENOUS | Status: AC
  Administered 2024-04-27 – 2024-04-28 (×2): 2 g via INTRAVENOUS
  Filled 2024-04-27 (×2): qty 100

## 2024-04-27 MED ORDER — PROPOFOL 10 MG/ML IV BOLUS
INTRAVENOUS | Status: DC | PRN
  Administered 2024-04-27: 20 mg via INTRAVENOUS

## 2024-04-27 MED ORDER — SENNOSIDES-DOCUSATE SODIUM 8.6-50 MG PO TABS
1.0000 | ORAL_TABLET | Freq: Two times a day (BID) | ORAL | Status: DC
  Administered 2024-04-27 – 2024-04-29 (×4): 1 via ORAL
  Filled 2024-04-27 (×4): qty 1

## 2024-04-27 MED ORDER — VITAMIN C 500 MG PO TABS
500.0000 mg | ORAL_TABLET | Freq: Every day | ORAL | Status: DC
  Administered 2024-04-28 – 2024-04-29 (×2): 500 mg via ORAL
  Filled 2024-04-27 (×2): qty 1

## 2024-04-27 MED ORDER — FENTANYL CITRATE (PF) 50 MCG/ML IJ SOSY: 25.0000 ug | PREFILLED_SYRINGE | INTRAMUSCULAR | Status: DC | PRN

## 2024-04-27 MED ORDER — SODIUM CHLORIDE (PF) 0.9 % IJ SOLN
INTRAMUSCULAR | Status: AC
  Filled 2024-04-27: qty 50

## 2024-04-27 MED ORDER — ONDANSETRON HCL 4 MG/2ML IJ SOLN: 4.0000 mg | Freq: Four times a day (QID) | INTRAMUSCULAR | Status: DC | PRN

## 2024-04-27 MED ORDER — SODIUM CHLORIDE 0.9 % IV SOLN: INTRAVENOUS | Status: AC

## 2024-04-27 MED ORDER — ACETAMINOPHEN 325 MG PO TABS
325.0000 mg | ORAL_TABLET | Freq: Four times a day (QID) | ORAL | Status: DC | PRN
  Administered 2024-04-28 – 2024-04-29 (×2): 650 mg via ORAL
  Filled 2024-04-27 (×2): qty 2

## 2024-04-27 MED ORDER — MIDAZOLAM HCL (PF) 2 MG/2ML IJ SOLN
1.0000 mg | Freq: Once | INTRAMUSCULAR | Status: AC
  Filled 2024-04-27: qty 2

## 2024-04-27 MED ORDER — ACETAMINOPHEN 10 MG/ML IV SOLN: 1000.0000 mg | Freq: Once | INTRAVENOUS | Status: DC | PRN

## 2024-04-27 MED ORDER — HYDROCODONE-ACETAMINOPHEN 7.5-325 MG PO TABS
1.0000 | ORAL_TABLET | ORAL | Status: DC | PRN
  Administered 2024-04-27 – 2024-04-29 (×8): 1 via ORAL
  Filled 2024-04-27 (×8): qty 1

## 2024-04-27 MED ORDER — DOCUSATE SODIUM 100 MG PO CAPS
100.0000 mg | ORAL_CAPSULE | Freq: Two times a day (BID) | ORAL | Status: DC
  Administered 2024-04-27 – 2024-04-29 (×4): 100 mg via ORAL
  Filled 2024-04-27 (×4): qty 1

## 2024-04-27 MED ORDER — ONDANSETRON HCL 4 MG/2ML IJ SOLN
INTRAMUSCULAR | Status: DC | PRN
Start: 2024-04-27 — End: 2024-04-27
  Administered 2024-04-27: 4 mg via INTRAVENOUS

## 2024-04-27 MED ORDER — POVIDONE-IODINE 10 % EX SWAB: 2.0000 | Freq: Once | CUTANEOUS | Status: DC

## 2024-04-27 MED ORDER — POLYETHYLENE GLYCOL 3350 17 G PO PACK
17.0000 g | PACK | Freq: Every day | ORAL | Status: DC
  Administered 2024-04-28 – 2024-04-29 (×2): 17 g via ORAL
  Filled 2024-04-27 (×2): qty 1

## 2024-04-27 MED ORDER — ORAL CARE MOUTH RINSE: 15.0000 mL | Freq: Once | OROMUCOSAL | Status: AC

## 2024-04-27 MED ORDER — ROPIVACAINE HCL 5 MG/ML IJ SOLN
INTRAMUSCULAR | Status: DC | PRN
  Administered 2024-04-27: 20 mL via PERINEURAL

## 2024-04-27 MED ORDER — FAMOTIDINE 20 MG PO TABS
20.0000 mg | ORAL_TABLET | Freq: Every day | ORAL | Status: DC
  Administered 2024-04-28 – 2024-04-29 (×2): 20 mg via ORAL
  Filled 2024-04-27 (×2): qty 1

## 2024-04-27 MED ORDER — ASPIRIN 81 MG PO TBEC: 81.0000 mg | DELAYED_RELEASE_TABLET | Freq: Two times a day (BID) | ORAL | 0 refills | Status: AC

## 2024-04-27 MED ORDER — METHOCARBAMOL 500 MG PO TABS
ORAL_TABLET | ORAL | Status: AC
  Filled 2024-04-27: qty 1

## 2024-04-27 MED ORDER — SODIUM CHLORIDE 0.9 % IR SOLN
Status: DC | PRN
  Administered 2024-04-27: 1000 mL

## 2024-04-27 MED ORDER — SODIUM CHLORIDE 0.9 % IV SOLN
INTRAVENOUS | Status: DC | PRN
  Administered 2024-04-27: 80 mL

## 2024-04-27 MED ORDER — CEFAZOLIN SODIUM-DEXTROSE 2-4 GM/100ML-% IV SOLN
2.0000 g | INTRAVENOUS | Status: AC
  Administered 2024-04-27: 2 g via INTRAVENOUS
  Filled 2024-04-27: qty 100

## 2024-04-27 MED ORDER — ONDANSETRON HCL 4 MG PO TABS
4.0000 mg | ORAL_TABLET | Freq: Four times a day (QID) | ORAL | Status: DC | PRN
  Administered 2024-04-28: 4 mg via ORAL
  Filled 2024-04-27: qty 1

## 2024-04-27 MED ORDER — MENTHOL 3 MG MT LOZG: 1.0000 | LOZENGE | OROMUCOSAL | Status: DC | PRN

## 2024-04-27 MED ORDER — BUPIVACAINE IN DEXTROSE 0.75-8.25 % IT SOLN
INTRATHECAL | Status: DC | PRN
  Administered 2024-04-27: 1.6 mL via INTRATHECAL

## 2024-04-27 MED ORDER — 0.9 % SODIUM CHLORIDE (POUR BTL) OPTIME
TOPICAL | Status: DC | PRN
  Administered 2024-04-27: 1000 mL

## 2024-04-27 MED ORDER — METHOCARBAMOL 500 MG PO TABS
500.0000 mg | ORAL_TABLET | Freq: Four times a day (QID) | ORAL | Status: DC | PRN
  Administered 2024-04-27 – 2024-04-28 (×4): 500 mg via ORAL
  Filled 2024-04-27 (×4): qty 1

## 2024-04-27 MED ORDER — ASPIRIN 81 MG PO CHEW
81.0000 mg | CHEWABLE_TABLET | Freq: Two times a day (BID) | ORAL | Status: DC
  Administered 2024-04-27 – 2024-04-29 (×4): 81 mg via ORAL
  Filled 2024-04-27 (×4): qty 1

## 2024-04-27 MED ORDER — METHOCARBAMOL 1000 MG/10ML IJ SOLN: 500.0000 mg | Freq: Four times a day (QID) | INTRAMUSCULAR | Status: DC | PRN

## 2024-04-27 MED ORDER — TRAMADOL HCL 50 MG PO TABS
50.0000 mg | ORAL_TABLET | Freq: Four times a day (QID) | ORAL | Status: DC | PRN
  Administered 2024-04-28 – 2024-04-29 (×3): 50 mg via ORAL
  Filled 2024-04-27 (×3): qty 1

## 2024-04-27 MED ORDER — ONDANSETRON HCL 4 MG/2ML IJ SOLN
INTRAMUSCULAR | Status: AC
  Filled 2024-04-27: qty 2

## 2024-04-27 MED ORDER — MORPHINE SULFATE (PF) 2 MG/ML IV SOLN
0.5000 mg | INTRAVENOUS | Status: DC | PRN
  Administered 2024-04-28: 1 mg via INTRAVENOUS
  Filled 2024-04-27: qty 1

## 2024-04-27 MED ORDER — LACTATED RINGERS IV SOLN: INTRAVENOUS | Status: DC

## 2024-04-27 MED ORDER — DROPERIDOL 2.5 MG/ML IJ SOLN: 0.6250 mg | Freq: Once | INTRAMUSCULAR | Status: DC | PRN

## 2024-04-27 MED ORDER — METOCLOPRAMIDE HCL 5 MG PO TABS
5.0000 mg | ORAL_TABLET | Freq: Three times a day (TID) | ORAL | Status: DC | PRN
Start: 2024-04-27 — End: 2024-04-29

## 2024-04-27 MED ORDER — ATORVASTATIN CALCIUM 40 MG PO TABS
80.0000 mg | ORAL_TABLET | Freq: Every day | ORAL | Status: DC
  Administered 2024-04-28 – 2024-04-29 (×2): 80 mg via ORAL
  Filled 2024-04-27 (×2): qty 2

## 2024-04-27 SURGICAL SUPPLY — 45 items
ATTUNE PS FEM RT SZ 6 CEM KNEE (Femur) IMPLANT
ATTUNE PSRP INSR SZ6 6 KNEE (Insert) IMPLANT
BAG COUNTER SPONGE SURGICOUNT (BAG) IMPLANT
BAG ZIPLOCK 12X15 (MISCELLANEOUS) ×2 IMPLANT
BASE TIBIAL ROT PLAT SZ 5 KNEE (Knees) IMPLANT
BLADE SAG 18X100X1.27 (BLADE) ×2 IMPLANT
BLADE SAW SGTL 13X75X1.27 (BLADE) ×2 IMPLANT
BNDG ELASTIC 6X10 VLCR STRL LF (GAUZE/BANDAGES/DRESSINGS) ×2 IMPLANT
BNDG GAUZE DERMACEA FLUFF 4 (GAUZE/BANDAGES/DRESSINGS) ×2 IMPLANT
BOWL SMART MIX CTS (DISPOSABLE) ×2 IMPLANT
CEMENT HV SMART SET (Cement) ×4 IMPLANT
COVER SURGICAL LIGHT HANDLE (MISCELLANEOUS) ×2 IMPLANT
CUFF TRNQT CYL 24X4X16.5-23 (TOURNIQUET CUFF) IMPLANT
CUFF TRNQT CYL 34X4.125X (TOURNIQUET CUFF) ×2 IMPLANT
DRAPE SHEET LG 3/4 BI-LAMINATE (DRAPES) ×2 IMPLANT
DRAPE U-SHAPE 47X51 STRL (DRAPES) ×2 IMPLANT
DRSG ADAPTIC 3X8 NADH LF (GAUZE/BANDAGES/DRESSINGS) ×2 IMPLANT
DURAPREP 26ML APPLICATOR (WOUND CARE) ×2 IMPLANT
ELECT PENCIL ROCKER SW 15FT (MISCELLANEOUS) ×2 IMPLANT
ELECT REM PT RETURN 15FT ADLT (MISCELLANEOUS) ×2 IMPLANT
GAUZE PAD ABD 8X10 STRL (GAUZE/BANDAGES/DRESSINGS) ×2 IMPLANT
GAUZE SPONGE 4X4 12PLY STRL (GAUZE/BANDAGES/DRESSINGS) ×2 IMPLANT
GLOVE BIOGEL PI IND STRL 7.5 (GLOVE) ×2 IMPLANT
GLOVE BIOGEL PI IND STRL 8.5 (GLOVE) ×2 IMPLANT
GLOVE ORTHO TXT STRL SZ7.5 (GLOVE) ×2 IMPLANT
GLOVE SURG ORTHO 8.5 STRL (GLOVE) ×2 IMPLANT
GOWN STRL REUS W/ TWL XL LVL3 (GOWN DISPOSABLE) ×4 IMPLANT
IMMOBILIZER KNEE 20 THIGH 36 (SOFTGOODS) ×2 IMPLANT
KIT TURNOVER KIT A (KITS) ×2 IMPLANT
MANIFOLD NEPTUNE II (INSTRUMENTS) ×2 IMPLANT
NS IRRIG 1000ML POUR BTL (IV SOLUTION) ×2 IMPLANT
PACK TOTAL KNEE CUSTOM (KITS) ×2 IMPLANT
PATELLA MEDIAL ATTUN 35MM KNEE (Knees) IMPLANT
PIN STEINMAN FIXATION KNEE (PIN) IMPLANT
PROTECTOR NERVE ULNAR (MISCELLANEOUS) ×2 IMPLANT
SET HNDPC FAN SPRY TIP SCT (DISPOSABLE) ×2 IMPLANT
STRIP CLOSURE SKIN 1/2X4 (GAUZE/BANDAGES/DRESSINGS) ×4 IMPLANT
SUT MNCRL AB 3-0 PS2 18 (SUTURE) ×2 IMPLANT
SUT VIC AB 0 CT1 36 (SUTURE) ×2 IMPLANT
SUT VIC AB 1 CT1 36 (SUTURE) ×4 IMPLANT
SUT VIC AB 2-0 CT1 TAPERPNT 27 (SUTURE) ×2 IMPLANT
TOWEL GREEN STERILE FF (TOWEL DISPOSABLE) ×2 IMPLANT
TRAY CATH INTERMITTENT SS 16FR (CATHETERS) ×2 IMPLANT
WATER STERILE IRR 1000ML POUR (IV SOLUTION) ×4 IMPLANT
YANKAUER SUCT BULB TIP NO VENT (SUCTIONS) ×2 IMPLANT

## 2024-04-27 NOTE — Anesthesia Procedure Notes (Signed)
 Anesthesia Regional Block: Adductor canal block   Pre-Anesthetic Checklist: , timeout performed,  Correct Patient, Correct Site, Correct Laterality,  Correct Procedure, Correct Position, site marked,  Risks and benefits discussed,  Surgical consent,  Pre-op evaluation,  At surgeon's request and post-op pain management  Laterality: Right  Prep: chloraprep       Needles:  Injection technique: Single-shot  Needle Type: Echogenic Stimulator Needle     Needle Length: 9cm  Needle Gauge: 21     Additional Needles:   Procedures:,,,, ultrasound used (permanent image in chart),,    Narrative:  Start time: 04/27/2024 12:45 PM End time: 04/27/2024 12:50 PM Injection made incrementally with aspirations every 5 mL.  Performed by: Personally  Anesthesiologist: Erma Thom SAUNDERS, MD  Additional Notes: Discussed risks and benefits of the nerve block in detail, including but not limited vascular injury, permanent nerve damage and infection.   Patient tolerated the procedure well. Local anesthetic introduced in an incremental fashion under minimal resistance after negative aspirations. No paresthesias were elicited. After completion of the procedure, no acute issues were identified and patient continued to be monitored by RN.

## 2024-04-27 NOTE — Interval H&P Note (Signed)
 History and Physical Interval Note:  04/27/2024 1:02 PM  [name]  has presented today for surgery, with the diagnosis of Primary osteoarthritis of right knee.  The various methods of treatment have been discussed with the patient and family. After consideration of risks, benefits and other options for treatment, the patient has consented to  Procedure(s): ARTHROPLASTY, KNEE, TOTAL (Right) as a surgical intervention.  The patient's history has been reviewed, patient examined, no change in status, stable for surgery.  I have reviewed the patient's chart and labs.  Questions were answered to the patient's satisfaction.     Elspeth JONELLE Her

## 2024-04-27 NOTE — Progress Notes (Signed)
 Orthopedic Tech Progress Note Patient Details:  [name] 1941-03-20 [id number] CPM will be removed at 2030 CPM Right Knee CPM Right Knee: On Right Knee Flexion (Degrees): 90 Right Knee Extension (Degrees): 0  Post Interventions Patient Tolerated: Well Ortho Devices Type of Ortho Device: Bone foam zero knee Ortho Device/Splint Location: Right knee Ortho Device/Splint Interventions: Application   Post Interventions Patient Tolerated: Well  Massie BRAVO Tauna Macfarlane 04/27/2024, 4:41 PM

## 2024-04-27 NOTE — Brief Op Note (Signed)
 04/27/2024  3:28 PM  PATIENT:  [name]  83 y.o. female  PRE-OPERATIVE DIAGNOSIS:  Primary osteoarthritis of right knee, end stage  POST-OPERATIVE DIAGNOSIS:  Primary osteoarthritis of right knee, end stage  PROCEDURE:  Procedure(s): ARTHROPLASTY, KNEE, TOTAL (Right) DePuy Attune  SURGEON:  Surgeons and Role:    DEWAINE Kay Kemps, MD - Primary  PHYSICIAN ASSISTANT:   ASSISTANTS: Debby KATHEE Fireman, PA-C   ANESTHESIA:   regional and spinal  EBL:  50 mL   BLOOD ADMINISTERED:none  DRAINS: none   LOCAL MEDICATIONS USED:  MARCAINE      SPECIMEN:  No Specimen  DISPOSITION OF SPECIMEN:  N/A  COUNTS:  YES  TOURNIQUET:   Total Tourniquet Time Documented: Thigh (Right) - 90 minutes Total: Thigh (Right) - 90 minutes   DICTATION: .Other Dictation: Dictation Number [id number]  PLAN OF CARE: Admit for overnight observation  PATIENT DISPOSITION:  PACU - hemodynamically stable.   Delay start of Pharmacological VTE agent (>24hrs) due to surgical blood loss or risk of bleeding: no

## 2024-04-27 NOTE — Op Note (Signed)
 NAME: [name], [name]. MEDICAL RECORD NO: [id number] ACCOUNT NO: [id number] DATE OF BIRTH: 07-Oct-1940 FACILITY: [facility] LOCATION: [id number] PHYSICIAN: Elspeth SAUNDERS. Kay, MD  Operative Report   DATE OF PROCEDURE: 04/27/2024   PREOPERATIVE DIAGNOSIS: Right knee end-stage osteoarthritis.  POSTOPERATIVE DIAGNOSIS: Right knee end-stage osteoarthritis.  PROCEDURE PERFORMED: Right total knee arthroplasty using DePuy Attune prosthesis.  ATTENDING SURGEON: Elspeth SAUNDERS. Kay, MD  ASSISTANT: Debby Crock [place], [place], who was scrubbed during the entire procedure, and necessary for satisfactory completion of surgery.  ANESTHESIA: Spinal anesthesia plus adductor canal block.  ESTIMATED BLOOD LOSS: Minimal.  FLUID REPLACEMENT: 1000 mL crystalloid.  COUNTS: Instrument count was correct.  COMPLICATIONS: No complications.  ANTIBIOTICS: Perioperative antibiotics were given.  INDICATIONS: The patient is an 83 year old woman who presents with a history of worsening right knee pain and valgus deformity secondary to end-stage arthritis, bone-on-bone. The patient has failed years of conservative management and presents for  operative treatment in order to eliminate pain and to restore function to her knee. Informed consent obtained.  DESCRIPTION OF PROCEDURE: After an adequate level of spinal anesthesia was achieved plus adductor canal block, the patient was positioned supine on the operating room table. The right leg was correctly identified and a nonsterile tourniquet was placed on  the proximal thigh. Sterile prep and drape of the right knee performed. Timeout was called verifying correct patient and correct site. We elevated the leg and exsanguinated with an Esmarch bandage, inflated the tourniquet to 250 mmHg. We then placed the  knee in flexion and performed a longitudinal midline incision with a 10-blade scalpel. We used a fresh #10 blade scalpel to do the medial parapatellar arthrotomy. We then divided  lateral patellofemoral ligaments everting the patella and exposing the  distal femur, which was devoid of cartilage. We entered the distal femur with a step-cut drill. We irrigated the canal. We then introduced an intramedullary guide and resected initially 11 mm off the distal femur set on 3 degrees of valgus for this  valgus knee with a flexion contracture. Once we had our distal cut performed, we sized our femur to a size 6 anterior down. We performed anterior, posterior, and chamfer cuts with a 4-in-1 block. We then removed ACL and PCL and the meniscal tissues. We  subluxed the tibia anteriorly and performed our tibial cut using the external jig, removing 2 mm off the affected side. There was more wear on the medial side, so we set the caliper on the 2 mm on that medial side. We did a perpendicular cut with minimal  posterior slope to this posterior cruciate-substituting prosthesis. We then went ahead and used the lamina spreader. We removed posterior femoral condyle osteophytes and released the posterior capsule. We injected the posterior capsule with a  combination of Marcaine , Exparel  and saline. We then checked our gaps, which unfortunately there was a difference and we had a good gap at 5-6 mm in flexion; however, the patient was tight in extension. Despite releasing the medial side at the joint line  and around the back and also the posterior capsule, we still felt too tight. Thus, we decided to go ahead and cut 2 more mm off the distal femur. We used the [facility] wing and reset our distal femoral cutting guide and resected 2 more mm off the distal  femur. We then had to recut our chamfer cuts with a 4-in-1 block, which we also used the [facility] wing for that. Once we had our cuts revised, our gaps were symmetric  at 6 mm. We then went ahead and went to the tibia. We drilled with a modular drill and  keel punch for the 5 tibia. We then cut our box for the 6 right femur and then once we had that done, we  trialed with the size 6, 6 mm poly. We were able to get the knee into extension with good stability and also had good flexion stability. Next, we  resurfaced our patella going from a 25-mm thickness down to a 15-mm thickness and drilling lug holes for the 35 patellar button. Once we had that done, we went ahead and ranged the knee and had excellent patellar tracking with no-touch technique. We then  removed all trial components. We vacuum-mixed high viscosity cement on the back table. We pulse irrigated the bone, dried it very well, and then cemented the components all in one step including tibia, femur, and patella. We placed the knee in extension  with a size 6, 6 mm poly and kept the knee in extension for compression while the cement set up and also used a patellar clamp to compress the patellar component while everything set up. Once cement was hard on the back table, we removed excess cement  with a quarter inch curved osteotome. I injected the anterior capsule with a combination of Marcaine , Exparel  and saline. We then removed the trial size 6, 6 mm poly and selected that as our real poly. We checked the back of the knee to make sure we had  no additional cement in the back of the knee. We irrigated well and then we placed our size 6, 6 mm poly on the tibial tray. We reduced the knee, had a nice little pop as the medial condyle reduced. Excellent range of motion. Nice stability. Excellent  patellar tracking. No touch technique. We then irrigated again and closed the parapatellar arthrotomy with #1 Vicryl suture followed by #0 and #2-0 Vicryl layered subcutaneous closure and a running #4-0 Monocryl for skin. Steri-Strips were applied  followed by a sterile dressing. The patient tolerated the surgery well.    SUJ D: 04/27/2024 3:39:18 pm T: 04/27/2024 8:40:00 pm  JOB: [id number]/ [id number]

## 2024-04-27 NOTE — Anesthesia Procedure Notes (Signed)
 Spinal  Patient location during procedure: OR Start time: 04/27/2024 1:15 PM End time: 04/27/2024 1:20 PM Reason for block: surgical anesthesia Staffing Performed: anesthesiologist  Anesthesiologist: Erma Thom SAUNDERS, MD Performed by: Erma Thom SAUNDERS, MD Authorized by: Erma Thom SAUNDERS, MD   Preanesthetic Checklist Completed: patient identified, IV checked, site marked, risks and benefits discussed, surgical consent, monitors and equipment checked, pre-op evaluation and timeout performed Spinal Block Patient position: sitting Prep: DuraPrep Patient monitoring: heart rate, cardiac monitor, continuous pulse ox and blood pressure Approach: midline Location: L3-4 Needle Needle type: Pencan  Needle gauge: 24 G Assessment Sensory level: T6 Additional Notes Functioning IV was confirmed and monitors were applied. Sterile prep and drape, including hand hygiene and sterile gloves were used. The patient was positioned and the spine was prepped. The skin was anesthetized with lidocaine .  Free flow of clear CSF was obtained prior to injecting local anesthetic into the CSF.  The spinal needle aspirated freely following injection.  The needle was carefully withdrawn.  The patient tolerated the procedure well.

## 2024-04-27 NOTE — Anesthesia Procedure Notes (Signed)
 Procedure Name: MAC Date/Time: 04/27/2024 1:23 PM  Performed by: Metta Andrea NOVAK, CRNAPre-anesthesia Checklist: Patient identified, Emergency Drugs available, Suction available, Patient being monitored and Timeout performed Oxygen Delivery Method: Simple face mask Placement Confirmation: positive ETCO2

## 2024-04-27 NOTE — Interval H&P Note (Signed)
 History and Physical Interval Note:  04/27/2024 1:20 PM  [name]  has presented today for surgery, with the diagnosis of Primary osteoarthritis of right knee.  The various methods of treatment have been discussed with the patient and family. After consideration of risks, benefits and other options for treatment, the patient has consented to  Procedure(s): ARTHROPLASTY, KNEE, TOTAL (Right) as a surgical intervention.  The patient's history has been reviewed, patient examined, no change in status, stable for surgery.  I have reviewed the patient's chart and labs.  Questions were answered to the patient's satisfaction.     Elspeth JONELLE Her

## 2024-04-27 NOTE — Transfer of Care (Signed)
 Immediate Anesthesia Transfer of Care Note  Patient: [name]  Procedure(s) Performed: ARTHROPLASTY, KNEE, TOTAL (Right: Knee)  Patient Location: PACU  Anesthesia Type:MAC combined with regional for post-op pain  Level of Consciousness: awake, alert , and oriented  Airway & Oxygen Therapy: Patient Spontanous Breathing and Patient connected to face mask oxygen  Post-op Assessment: Report given to RN and Post -op Vital signs reviewed and stable  Post vital signs: Reviewed and stable  Last Vitals:  Vitals Value Taken Time  BP    Temp    Pulse    Resp    SpO2      Last Pain:  Vitals:   04/27/24 1049  TempSrc: Oral         Complications: No notable events documented.

## 2024-04-27 NOTE — Discharge Instructions (Signed)
 Ice to the knee constantly.  Keep the incision covered and clean and dry for one week, then ok to remove your bandage and leave incision open to air.   Do exercise as instructed every hour, please to prevent stiffness.    DO NOT prop anything under the knee, it will make your knee stiff.  Prop under the ankle to encourage your knee to go straight.   Use the walker while you are up and around for balance.  Wear your support stockings 24/7 to prevent blood clots and take baby aspirin  twice daily for 30 days also to prevent blood clots  Follow up with Dr Kay in two weeks in the office, call [phone] for appt  Please call Dr Kay (cell) [phone] with any questions or concerns    INSTRUCTIONS AFTER JOINT REPLACEMENT   Remove items at home which could result in a fall. This includes throw rugs or furniture in walking pathways ICE to the affected joint every three hours while awake for 30 minutes at a time, for at least the first 3-5 days, and then as needed for pain and swelling.  Continue to use ice for pain and swelling. You may notice swelling that will progress down to the foot and ankle.  This is normal after surgery.  Elevate your leg when you are not up walking on it.   Continue to use the breathing machine you got in the hospital (incentive spirometer) which will help keep your temperature down.  It is common for your temperature to cycle up and down following surgery, especially at night when you are not up moving around and exerting yourself.  The breathing machine keeps your lungs expanded and your temperature down.   DIET:  As you were doing prior to hospitalization, we recommend a well-balanced diet.  DRESSING / WOUND CARE / SHOWERING  Please leave the Aquacel gel bandage in place for a full week. OK to shower with that bandage on. May remove that bandage after one week and leave the incision open to air.   ACTIVITY  Increase activity slowly as tolerated, but follow  the weight bearing instructions below.   No driving for 6 weeks or until further direction given by your physician.  You cannot drive while taking narcotics.  No lifting or carrying greater than 10 lbs. until further directed by your surgeon. Avoid periods of inactivity such as sitting longer than an hour when not asleep. This helps prevent blood clots.  You may return to work once you are authorized by your doctor.     WEIGHT BEARING   Weight bearing as tolerated with assist device (walker, cane, etc) as directed, use it as long as suggested by your surgeon or therapist, typically at least 4-6 weeks.   EXERCISES  Results after joint replacement surgery are often greatly improved when you follow the exercise, range of motion and muscle strengthening exercises prescribed by your doctor. Safety measures are also important to protect the joint from further injury. Any time any of these exercises cause you to have increased pain or swelling, decrease what you are doing until you are comfortable again and then slowly increase them. If you have problems or questions, call your caregiver or physical therapist for advice.   Rehabilitation is important following a joint replacement. After just a few days of immobilization, the muscles of the leg can become weakened and shrink (atrophy).  These exercises are designed to build up the tone and strength of the  thigh and leg muscles and to improve motion. Often times heat used for twenty to thirty minutes before working out will loosen up your tissues and help with improving the range of motion but do not use heat for the first two weeks following surgery (sometimes heat can increase post-operative swelling).   These exercises can be done on a training (exercise) mat, on the floor, on a table or on a bed. Use whatever works the best and is most comfortable for you.    Use music or television while you are exercising so that the exercises are a pleasant break in  your day. This will make your life better with the exercises acting as a break in your routine that you can look forward to.   Perform all exercises about fifteen times, three times per day or as directed.  You should exercise both the operative leg and the other leg as well.  Exercises include:   Quad Sets - Tighten up the muscle on the front of the thigh (Quad) and hold for 5-10 seconds.   Straight Leg Raises - With your knee straight (if you were given a brace, keep it on), lift the leg to 60 degrees, hold for 3 seconds, and slowly lower the leg.  Perform this exercise against resistance later as your leg gets stronger.  Leg Slides: Lying on your back, slowly slide your foot toward your buttocks, bending your knee up off the floor (only go as far as is comfortable). Then slowly slide your foot back down until your leg is flat on the floor again.  Angel Wings: Lying on your back spread your legs to the side as far apart as you can without causing discomfort.  Hamstring Strength:  Lying on your back, push your heel against the floor with your leg straight by tightening up the muscles of your buttocks.  Repeat, but this time bend your knee to a comfortable angle, and push your heel against the floor.  You may put a pillow under the heel to make it more comfortable if necessary.   A rehabilitation program following joint replacement surgery can speed recovery and prevent re-injury in the future due to weakened muscles. Contact your doctor or a physical therapist for more information on knee rehabilitation.    CONSTIPATION  Constipation is defined medically as fewer than three stools per week and severe constipation as less than one stool per week.  Even if you have a regular bowel pattern at home, your normal regimen is likely to be disrupted due to multiple reasons following surgery.  Combination of anesthesia, postoperative narcotics, change in appetite and fluid intake all can affect your bowels.    YOU MUST use at least one of the following options; they are listed in order of increasing strength to get the job done.  They are all available over the counter, and you may need to use some, POSSIBLY even all of these options:    Drink plenty of fluids (prune juice may be helpful) and high fiber foods Colace 100 mg by mouth twice a day  Senokot for constipation as directed and as needed Dulcolax (bisacodyl ), take with full glass of water  Miralax  (polyethylene glycol) once or twice a day as needed.  If you have tried all these things and are unable to have a bowel movement in the first 3-4 days after surgery call either your surgeon or your primary doctor.    If you experience loose stools or diarrhea, hold the  medications until you stool forms back up.  If your symptoms do not get better within 1 week or if they get worse, check with your doctor.  If you experience the worst abdominal pain ever or develop nausea or vomiting, please contact the office immediately for further recommendations for treatment.   ITCHING:  If you experience itching with your medications, try taking only a single pain pill, or even half a pain pill at a time.  You can also use Benadryl  over the counter for itching or also to help with sleep.   TED HOSE STOCKINGS:  Use stockings on both legs until for at least 2 weeks or as directed by physician office. They may be removed at night for sleeping.  MEDICATIONS:  See your medication summary on the "After Visit Summary" that nursing will review with you.  You may have some home medications which will be placed on hold until you complete the course of blood thinner medication.  It is important for you to complete the blood thinner medication as prescribed.  PRECAUTIONS:  If you experience chest pain or shortness of breath - call 911 immediately for transfer to the hospital emergency department.   If you develop a fever greater that 101 F, purulent drainage from wound,  increased redness or drainage from wound, foul odor from the wound/dressing, or calf pain - CONTACT YOUR SURGEON.                                                   FOLLOW-UP APPOINTMENTS:  If you do not already have a post-op appointment, please call the office for an appointment to be seen by your surgeon.  Guidelines for how soon to be seen are listed in your "After Visit Summary", but are typically between 1-4 weeks after surgery.  OTHER INSTRUCTIONS:   Knee Replacement:  Do not place pillow under knee, focus on keeping the knee straight while resting. CPM instructions: 0-90 degrees, 2 hours in the morning, 2 hours in the afternoon, and 2 hours in the evening. Place foam block, curve side up under heel at all times except when in CPM or when walking.  DO NOT modify, tear, cut, or change the foam block in any way.  POST-OPERATIVE OPIOID TAPER INSTRUCTIONS: It is important to wean off of your opioid medication as soon as possible. If you do not need pain medication after your surgery it is ok to stop day one. Opioids include: Codeine, Hydrocodone(Norco, Vicodin), Oxycodone(Percocet, oxycontin) and hydromorphone amongst others.  Long term and even short term use of opiods can cause: Increased pain response Dependence Constipation Depression Respiratory depression And more.  Withdrawal symptoms can include Flu like symptoms Nausea, vomiting And more Techniques to manage these symptoms Hydrate well Eat regular healthy meals Stay active Use relaxation techniques(deep breathing, meditating, yoga) Do Not substitute Alcohol to help with tapering If you have been on opioids for less than two weeks and do not have pain than it is ok to stop all together.  Plan to wean off of opioids This plan should start within one week post op of your joint replacement. Maintain the same interval or time between taking each dose and first decrease the dose.  Cut the total daily intake of opioids by one  tablet each day Next start to increase the time between doses. The last  dose that should be eliminated is the evening dose.   MAKE SURE YOU:  Understand these instructions.  Get help right away if you are not doing well or get worse.    Thank you for letting us  be a part of your medical care team.  It is a privilege we respect greatly.  We hope these instructions will help you stay on track for a fast and full recovery!

## 2024-04-28 DIAGNOSIS — M1711 Unilateral primary osteoarthritis, right knee: Secondary | ICD-10-CM | POA: Diagnosis not present

## 2024-04-28 NOTE — Plan of Care (Signed)

## 2024-04-28 NOTE — Progress Notes (Signed)
 Orthopedic Tech Progress Note Patient Details:  [name] [birth year] [id number]  Ortho Devices Type of Ortho Device: Bone foam zero knee Ortho Device/Splint Location: Riught knee Ortho Device/Splint Interventions: Ordered, Application, Adjustment   Post Interventions Patient Tolerated: Well  [name] 04/28/2024, 10:18 AM

## 2024-04-28 NOTE — Evaluation (Signed)
 Physical Therapy Evaluation Patient Details Name: [name] MRN: [id number] DOB: 1940/11/04 Today's Date: 04/28/2024  History of Present Illness  83 yo female s/p R TKA 04/27/24. Hx kidney Ca (remote)  Clinical Impression  On eval, pt required Min A for mobility. She ambulated ~10 feet with a RW. Pt c/o 10 pain level upon my arrival-was in bone foam. RN arrived to administer pain meds. Pt agreeable to attempt mobility after receiving pain meds. She reported pain as 5/10 once out of bone foam and after mobilizing a bit in the room. Will plan to have a 2nd session to continue progression of therapy. Pt is concerned about being ready to d/c home on today.         If plan is discharge home, recommend the following: A little help with walking and/or transfers;A little help with bathing/dressing/bathroom;Assistance with cooking/housework;Assist for transportation;Help with stairs or ramp for entrance   Can travel by private vehicle        Equipment Recommendations None recommended by PT  Recommendations for Other Services       Functional Status Assessment Patient has had a recent decline in their functional status and demonstrates the ability to make significant improvements in function in a reasonable and predictable amount of time.     Precautions / Restrictions Precautions Precautions: Fall Restrictions Weight Bearing Restrictions Per Provider Order: No RLE Weight Bearing Per Provider Order: Weight bearing as tolerated      Mobility  Bed Mobility Overal bed mobility: Needs Assistance Bed Mobility: Supine to Sit     Supine to sit: Min assist, HOB elevated, Used rails     General bed mobility comments: Cues for technqiue, safety. Assist for R LE.    Transfers Overall transfer level: Needs assistance Equipment used: Rolling walker (2 wheels) Transfers: Sit to/from Stand, Bed to chair/wheelchair/BSC Sit to Stand: Min assist, From elevated surface   Step pivot  transfers: Min assist       General transfer comment: Cues for safety,technique, hand/LE placement. Assist to rise, steady, control descent.    Ambulation/Gait Ambulation/Gait assistance: Min assist Gait Distance (Feet): 10 Feet Assistive device: Rolling walker (2 wheels) Gait Pattern/deviations: Step-to pattern       General Gait Details: Cues for safety,technqiue, sequencing. Assist to steady pt throughout distance. Pt denied lightheadedness, reported improvement in pain level.  Stairs            Wheelchair Mobility     Tilt Bed    Modified Rankin (Stroke Patients Only)       Balance Overall balance assessment: Needs assistance         Standing balance support: Bilateral upper extremity supported, During functional activity, Reliant on assistive device for balance Standing balance-Leahy Scale: Poor                               Pertinent Vitals/Pain Pain Assessment Pain Assessment: 0-10 Pain Score: 5  Pain Location: 10 with LE in bone foam; 5/10 remainder of session Pain Descriptors / Indicators: Aching, Grimacing, Operative site guarding Pain Intervention(s): Limited activity within patient's tolerance, Monitored during session, Ice applied, Repositioned    Home Living Family/patient expects to be discharged to:: Private residence Living Arrangements: Spouse/significant other Available Help at Discharge: Family Type of Home: House Home Access: Stairs to enter Entrance Stairs-Rails: None Entrance Stairs-Number of Steps: 1   Home Layout: One level Home Equipment: Agricultural Consultant (2 wheels);Cane - single point  Additional Comments: husband cannot really provide any physical assistance    Prior Function Prior Level of Function : Independent/Modified Independent             Mobility Comments: uses cane for ambulation ADLs Comments: mod ind bathing, dressing     Extremity/Trunk Assessment   Upper Extremity Assessment Upper  Extremity Assessment: Generalized weakness    Lower Extremity Assessment Lower Extremity Assessment: Generalized weakness    Cervical / Trunk Assessment Cervical / Trunk Assessment: Normal  Communication   Communication Communication: No apparent difficulties    Cognition Arousal: Alert Behavior During Therapy: WFL for tasks assessed/performed   PT - Cognitive impairments: No apparent impairments                         Following commands: Intact       Cueing Cueing Techniques: Verbal cues     General Comments      Exercises     Assessment/Plan    PT Assessment Patient needs continued PT services  PT Problem List Decreased strength;Decreased activity tolerance;Decreased range of motion;Decreased balance;Decreased mobility;Decreased knowledge of use of DME;Pain       PT Treatment Interventions DME instruction;Gait training;Stair training;Functional mobility training;Therapeutic activities;Therapeutic exercise;Patient/family education;Balance training    PT Goals (Current goals can be found in the Care Plan section)  Acute Rehab PT Goals Patient Stated Goal: home. less pain. PT Goal Formulation: With patient Time For Goal Achievement: 05/12/24 Potential to Achieve Goals: Good    Frequency 7X/week     Co-evaluation               AM-PAC PT 6 Clicks Mobility  Outcome Measure Help needed turning from your back to your side while in a flat bed without using bedrails?: A Little Help needed moving from lying on your back to sitting on the side of a flat bed without using bedrails?: A Little Help needed moving to and from a bed to a chair (including a wheelchair)?: A Little Help needed standing up from a chair using your arms (e.g., wheelchair or bedside chair)?: A Little Help needed to walk in hospital room?: A Little Help needed climbing 3-5 steps with a railing? : A Little 6 Click Score: 18    End of Session Equipment Utilized During  Treatment: Gait belt Activity Tolerance: Patient tolerated treatment well;Patient limited by pain Patient left: in chair;with call bell/phone within reach   PT Visit Diagnosis: Other abnormalities of gait and mobility (R26.89);Pain;Unsteadiness on feet (R26.81) Pain - Right/Left: Right Pain - part of body: Knee;Leg    Time: 8974-8947 PT Time Calculation (min) (ACUTE ONLY): 27 min   Charges:   PT Evaluation $PT Eval Low Complexity: 1 Low PT Treatments $Gait Training: 8-22 mins PT General Charges $$ ACUTE PT VISIT: 1 Visit           Dannial SQUIBB, PT Acute Rehabilitation  Office: [phone]

## 2024-04-28 NOTE — Care Management Obs Status (Signed)
 MEDICARE OBSERVATION STATUS NOTIFICATION   Patient Details  Name: [name] MRN: [id number] Date of Birth: 04/21/1941   Medicare Observation Status Notification Given:  Yes    Sonda Manuella Quill, RN 04/28/2024, 4:01 PM

## 2024-04-28 NOTE — Progress Notes (Signed)
 Subjective: 1 Day Post-Op Procedure(s) (LRB): ARTHROPLASTY, KNEE, TOTAL (Right)  Patient reports pain as moderate.  Denies fever, chills, N/V, CP, SOB.  Tolerating POs well.  Notes concerns about going home.    Objective:   VITALS:  Temp:  [97.8 F (36.6 C)-99.1 F (37.3 C)] 98.1 F (36.7 C) (11/15 0520) Pulse Rate:  [56-77] 58 (11/15 0520) Resp:  [10-18] 17 (11/15 0520) BP: (118-175)/(52-98) 118/54 (11/15 0520) SpO2:  [93 %-100 %] 95 % (11/15 0520) Weight:  [62.1 kg] 62.1 kg (11/14 1032)  General: WDWN patient in NAD. Psych:  Appropriate mood and affect. Neuro:  A&O x 3, Moving all extremities, sensation intact to light touch HEENT:  EOMs intact Chest:  Even non-labored respirations Skin:  Incision C/D/I, no rashes or lesions Extremities: warm/dry, mild edema to R knee, no erythema or echymosis.  No lymphadenopathy. Pulses: Popliteus 2+ MSK:  ROM: lacks 5 degrees TKE, MMT: able to perform quad set, (-) Homan's    LABS No results for input(s): HGB, WBC, PLT in the last 72 hours. No results for input(s): NA, K, CL, CO2, BUN, CREATININE, GLUCOSE in the last 72 hours. No results for input(s): LABPT, INR in the last 72 hours.   Assessment/Plan: 1 Day Post-Op Procedure(s) (LRB): ARTHROPLASTY, KNEE, TOTAL (Right)  Patient seen in rounds for Dr. Kay SHERRYLL SAUNDERS LE with walker/cane Up with therapy DVT ppx: ASA Disp: Home once cleared by therapy If patient is ready to go later today let me know and I'll place D/C order.  May need to stay one more night.  Eva Barrack PA-C [facility] Office:  [phone]

## 2024-04-28 NOTE — Progress Notes (Signed)
 Physical Therapy Treatment Patient Details Name: [name] MRN: [id number] DOB: 11-09-1940 Today's Date: 04/28/2024   History of Present Illness 83 yo female s/p R TKA 04/27/24. Hx kidney Ca (remote)    PT Comments  Reviewed exercises, gait training, and stair step training. Pt reports pain as controlled during session. She tolerated activity well. Towards end of session, pt reported mild lightheadedness and nausea. She then vomited once seated at EOB. Discussed d/c-explained to pt that she has met her PT goals however she should ensure that she feels well enough to go home on today. Also explained that she could stay overnight if she felt more comfortable with that. Made RN aware about events and that d/c was still undecided at the moment. Encouraged pt to speak with RN as well. Plan is for HHPT f/u. If pt remains in hospital will see on tomorrow.    If plan is discharge home, recommend the following: A little help with walking and/or transfers;A little help with bathing/dressing/bathroom;Assistance with cooking/housework;Assist for transportation;Help with stairs or ramp for entrance   Can travel by private vehicle        Equipment Recommendations  None recommended by PT    Recommendations for Other Services       Precautions / Restrictions Precautions Precautions: Fall Restrictions Weight Bearing Restrictions Per Provider Order: No RLE Weight Bearing Per Provider Order: Weight bearing as tolerated     Mobility  Bed Mobility Overal bed mobility: Needs Assistance Bed Mobility: Sit to Supine     Supine to sit: Min assist, HOB elevated, Used rails Sit to supine: Contact guard assist   General bed mobility comments: Cues for technqiue, safety. Pt practiced using gait belt as leg lifter    Transfers Overall transfer level: Needs assistance Equipment used: Rolling walker (2 wheels) Transfers: Sit to/from Stand Sit to Stand: Contact guard assist   Step pivot transfers:  Min assist       General transfer comment: x2 (once from recliner, once from bsc placed over toilet). Cues for safety, technique, hand/LE placement.    Ambulation/Gait Ambulation/Gait assistance: Contact guard assist Gait Distance (Feet): 75 Feet Assistive device: Rolling walker (2 wheels) Gait Pattern/deviations: Step-to pattern       General Gait Details: Cues for safety,technqiue, sequencing. Slow, steady gait. Pt denied dizziness while ambulating in hallway. Tolerated distance well.   Stairs Stairs: Yes Stairs assistance: Min assist Stair Management: Step to pattern, Forwards, With walker Number of Stairs: 1 General stair comments: Cues for safety, technique, sequencing. Slight steadying assist provided.   Wheelchair Mobility     Tilt Bed    Modified Rankin (Stroke Patients Only)       Balance Overall balance assessment: Needs assistance         Standing balance support: Bilateral upper extremity supported, During functional activity, Reliant on assistive device for balance Standing balance-Leahy Scale: Poor                              Communication Communication Communication: No apparent difficulties  Cognition Arousal: Alert Behavior During Therapy: WFL for tasks assessed/performed   PT - Cognitive impairments: No apparent impairments                         Following commands: Intact      Cueing Cueing Techniques: Verbal cues  Exercises Total Joint Exercises Ankle Circles/Pumps: AROM, Both, 10 reps Quad Sets: AROM,  Right, 10 reps Straight Leg Raises: AAROM, Right, 10 reps Knee Flexion: AROM, Right, 10 reps, Seated Goniometric ROM: ~10-65 degrees    General Comments        Pertinent Vitals/Pain Pain Assessment Pain Assessment: 0-10 Pain Score: 5  Pain Location: R knee Pain Descriptors / Indicators: Aching, Operative site guarding Pain Intervention(s): Limited activity within patient's tolerance, Monitored  during session, Repositioned    Home Living                          Prior Function            PT Goals (current goals can now be found in the care plan section) Acute Rehab PT Goals Patient Stated Goal: home. less pain. PT Goal Formulation: With patient Time For Goal Achievement: 05/12/24 Potential to Achieve Goals: Good Progress towards PT goals: Progressing toward goals    Frequency    7X/week      PT Plan      Co-evaluation              AM-PAC PT 6 Clicks Mobility   Outcome Measure  Help needed turning from your back to your side while in a flat bed without using bedrails?: A Little Help needed moving from lying on your back to sitting on the side of a flat bed without using bedrails?: A Little Help needed moving to and from a bed to a chair (including a wheelchair)?: A Little Help needed standing up from a chair using your arms (e.g., wheelchair or bedside chair)?: A Little Help needed to walk in hospital room?: A Little Help needed climbing 3-5 steps with a railing? : A Little 6 Click Score: 18    End of Session Equipment Utilized During Treatment: Gait belt Activity Tolerance: Patient tolerated treatment well Patient left: in bed;with call bell/phone within reach   PT Visit Diagnosis: Other abnormalities of gait and mobility (R26.89);Pain;Unsteadiness on feet (R26.81) Pain - Right/Left: Right Pain - part of body: Knee     Time: 1410-1452 PT Time Calculation (min) (ACUTE ONLY): 42 min  Charges:    $Gait Training: 23-37 mins $Therapeutic Exercise: 8-22 mins PT General Charges $$ ACUTE PT VISIT: 1 Visit                        Dannial SQUIBB, PT Acute Rehabilitation  Office: [phone]

## 2024-04-28 NOTE — TOC Initial Note (Signed)
 Transition of Care [facility]) - Initial/Assessment Note    Patient Details  Name: [name] MRN: [id number] Date of Birth: 1940/06/15  Transition of Care [facility]) CM/SW Contact:    Sonda Manuella Quill, RN Phone Number: 04/28/2024, 4:15 PM  Clinical Narrative:                 IP CM consulted for d/c planning; spoke w/ pt in room; pt said she lives at home w/ her spouse; she plans to return at d/c w/ family support; pt identified POC [name] (dtr) [phone]; she will provide transportation; pt verified insurance/PCP; she denied SDOH risks; pt has cane, crutches, walker, BSC, shower chair; HHPT w/ [facility],and; Private Duty w/ [facility]; pt said she would like to con't services w/ [facility]; spoke w/ Artavia at [facility]; she said pt is active w/ agency; IP CM is following.  Expected Discharge Plan: Home w Home Health Services Barriers to Discharge: Continued Medical Work up   Patient Goals and CMS Choice Patient states their goals for this hospitalization and ongoing recovery are:: home          Expected Discharge Plan and Services   Discharge Planning Services: CM Consult   Living arrangements for the past 2 months: Single Family Home                 DME Arranged: N/A DME Agency: NA       HH Arranged: NA HH Agency: NA        Prior Living Arrangements/Services Living arrangements for the past 2 months: Single Family Home Lives with:: Spouse Patient language and need for interpreter reviewed:: Yes Do you feel safe going back to the place where you live?: Yes      Need for Family Participation in Patient Care: Yes (Comment) Care giver support system in place?: Yes (comment) Current home services: DME, Home PT (cane, crutches, walker, BSC, shower chair; HHPT w/ [facility]; Private Duty w/ [facility]) Criminal Activity/Legal Involvement Pertinent to Current Situation/Hospitalization: No - Comment as needed  Activities of Daily Living   ADL Screening (condition at  time of admission) Independently performs ADLs?: Yes (appropriate for developmental age) Is the patient deaf or have difficulty hearing?: No Does the patient have difficulty seeing, even when wearing glasses/contacts?: No Does the patient have difficulty concentrating, remembering, or making decisions?: No  Permission Sought/Granted Permission sought to share information with : Case Manager Permission granted to share information with : Yes, Verbal Permission Granted  Share Information with NAME: Case Manager     Permission granted to share info w Relationship: [name] (dtr) [phone]     Emotional Assessment Appearance:: Appears stated age Attitude/Demeanor/Rapport: Gracious Affect (typically observed): Accepting Orientation: : Oriented to Self, Oriented to Place, Oriented to  Time, Oriented to Situation Alcohol / Substance Use: Not Applicable Psych Involvement: No (comment)  Admission diagnosis:  Primary osteoarthritis of right knee [M17.11] Status post total knee replacement, 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] Patient Active Problem List   Diagnosis Date Noted   Status post total knee replacement, 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   History of stroke 04/09/2022   Hyperlipidemia LDL goal <70 04/09/2022   Implantable loop recorder present 04/09/2022   Epiretinal membrane, right eye 04/30/2021   Gait disturbance, post-stroke 06/20/2020   Chronic neck pain 06/06/2020   Constipation 06/06/2020   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   Stroke (cerebrum) (HCC) 05/31/2020   Cerebral ischemia 05/27/2020   Left leg weakness 05/27/2020   Triquetral fracture 05/27/2020   Acute CVA (cerebrovascular  accident) (HCC) 05/26/2020   Left leg numbness 05/26/2020   GERD (gastroesophageal reflux disease) 05/26/2020   Benign neoplasm of choroid, 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   Posterior vitreous detachment of both eyes 04/29/2020   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   Left epiretinal membrane 04/29/2020   Pain in right knee 05/26/2018   Bilateral lower abdominal pain 10/21/2010   History of diverticulitis of colon 10/21/2010   History of Clostridium difficile infection 10/21/2010   PCP:  Marvene Prentice SAUNDERS, FNP Pharmacy:   [facility] [id number] - [place], [place] - [address] [place]  [address] Phone: [phone] Fax: [phone]     Social Drivers of Health (SDOH) Social History: SDOH Screenings   Food Insecurity: No Food Insecurity (04/28/2024)  Housing: Low Risk  (04/28/2024)  Transportation Needs: No Transportation Needs (04/28/2024)  Utilities: Not At Risk (04/28/2024)  Depression (PHQ2-9): Low Risk  (02/26/2021)  Social Connections: Unknown (04/27/2024)  Tobacco Use: Low Risk  (04/27/2024)   SDOH Interventions: Food Insecurity Interventions: Intervention Not Indicated, Inpatient TOC Housing Interventions: Intervention Not Indicated, Inpatient TOC Transportation Interventions: Intervention Not Indicated, Inpatient TOC Utilities Interventions: Intervention Not Indicated, Inpatient TOC   Readmission Risk Interventions     No data to display

## 2024-04-29 DIAGNOSIS — M1711 Unilateral primary osteoarthritis, right knee: Secondary | ICD-10-CM | POA: Diagnosis not present

## 2024-04-29 MED ORDER — KETOROLAC TROMETHAMINE 15 MG/ML IJ SOLN
7.5000 mg | Freq: Four times a day (QID) | INTRAMUSCULAR | Status: DC
  Administered 2024-04-29: 7.5 mg via INTRAVENOUS
  Filled 2024-04-29: qty 1

## 2024-04-29 MED ORDER — ONDANSETRON HCL 4 MG PO TABS: 4.0000 mg | ORAL_TABLET | Freq: Four times a day (QID) | ORAL | 0 refills | Status: AC | PRN

## 2024-04-29 NOTE — Progress Notes (Signed)
 Physical Therapy Treatment Patient Details Name: [name] MRN: [id number] DOB: Dec 24, 1940 Today's Date: 04/29/2024   History of Present Illness 83 yo female s/p R TKA 04/27/24. Hx kidney Ca (remote)    PT Comments  Pt agreeable to therapy session. Reported no further nausea, vomiting since that one episode on yesterday. Moderate pain with activity. Reviewed exercises, gait training, and stair step training. Encouraged pt to ambulate often at home, as tolerated. Plan is for HHPT f/u.    If plan is discharge home, recommend the following: A little help with walking and/or transfers;A little help with bathing/dressing/bathroom;Assistance with cooking/housework;Assist for transportation;Help with stairs or ramp for entrance   Can travel by private vehicle        Equipment Recommendations  None recommended by PT    Recommendations for Other Services       Precautions / Restrictions Precautions Precautions: Fall Restrictions Weight Bearing Restrictions Per Provider Order: No RLE Weight Bearing Per Provider Order: Weight bearing as tolerated     Mobility  Bed Mobility Overal bed mobility: Needs Assistance Bed Mobility: Supine to Sit     Supine to sit: Contact guard, HOB elevated, Used rails     General bed mobility comments: Cues for technqiue, safety. Pt practiced using gait belt as leg lifter    Transfers Overall transfer level: Needs assistance Equipment used: Rolling walker (2 wheels) Transfers: Sit to/from Stand Sit to Stand: Contact guard assist           General transfer comment: x2 (once from bed, once from bsc placed over toilet). Cues for safety, technique, hand/LE placement. Increased time.    Ambulation/Gait Ambulation/Gait assistance: Contact guard assist Gait Distance (Feet): 75 Feet Assistive device: Rolling walker (2 wheels) Gait Pattern/deviations: Step-to pattern       General Gait Details: Cues for safety,technqiue, sequencing. Slow,  steady gait. Pt denied dizziness while ambulating in hallway. Tolerated distance well.   Stairs Stairs:  (Pt practiced 1 step on yesterday-verbally reviewed technique on today.)           Wheelchair Mobility     Tilt Bed    Modified Rankin (Stroke Patients Only)       Balance Overall balance assessment: Needs assistance         Standing balance support: Bilateral upper extremity supported, During functional activity, Reliant on assistive device for balance Standing balance-Leahy Scale: Poor                              Communication Communication Communication: No apparent difficulties  Cognition Arousal: Alert Behavior During Therapy: WFL for tasks assessed/performed   PT - Cognitive impairments: No apparent impairments                         Following commands: Intact      Cueing Cueing Techniques: Verbal cues  Exercises Total Joint Exercises Ankle Circles/Pumps: AROM, Both, 10 reps Quad Sets: Both, 10 reps Hip ABduction/ADduction: AAROM, Right, 10 reps Straight Leg Raises: AAROM, Right, 10 reps Knee Flexion: AROM, Right, 10 reps, Seated Goniometric ROM: ~10-65 degrees    General Comments        Pertinent Vitals/Pain Pain Assessment Pain Assessment: Faces Faces Pain Scale: Hurts even more Pain Location: R knee with activity Pain Descriptors / Indicators: Aching, Operative site guarding, Grimacing Pain Intervention(s): Monitored during session, Ice applied, Repositioned    Home Living  Prior Function            PT Goals (current goals can now be found in the care plan section) Progress towards PT goals: Progressing toward goals    Frequency    7X/week      PT Plan      Co-evaluation              AM-PAC PT 6 Clicks Mobility   Outcome Measure  Help needed turning from your back to your side while in a flat bed without using bedrails?: A Little Help needed moving  from lying on your back to sitting on the side of a flat bed without using bedrails?: A Little Help needed moving to and from a bed to a chair (including a wheelchair)?: A Little Help needed standing up from a chair using your arms (e.g., wheelchair or bedside chair)?: A Little Help needed to walk in hospital room?: A Little Help needed climbing 3-5 steps with a railing? : A Little 6 Click Score: 18    End of Session Equipment Utilized During Treatment: Gait belt Activity Tolerance: Patient tolerated treatment well Patient left: in chair;with call bell/phone within reach   PT Visit Diagnosis: Other abnormalities of gait and mobility (R26.89);Pain;Unsteadiness on feet (R26.81) Pain - Right/Left: Right Pain - part of body: Knee     Time: 8861-8789 PT Time Calculation (min) (ACUTE ONLY): 32 min  Charges:    $Gait Training: 8-22 mins $Therapeutic Exercise: 8-22 mins PT General Charges $$ ACUTE PT VISIT: 1 Visit                        Dannial SQUIBB, PT Acute Rehabilitation  Office: [phone]

## 2024-04-29 NOTE — Plan of Care (Signed)
   Problem: Coping: Goal: Level of anxiety will decrease Outcome: Progressing   Problem: Pain Managment: Goal: General experience of comfort will improve and/or be controlled Outcome: Progressing   Problem: Safety: Goal: Ability to remain free from injury will improve Outcome: Progressing

## 2024-04-29 NOTE — Progress Notes (Signed)
 Patient ID: [name], female   DOB: 1940-09-05, 83 y.o.   MRN: [id number] Subjective: 2 Days Post-Op Procedure(s) (LRB): ARTHROPLASTY, KNEE, TOTAL (Right)    Patient reports pain as moderate. Was having pain this morning and received meds and feeling a little better  Objective:   VITALS:   Vitals:   04/28/24 2228 04/29/24 0544  BP: (!) 184/63 (!) 156/61  Pulse: 74 78  Resp: 18 15  Temp: 99.8 F (37.7 C) 97.6 F (36.4 C)  SpO2: 95% 94%    Neurovascular intact Incision: dressing C/D/I  LABS No results for input(s): HGB, HCT, WBC, PLT in the last 72 hours.  No results for input(s): NA, K, BUN, CREATININE, GLUCOSE in the last 72 hours.  No results for input(s): LABPT, INR in the last 72 hours.   Assessment/Plan: 2 Days Post-Op Procedure(s) (LRB): ARTHROPLASTY, KNEE, TOTAL (Right)   Up with therapy Nursing can removed ACE wrap today If does well with PT can be discharged Notify Rosina Calin if DC order needed RTC in 2 weeks to see Kay Will add some toradol while inpatient

## 2024-04-29 NOTE — Anesthesia Postprocedure Evaluation (Signed)
 Anesthesia Post Note  Patient: [name]  Procedure(s) Performed: ARTHROPLASTY, KNEE, TOTAL (Right: Knee)     Patient location during evaluation: PACU Anesthesia Type: Regional and General Level of consciousness: awake and alert Pain management: pain level controlled Vital Signs Assessment: post-procedure vital signs reviewed and stable Respiratory status: spontaneous breathing, nonlabored ventilation, respiratory function stable and patient connected to nasal cannula oxygen Cardiovascular status: blood pressure returned to baseline and stable Postop Assessment: no apparent nausea or vomiting Anesthetic complications: no   No notable events documented.  Last Vitals:  Vitals:   04/29/24 0544 04/29/24 1326  BP: (!) 156/61 (!) 156/59  Pulse: 78 72  Resp: 15 17  Temp: 36.4 C 36.5 C  SpO2: 94% 98%    Last Pain:  Vitals:   04/29/24 1552  TempSrc:   PainSc: 10-Worst pain ever                 Thom JONELLE Peoples

## 2024-04-30 ENCOUNTER — Encounter (HOSPITAL_COMMUNITY): Payer: Self-pay | Admitting: Orthopedic Surgery

## 2024-05-04 ENCOUNTER — Ambulatory Visit

## 2024-05-05 ENCOUNTER — Other Ambulatory Visit: Payer: Self-pay | Admitting: Internal Medicine

## 2024-05-06 ENCOUNTER — Ambulatory Visit

## 2024-05-06 DIAGNOSIS — I63031 Cerebral infarction due to thrombosis of right carotid artery: Secondary | ICD-10-CM

## 2024-05-07 ENCOUNTER — Ambulatory Visit

## 2024-05-07 LAB — CUP PACEART REMOTE DEVICE CHECK
Date Time Interrogation Session: 20251122230608
Implantable Pulse Generator Implant Date: 20211217

## 2024-05-08 ENCOUNTER — Ambulatory Visit: Payer: Self-pay | Admitting: Cardiology

## 2024-05-08 NOTE — Progress Notes (Signed)
 Remote Loop Recorder Transmission

## 2024-05-14 NOTE — Discharge Summary (Signed)
 Patient ID: [name] MRN: [id number] DOB/AGE: 1940/12/31 83 y.o.  Admit date: 04/27/2024 Discharge date: 04/29/2024  Admission Diagnoses:  Right knee osteoarthritis  Discharge Diagnoses:  Principal Problem:   Status post total knee replacement, right   Past Medical History:  Diagnosis Date   Anemia    Arthritis    Blood transfusion without reported diagnosis    with hip fracture-83 years old   Cancer [facility]) 1983   Kidney Cancer   Chronic kidney disease    Diverticulosis    GERD (gastroesophageal reflux disease)    Personal history of kidney cancer    Stroke [facility])     Surgeries: Procedure(s): ARTHROPLASTY, KNEE, TOTAL on 04/27/2024   Consultants:   Discharged Condition: Improved  Hospital Course: [name] is an 83 y.o. female who was admitted 04/27/2024 for operative treatment ofStatus post total knee replacement, right. Patient has severe unremitting pain that affects sleep, daily activities, and work/hobbies. After pre-op clearance the patient was taken to the operating room on 04/27/2024 and underwent  Procedure(s): ARTHROPLASTY, KNEE, TOTAL.    Patient was given perioperative antibiotics:  Anti-infectives (From admission, onward)    Start     Dose/Rate Route Frequency Ordered Stop   04/27/24 2000  ceFAZolin  (ANCEF ) IVPB 2g/100 mL premix        2 g 200 mL/hr over 30 Minutes Intravenous Every 6 hours 04/27/24 1714 04/28/24 0228   04/27/24 1030  ceFAZolin  (ANCEF ) IVPB 2g/100 mL premix        2 g 200 mL/hr over 30 Minutes Intravenous On call to O.R. 04/27/24 1029 04/27/24 1331        Patient was given sequential compression devices, early ambulation, and chemoprophylaxis to prevent DVT. Patient worked with PT and was meeting their goals regarding safe ambulation and transfers.  Patient benefited maximally from hospital stay and there were no complications.    Recent vital signs: No data found.   Recent laboratory studies: No results for input(s):  WBC, HGB, HCT, PLT, NA, K, CL, CO2, BUN, CREATININE, GLUCOSE, INR, CALCIUM  in the last 72 hours.  Invalid input(s): PT, 2   Discharge Medications:   Allergies as of 04/29/2024       Reactions   Codeine Other (See Comments)   Pt unsure if really allergic to this   Wound Dressing Adhesive Other (See Comments)   Skin tears with plastic tape. TOLERATES PAPER TAPE        Medication List     TAKE these medications    acetaminophen  650 MG CR tablet Commonly known as: TYLENOL  Take 650 mg by mouth every 8 (eight) hours as needed for pain.   ascorbic acid  500 MG tablet Commonly known as: VITAMIN C  Take 500 mg by mouth in the morning.   aspirin  EC 81 MG tablet Take 1 tablet (81 mg total) by mouth 2 (two) times daily with a meal. Swallow whole. What changed: when to take this   atorvastatin  80 MG tablet Commonly known as: LIPITOR Take 1 tablet (80 mg total) by mouth daily.   cyclobenzaprine  5 MG tablet Commonly known as: FLEXERIL  TAKE ONE TABLET BY MOUTH THREE TIMES A DAY AS NEEDED FOR MUSCLE SPASMS   famotidine  20 MG tablet Commonly known as: PEPCID  Take 20 mg by mouth 2 (two) times daily.   magnesium  oxide 400 (240 Mg) MG tablet Commonly known as: MAG-OX Take 400 mg by mouth every evening.   multivitamin with minerals tablet Take 1 tablet by mouth daily.  One A Day for Women 50+   ondansetron  4 MG tablet Commonly known as: ZOFRAN  Take 1 tablet (4 mg total) by mouth every 6 (six) hours as needed for nausea.   polyethylene glycol 17 g packet Commonly known as: MIRALAX  / GLYCOLAX  Take 17 g by mouth daily.   SALONPAS PAIN RELIEF PATCH EX Place 1 patch onto the skin daily as needed (pain.).   senna-docusate 8.6-50 MG tablet Commonly known as: Senokot-S Take 1 tablet by mouth 2 (two) times daily.   traMADol  50 MG tablet Commonly known as: Ultram  Take 1 tablet (50 mg total) by mouth every 6 (six) hours as needed for severe pain (pain  score 7-10).               Discharge Care Instructions  (From admission, onward)           Start     Ordered   04/29/24 0000  Change dressing       Comments: Maintain surgical dressing until follow up in the clinic. If the edges start to pull up, may reinforce with tape. If the dressing is no longer working, may remove and cover with gauze and tape, but must keep the area dry and clean.  Call with any questions or concerns.   04/29/24 1339            Diagnostic Studies: CUP PACEART REMOTE DEVICE CHECK Result Date: 05/07/2024 ILR summary report received. Battery status OK. Normal device function. No new symptom, tachy, brady, or pause episodes. No new AF episodes. Monthly summary reports and ROV/PRN CS, CVRS   Disposition: Discharge disposition: 01-Home or Self Care       Discharge Instructions     Call MD / Call 911   Complete by: As directed    If you experience chest pain or shortness of breath, CALL 911 and be transported to the hospital emergency room.  If you develope a fever above 101 F, pus (white drainage) or increased drainage or redness at the wound, or calf pain, call your surgeon's office.   Change dressing   Complete by: As directed    Maintain surgical dressing until follow up in the clinic. If the edges start to pull up, may reinforce with tape. If the dressing is no longer working, may remove and cover with gauze and tape, but must keep the area dry and clean.  Call with any questions or concerns.   Constipation Prevention   Complete by: As directed    Drink plenty of fluids.  Prune juice may be helpful.  You may use a stool softener, such as Colace (over the counter) 100 mg twice a day.  Use MiraLax  (over the counter) for constipation as needed.   Diet - low sodium heart healthy   Complete by: As directed    Increase activity slowly as tolerated   Complete by: As directed    Weight bearing as tolerated with assist device (walker, cane, etc) as  directed, use it as long as suggested by your surgeon or therapist, typically at least 4-6 weeks.   Post-operative opioid taper instructions:   Complete by: As directed    POST-OPERATIVE OPIOID TAPER INSTRUCTIONS: It is important to wean off of your opioid medication as soon as possible. If you do not need pain medication after your surgery it is ok to stop day one. Opioids include: Codeine, Hydrocodone (Norco, Vicodin), Oxycodone (Percocet, oxycontin ) and hydromorphone amongst others.  Long term and even short term use of opiods can  cause: Increased pain response Dependence Constipation Depression Respiratory depression And more.  Withdrawal symptoms can include Flu like symptoms Nausea, vomiting And more Techniques to manage these symptoms Hydrate well Eat regular healthy meals Stay active Use relaxation techniques(deep breathing, meditating, yoga) Do Not substitute Alcohol to help with tapering If you have been on opioids for less than two weeks and do not have pain than it is ok to stop all together.  Plan to wean off of opioids This plan should start within one week post op of your joint replacement. Maintain the same interval or time between taking each dose and first decrease the dose.  Cut the total daily intake of opioids by one tablet each day Next start to increase the time between doses. The last dose that should be eliminated is the evening dose.      TED hose   Complete by: As directed    Use stockings (TED hose) for 2 weeks on both leg(s).  You may remove them at night for sleeping.        Follow-up Information     Kay Kemps, MD. Call in 2 week(s).   Specialty: Orthopedic Surgery Why: please call the office at [phone] for follow up appointment in two weeks Contact information: [address] [place] [address] [phone]                  Signed: Rosina JONELLE Calin 05/14/2024, 4:07 PM

## 2024-05-21 ENCOUNTER — Ambulatory Visit

## 2024-05-24 ENCOUNTER — Other Ambulatory Visit: Payer: Self-pay | Admitting: Family Medicine

## 2024-05-24 DIAGNOSIS — Z1231 Encounter for screening mammogram for malignant neoplasm of breast: Secondary | ICD-10-CM

## 2024-06-04 ENCOUNTER — Ambulatory Visit

## 2024-06-06 ENCOUNTER — Ambulatory Visit

## 2024-06-06 DIAGNOSIS — I63031 Cerebral infarction due to thrombosis of right carotid artery: Secondary | ICD-10-CM | POA: Diagnosis not present

## 2024-06-06 LAB — CUP PACEART REMOTE DEVICE CHECK
Date Time Interrogation Session: 20251223230428
Implantable Pulse Generator Implant Date: 20211217

## 2024-06-07 ENCOUNTER — Ambulatory Visit

## 2024-06-08 NOTE — Progress Notes (Signed)
 Remote Loop Recorder Transmission

## 2024-06-13 ENCOUNTER — Ambulatory Visit: Payer: Self-pay | Admitting: Cardiovascular Disease

## 2024-06-20 ENCOUNTER — Other Ambulatory Visit: Payer: Self-pay | Admitting: Internal Medicine

## 2024-06-20 ENCOUNTER — Ambulatory Visit
Admission: RE | Admit: 2024-06-20 | Discharge: 2024-06-20 | Disposition: A | Discharge status: Home / Self Care | Source: Ambulatory Visit | Attending: Family Medicine | Admitting: Family Medicine

## 2024-06-20 DIAGNOSIS — Z1231 Encounter for screening mammogram for malignant neoplasm of breast: Secondary | ICD-10-CM

## 2024-06-21 ENCOUNTER — Ambulatory Visit

## 2024-07-05 ENCOUNTER — Ambulatory Visit

## 2024-07-07 ENCOUNTER — Ambulatory Visit: Attending: Cardiovascular Disease

## 2024-07-07 DIAGNOSIS — I63031 Cerebral infarction due to thrombosis of right carotid artery: Secondary | ICD-10-CM

## 2024-07-09 ENCOUNTER — Ambulatory Visit

## 2024-07-09 LAB — CUP PACEART REMOTE DEVICE CHECK
Date Time Interrogation Session: 20260123230545
Implantable Pulse Generator Implant Date: 20211217

## 2024-07-10 ENCOUNTER — Ambulatory Visit: Payer: Self-pay | Admitting: Cardiovascular Disease

## 2024-07-12 NOTE — Progress Notes (Signed)
 Remote Loop Recorder Transmission

## 2024-07-23 ENCOUNTER — Ambulatory Visit

## 2024-08-05 ENCOUNTER — Ambulatory Visit

## 2024-08-07 ENCOUNTER — Ambulatory Visit

## 2024-08-09 ENCOUNTER — Ambulatory Visit

## 2024-08-23 ENCOUNTER — Ambulatory Visit

## 2024-09-07 ENCOUNTER — Ambulatory Visit

## 2024-10-08 ENCOUNTER — Ambulatory Visit

## 2025-03-06 ENCOUNTER — Ambulatory Visit: Admitting: Podiatry
